# Patient Record
Sex: Female | Born: 1970 | Race: White | Hispanic: No | Marital: Single | Smoking: Current every day smoker
Health system: Southern US, Community
[De-identification: ages and names within clinical notes are randomized; demographics above are authoritative.]

## PROBLEM LIST (undated history)

## (undated) ENCOUNTER — Emergency Department (HOSPITAL_COMMUNITY): Discharge status: Against Medical Advice | Payer: PRIVATE HEALTH INSURANCE

## (undated) ENCOUNTER — Emergency Department (HOSPITAL_COMMUNITY): Payer: Self-pay | Source: Home / Self Care

## (undated) ENCOUNTER — Emergency Department (HOSPITAL_COMMUNITY): Disposition: A | Discharge status: Home / Self Care | Payer: Self-pay

## (undated) DIAGNOSIS — N200 Calculus of kidney: Secondary | ICD-10-CM

## (undated) DIAGNOSIS — F419 Anxiety disorder, unspecified: Secondary | ICD-10-CM

## (undated) DIAGNOSIS — F32A Depression, unspecified: Secondary | ICD-10-CM

## (undated) DIAGNOSIS — F1910 Other psychoactive substance abuse, uncomplicated: Secondary | ICD-10-CM

## (undated) DIAGNOSIS — F329 Major depressive disorder, single episode, unspecified: Secondary | ICD-10-CM

## (undated) DIAGNOSIS — M419 Scoliosis, unspecified: Secondary | ICD-10-CM

## (undated) DIAGNOSIS — C569 Malignant neoplasm of unspecified ovary: Secondary | ICD-10-CM

## (undated) DIAGNOSIS — F319 Bipolar disorder, unspecified: Secondary | ICD-10-CM

## (undated) DIAGNOSIS — M1990 Unspecified osteoarthritis, unspecified site: Secondary | ICD-10-CM

## (undated) DIAGNOSIS — M797 Fibromyalgia: Secondary | ICD-10-CM

## (undated) DIAGNOSIS — C801 Malignant (primary) neoplasm, unspecified: Secondary | ICD-10-CM

## (undated) HISTORY — DX: Other psychoactive substance abuse, uncomplicated: F19.10

## (undated) HISTORY — PX: ABDOMINAL HYSTERECTOMY: SHX81

## (undated) HISTORY — PX: TUBAL LIGATION: SHX77

---

## 1997-05-21 ENCOUNTER — Inpatient Hospital Stay (HOSPITAL_COMMUNITY): Admission: AD | Admit: 1997-05-21 | Discharge: 1997-05-21 | Payer: Self-pay | Admitting: *Deleted

## 1997-09-04 ENCOUNTER — Inpatient Hospital Stay (HOSPITAL_COMMUNITY): Admission: AD | Admit: 1997-09-04 | Discharge: 1997-09-04 | Payer: Self-pay | Admitting: *Deleted

## 1997-10-19 ENCOUNTER — Inpatient Hospital Stay (HOSPITAL_COMMUNITY): Admission: AD | Admit: 1997-10-19 | Discharge: 1997-10-23 | Payer: Self-pay | Admitting: Obstetrics & Gynecology

## 1997-11-16 ENCOUNTER — Encounter: Admission: RE | Admit: 1997-11-16 | Discharge: 1998-02-14 | Payer: Self-pay | Admitting: Obstetrics

## 1997-12-01 ENCOUNTER — Inpatient Hospital Stay (HOSPITAL_COMMUNITY): Admission: AD | Admit: 1997-12-01 | Discharge: 1997-12-01 | Payer: Self-pay | Admitting: *Deleted

## 1997-12-15 ENCOUNTER — Inpatient Hospital Stay (HOSPITAL_COMMUNITY): Admission: AD | Admit: 1997-12-15 | Discharge: 1997-12-15 | Payer: Self-pay | Admitting: *Deleted

## 1997-12-15 ENCOUNTER — Inpatient Hospital Stay (HOSPITAL_COMMUNITY): Admission: AD | Admit: 1997-12-15 | Discharge: 1997-12-15 | Payer: Self-pay | Admitting: Obstetrics

## 1997-12-21 ENCOUNTER — Inpatient Hospital Stay (HOSPITAL_COMMUNITY): Admission: AD | Admit: 1997-12-21 | Discharge: 1997-12-21 | Payer: Self-pay | Admitting: Obstetrics

## 1997-12-27 ENCOUNTER — Inpatient Hospital Stay (HOSPITAL_COMMUNITY): Admission: AD | Admit: 1997-12-27 | Discharge: 1997-12-29 | Payer: Self-pay | Admitting: *Deleted

## 1999-08-25 ENCOUNTER — Inpatient Hospital Stay (HOSPITAL_COMMUNITY): Admission: AD | Admit: 1999-08-25 | Discharge: 1999-08-25 | Payer: Self-pay | Admitting: *Deleted

## 2002-08-06 ENCOUNTER — Emergency Department (HOSPITAL_COMMUNITY): Admission: EM | Admit: 2002-08-06 | Discharge: 2002-08-06 | Payer: Self-pay | Admitting: Emergency Medicine

## 2005-07-11 ENCOUNTER — Emergency Department (HOSPITAL_COMMUNITY): Admission: EM | Admit: 2005-07-11 | Discharge: 2005-07-11 | Payer: Self-pay | Admitting: Emergency Medicine

## 2005-08-27 ENCOUNTER — Encounter: Admission: RE | Admit: 2005-08-27 | Discharge: 2005-08-27 | Payer: Self-pay | Admitting: Orthopedic Surgery

## 2005-11-28 ENCOUNTER — Emergency Department (HOSPITAL_COMMUNITY): Admission: EM | Admit: 2005-11-28 | Discharge: 2005-11-28 | Payer: Self-pay | Admitting: Emergency Medicine

## 2006-02-09 ENCOUNTER — Ambulatory Visit: Payer: Self-pay | Admitting: Pain Medicine

## 2006-02-18 ENCOUNTER — Ambulatory Visit: Payer: Self-pay | Admitting: Pain Medicine

## 2006-03-11 ENCOUNTER — Ambulatory Visit: Payer: Self-pay | Admitting: Pain Medicine

## 2006-03-24 ENCOUNTER — Ambulatory Visit: Payer: Self-pay | Admitting: Pain Medicine

## 2006-05-20 ENCOUNTER — Inpatient Hospital Stay (HOSPITAL_COMMUNITY): Admission: AD | Admit: 2006-05-20 | Discharge: 2006-05-21 | Payer: Self-pay | Admitting: Psychiatry

## 2006-05-20 ENCOUNTER — Ambulatory Visit: Payer: Self-pay | Admitting: Psychiatry

## 2006-10-25 ENCOUNTER — Emergency Department (HOSPITAL_COMMUNITY): Admission: EM | Admit: 2006-10-25 | Discharge: 2006-10-25 | Payer: Self-pay | Admitting: Emergency Medicine

## 2006-10-27 ENCOUNTER — Emergency Department (HOSPITAL_COMMUNITY): Admission: EM | Admit: 2006-10-27 | Discharge: 2006-10-27 | Payer: Self-pay | Admitting: Emergency Medicine

## 2006-10-29 ENCOUNTER — Emergency Department (HOSPITAL_COMMUNITY): Admission: EM | Admit: 2006-10-29 | Discharge: 2006-10-29 | Payer: Self-pay | Admitting: Emergency Medicine

## 2006-11-15 ENCOUNTER — Emergency Department (HOSPITAL_COMMUNITY): Admission: EM | Admit: 2006-11-15 | Discharge: 2006-11-15 | Payer: Self-pay | Admitting: Family Medicine

## 2007-01-03 ENCOUNTER — Emergency Department (HOSPITAL_COMMUNITY): Admission: EM | Admit: 2007-01-03 | Discharge: 2007-01-03 | Payer: Self-pay | Admitting: Family Medicine

## 2007-02-19 ENCOUNTER — Emergency Department (HOSPITAL_COMMUNITY): Admission: EM | Admit: 2007-02-19 | Discharge: 2007-02-19 | Payer: Self-pay | Admitting: Emergency Medicine

## 2007-03-12 ENCOUNTER — Encounter: Admission: RE | Admit: 2007-03-12 | Discharge: 2007-03-12 | Payer: Self-pay | Admitting: Sports Medicine

## 2007-05-06 ENCOUNTER — Emergency Department (HOSPITAL_COMMUNITY): Admission: EM | Admit: 2007-05-06 | Discharge: 2007-05-06 | Payer: Self-pay | Admitting: Emergency Medicine

## 2007-06-13 ENCOUNTER — Emergency Department (HOSPITAL_COMMUNITY): Admission: EM | Admit: 2007-06-13 | Discharge: 2007-06-13 | Payer: Self-pay | Admitting: Family Medicine

## 2007-09-29 ENCOUNTER — Emergency Department (HOSPITAL_COMMUNITY): Admission: EM | Admit: 2007-09-29 | Discharge: 2007-09-29 | Payer: Self-pay | Admitting: Emergency Medicine

## 2007-10-04 ENCOUNTER — Emergency Department (HOSPITAL_COMMUNITY): Admission: EM | Admit: 2007-10-04 | Discharge: 2007-10-04 | Payer: Self-pay | Admitting: Emergency Medicine

## 2007-10-11 ENCOUNTER — Emergency Department (HOSPITAL_COMMUNITY): Admission: EM | Admit: 2007-10-11 | Discharge: 2007-10-11 | Payer: Self-pay | Admitting: Emergency Medicine

## 2007-10-17 ENCOUNTER — Emergency Department (HOSPITAL_COMMUNITY): Admission: EM | Admit: 2007-10-17 | Discharge: 2007-10-17 | Payer: Self-pay | Admitting: Emergency Medicine

## 2007-11-10 ENCOUNTER — Emergency Department (HOSPITAL_COMMUNITY): Admission: EM | Admit: 2007-11-10 | Discharge: 2007-11-10 | Payer: Self-pay | Admitting: Emergency Medicine

## 2007-11-16 ENCOUNTER — Other Ambulatory Visit: Payer: Self-pay

## 2007-11-17 ENCOUNTER — Inpatient Hospital Stay (HOSPITAL_COMMUNITY): Admission: EM | Admit: 2007-11-17 | Discharge: 2007-11-18 | Payer: Self-pay | Admitting: Psychiatry

## 2007-11-17 ENCOUNTER — Ambulatory Visit: Payer: Self-pay | Admitting: Psychiatry

## 2007-12-03 ENCOUNTER — Emergency Department (HOSPITAL_COMMUNITY): Admission: EM | Admit: 2007-12-03 | Discharge: 2007-12-03 | Payer: Self-pay | Admitting: Family Medicine

## 2007-12-15 ENCOUNTER — Emergency Department (HOSPITAL_COMMUNITY): Admission: EM | Admit: 2007-12-15 | Discharge: 2007-12-15 | Payer: Self-pay | Admitting: Family Medicine

## 2007-12-16 ENCOUNTER — Emergency Department (HOSPITAL_COMMUNITY): Admission: EM | Admit: 2007-12-16 | Discharge: 2007-12-17 | Payer: Self-pay | Admitting: Internal Medicine

## 2007-12-18 ENCOUNTER — Emergency Department (HOSPITAL_COMMUNITY): Admission: EM | Admit: 2007-12-18 | Discharge: 2007-12-18 | Payer: Self-pay | Admitting: Emergency Medicine

## 2007-12-23 ENCOUNTER — Emergency Department (HOSPITAL_COMMUNITY): Admission: EM | Admit: 2007-12-23 | Discharge: 2007-12-23 | Payer: Self-pay | Admitting: Emergency Medicine

## 2008-01-18 ENCOUNTER — Emergency Department (HOSPITAL_COMMUNITY): Admission: EM | Admit: 2008-01-18 | Discharge: 2008-01-18 | Payer: Self-pay | Admitting: Emergency Medicine

## 2008-01-23 ENCOUNTER — Ambulatory Visit: Payer: Self-pay | Admitting: Cardiovascular Disease

## 2008-01-23 ENCOUNTER — Inpatient Hospital Stay (HOSPITAL_COMMUNITY): Admission: EM | Admit: 2008-01-23 | Discharge: 2008-01-27 | Payer: Self-pay | Admitting: Emergency Medicine

## 2008-01-24 ENCOUNTER — Encounter (INDEPENDENT_AMBULATORY_CARE_PROVIDER_SITE_OTHER): Payer: Self-pay | Admitting: Internal Medicine

## 2008-03-27 ENCOUNTER — Emergency Department (HOSPITAL_COMMUNITY): Admission: EM | Admit: 2008-03-27 | Discharge: 2008-03-27 | Payer: Self-pay | Admitting: Emergency Medicine

## 2008-04-24 ENCOUNTER — Emergency Department (HOSPITAL_COMMUNITY): Admission: EM | Admit: 2008-04-24 | Discharge: 2008-04-24 | Payer: Self-pay | Admitting: Emergency Medicine

## 2008-04-30 ENCOUNTER — Emergency Department (HOSPITAL_COMMUNITY): Admission: EM | Admit: 2008-04-30 | Discharge: 2008-04-30 | Payer: Self-pay | Admitting: Emergency Medicine

## 2008-05-12 ENCOUNTER — Emergency Department (HOSPITAL_COMMUNITY): Admission: EM | Admit: 2008-05-12 | Discharge: 2008-05-12 | Payer: Self-pay | Admitting: Emergency Medicine

## 2008-05-23 ENCOUNTER — Ambulatory Visit: Payer: Self-pay | Admitting: Surgery

## 2008-05-23 ENCOUNTER — Encounter (INDEPENDENT_AMBULATORY_CARE_PROVIDER_SITE_OTHER): Payer: Self-pay | Admitting: Emergency Medicine

## 2008-05-23 ENCOUNTER — Emergency Department (HOSPITAL_COMMUNITY): Admission: EM | Admit: 2008-05-23 | Discharge: 2008-05-24 | Payer: Self-pay | Admitting: Emergency Medicine

## 2008-06-05 ENCOUNTER — Other Ambulatory Visit: Payer: Self-pay | Admitting: Emergency Medicine

## 2008-06-05 ENCOUNTER — Ambulatory Visit: Payer: Self-pay | Admitting: Psychiatry

## 2008-06-06 ENCOUNTER — Inpatient Hospital Stay (HOSPITAL_COMMUNITY): Admission: RE | Admit: 2008-06-06 | Discharge: 2008-06-14 | Payer: Self-pay | Admitting: Psychiatry

## 2008-07-31 ENCOUNTER — Ambulatory Visit: Payer: Self-pay | Admitting: Cardiology

## 2008-07-31 ENCOUNTER — Inpatient Hospital Stay (HOSPITAL_COMMUNITY): Admission: EM | Admit: 2008-07-31 | Discharge: 2008-08-06 | Payer: Self-pay | Admitting: Emergency Medicine

## 2008-08-04 ENCOUNTER — Ambulatory Visit: Payer: Self-pay | Admitting: *Deleted

## 2008-08-06 ENCOUNTER — Inpatient Hospital Stay (HOSPITAL_COMMUNITY): Admission: RE | Admit: 2008-08-06 | Discharge: 2008-08-08 | Payer: Self-pay | Admitting: *Deleted

## 2008-08-14 ENCOUNTER — Emergency Department: Payer: Self-pay | Admitting: Internal Medicine

## 2008-11-04 ENCOUNTER — Emergency Department (HOSPITAL_COMMUNITY): Admission: EM | Admit: 2008-11-04 | Discharge: 2008-11-04 | Payer: Self-pay | Admitting: Emergency Medicine

## 2008-12-20 ENCOUNTER — Emergency Department (HOSPITAL_COMMUNITY): Admission: EM | Admit: 2008-12-20 | Discharge: 2008-12-20 | Payer: Self-pay | Admitting: Family Medicine

## 2008-12-27 ENCOUNTER — Emergency Department (HOSPITAL_COMMUNITY): Admission: EM | Admit: 2008-12-27 | Discharge: 2008-12-27 | Payer: Self-pay | Admitting: Emergency Medicine

## 2009-01-07 ENCOUNTER — Emergency Department (HOSPITAL_COMMUNITY): Admission: EM | Admit: 2009-01-07 | Discharge: 2009-01-07 | Payer: Self-pay | Admitting: Emergency Medicine

## 2009-03-06 ENCOUNTER — Emergency Department (HOSPITAL_COMMUNITY): Admission: EM | Admit: 2009-03-06 | Discharge: 2009-03-06 | Payer: Self-pay | Admitting: Emergency Medicine

## 2009-11-09 ENCOUNTER — Emergency Department (HOSPITAL_COMMUNITY): Admission: EM | Admit: 2009-11-09 | Discharge: 2009-11-09 | Payer: Self-pay | Admitting: Emergency Medicine

## 2009-11-10 ENCOUNTER — Emergency Department (HOSPITAL_COMMUNITY): Admission: EM | Admit: 2009-11-10 | Discharge: 2009-11-10 | Payer: Self-pay | Admitting: Emergency Medicine

## 2009-11-11 ENCOUNTER — Emergency Department (HOSPITAL_COMMUNITY): Admission: EM | Admit: 2009-11-11 | Discharge: 2009-11-12 | Payer: Self-pay | Admitting: Emergency Medicine

## 2009-11-11 ENCOUNTER — Emergency Department (HOSPITAL_COMMUNITY): Admission: EM | Admit: 2009-11-11 | Discharge: 2009-11-11 | Payer: Self-pay | Admitting: Emergency Medicine

## 2010-01-27 ENCOUNTER — Emergency Department (HOSPITAL_COMMUNITY): Admission: EM | Admit: 2010-01-27 | Discharge: 2010-01-27 | Payer: Self-pay | Admitting: Emergency Medicine

## 2010-03-31 ENCOUNTER — Encounter: Payer: Self-pay | Admitting: Orthopedic Surgery

## 2010-03-31 ENCOUNTER — Encounter: Payer: Self-pay | Admitting: Sports Medicine

## 2010-05-21 LAB — COMPREHENSIVE METABOLIC PANEL
AST: 25 U/L (ref 0–37)
BUN: 10 mg/dL (ref 6–23)
CO2: 28 mEq/L (ref 19–32)
Chloride: 107 mEq/L (ref 96–112)
Creatinine, Ser: 0.74 mg/dL (ref 0.4–1.2)
GFR calc non Af Amer: >60 mL/min (ref >60)
Total Bilirubin: 0.3 mg/dL (ref 0.3–1.2)

## 2010-05-21 LAB — DIFFERENTIAL
Basophils Absolute: 0 10*3/uL (ref 0.0–0.1)
Basophils Relative: 0 % (ref 0–1)
Eosinophils Absolute: 0.1 10*3/uL (ref 0.0–0.7)
Monocytes Absolute: 0.4 10*3/uL (ref 0.1–1.0)
Neutrophils Relative %: 64 % (ref 43–77)

## 2010-05-21 LAB — URINALYSIS, ROUTINE W REFLEX MICROSCOPIC
Ketones, ur: NEGATIVE mg/dL
Protein, ur: 30 mg/dL — AB
Urobilinogen, UA: 0.2 mg/dL (ref 0.0–1.0)

## 2010-05-21 LAB — CBC
HCT: 41.3 % (ref 36.0–46.0)
Hemoglobin: 14.6 g/dL (ref 12.0–15.0)
MCH: 32.9 pg (ref 26.0–34.0)
MCV: 93 fL (ref 78.0–100.0)
RBC: 4.44 MIL/uL (ref 3.87–5.11)

## 2010-05-21 LAB — URINE CULTURE
Colony Count: >=100000
Culture  Setup Time: 201111202357

## 2010-05-21 LAB — URINE MICROSCOPIC-ADD ON

## 2010-05-21 LAB — POCT PREGNANCY, URINE: Preg Test, Ur: NEGATIVE

## 2010-05-23 LAB — URINALYSIS, ROUTINE W REFLEX MICROSCOPIC
Bilirubin Urine: NEGATIVE
Glucose, UA: NEGATIVE mg/dL
Glucose, UA: NEGATIVE mg/dL
Ketones, ur: NEGATIVE mg/dL
Nitrite: NEGATIVE
Nitrite: POSITIVE — AB
Protein, ur: 30 mg/dL — AB
Protein, ur: NEGATIVE mg/dL
Protein, ur: NEGATIVE mg/dL
Specific Gravity, Urine: 1.012 (ref 1.005–1.030)
Specific Gravity, Urine: 1.018 (ref 1.005–1.030)
Urobilinogen, UA: 0.2 mg/dL (ref 0.0–1.0)
Urobilinogen, UA: 0.2 mg/dL (ref 0.0–1.0)
Urobilinogen, UA: 0.2 mg/dL (ref 0.0–1.0)
pH: 5 (ref 5.0–8.0)
pH: 6 (ref 5.0–8.0)

## 2010-05-23 LAB — CBC
HCT: 37.5 % (ref 36.0–46.0)
HCT: 38.6 % (ref 36.0–46.0)
Hemoglobin: 12.8 g/dL (ref 12.0–15.0)
Hemoglobin: 13.8 g/dL (ref 12.0–15.0)
MCH: 31.5 pg (ref 26.0–34.0)
MCH: 32.6 pg (ref 26.0–34.0)
MCH: 32.7 pg (ref 26.0–34.0)
MCHC: 33.2 g/dL (ref 30.0–36.0)
MCHC: 34.7 g/dL (ref 30.0–36.0)
MCV: 93.7 fL (ref 78.0–100.0)
MCV: 95.1 fL (ref 78.0–100.0)
Platelets: 278 10*3/uL (ref 150–400)
Platelets: 285 10*3/uL (ref 150–400)
RBC: 4.21 MIL/uL (ref 3.87–5.11)
RDW: 11.9 % (ref 11.5–15.5)
RDW: 11.9 % (ref 11.5–15.5)
WBC: 10.7 10*3/uL — ABNORMAL HIGH (ref 4.0–10.5)

## 2010-05-23 LAB — POCT I-STAT, CHEM 8
Chloride: 106 mEq/L (ref 96–112)
Creatinine, Ser: 1.1 mg/dL (ref 0.4–1.2)
HCT: 45 % (ref 36.0–46.0)
Hemoglobin: 15.3 g/dL — ABNORMAL HIGH (ref 12.0–15.0)
Potassium: 3.4 mEq/L — ABNORMAL LOW (ref 3.5–5.1)
Sodium: 142 mEq/L (ref 135–145)

## 2010-05-23 LAB — DIFFERENTIAL
Basophils Absolute: 0.1 10*3/uL (ref 0.0–0.1)
Basophils Relative: 1 % (ref 0–1)
Eosinophils Absolute: 0.1 10*3/uL (ref 0.0–0.7)
Eosinophils Absolute: 0.2 10*3/uL (ref 0.0–0.7)
Lymphocytes Relative: 20 % (ref 12–46)
Lymphs Abs: 2.3 10*3/uL (ref 0.7–4.0)
Lymphs Abs: 3.5 10*3/uL (ref 0.7–4.0)
Monocytes Relative: 6 % (ref 3–12)
Monocytes Relative: 8 % (ref 3–12)
Neutro Abs: 3.7 10*3/uL (ref 1.7–7.7)
Neutro Abs: 7.4 10*3/uL (ref 1.7–7.7)
Neutro Abs: 8.1 10*3/uL — ABNORMAL HIGH (ref 1.7–7.7)
Neutrophils Relative %: 46 % (ref 43–77)
Neutrophils Relative %: 69 % (ref 43–77)
Neutrophils Relative %: 73 % (ref 43–77)

## 2010-05-23 LAB — URINE MICROSCOPIC-ADD ON

## 2010-05-23 LAB — POCT CARDIAC MARKERS
CKMB, poc: <1 ng/mL — ABNORMAL LOW (ref 1.0–8.0)
Myoglobin, poc: 58.9 ng/mL (ref 12–200)

## 2010-05-23 LAB — COMPREHENSIVE METABOLIC PANEL
Alkaline Phosphatase: 48 U/L (ref 39–117)
BUN: 13 mg/dL (ref 6–23)
BUN: 5 mg/dL — ABNORMAL LOW (ref 6–23)
CO2: 23 mEq/L (ref 19–32)
Calcium: 8.5 mg/dL (ref 8.4–10.5)
Calcium: 8.7 mg/dL (ref 8.4–10.5)
Creatinine, Ser: 0.83 mg/dL (ref 0.4–1.2)
Creatinine, Ser: 0.84 mg/dL (ref 0.4–1.2)
GFR calc non Af Amer: >60 mL/min (ref >60)
Glucose, Bld: 105 mg/dL — ABNORMAL HIGH (ref 70–99)
Glucose, Bld: 81 mg/dL (ref 70–99)
Potassium: 3.6 mEq/L (ref 3.5–5.1)
Total Protein: 6.4 g/dL (ref 6.0–8.3)
Total Protein: 7.3 g/dL (ref 6.0–8.3)

## 2010-05-23 LAB — RAPID URINE DRUG SCREEN, HOSP PERFORMED
Amphetamines: NOT DETECTED
Benzodiazepines: POSITIVE — AB
Cocaine: POSITIVE — AB
Opiates: POSITIVE — AB
Tetrahydrocannabinol: NOT DETECTED

## 2010-05-23 LAB — URINE CULTURE: Culture: NO GROWTH

## 2010-05-23 LAB — TRICYCLICS SCREEN, URINE: TCA Scrn: NOT DETECTED

## 2010-05-23 LAB — PREGNANCY, URINE: Preg Test, Ur: NEGATIVE

## 2010-06-10 LAB — URINALYSIS, ROUTINE W REFLEX MICROSCOPIC
Nitrite: NEGATIVE
Specific Gravity, Urine: 1.025 (ref 1.005–1.030)
Urobilinogen, UA: 0.2 mg/dL (ref 0.0–1.0)

## 2010-06-10 LAB — DIFFERENTIAL
Basophils Absolute: 0 10*3/uL (ref 0.0–0.1)
Lymphocytes Relative: 17 % (ref 12–46)
Neutro Abs: 3.8 10*3/uL (ref 1.7–7.7)
Neutrophils Relative %: 70 % (ref 43–77)

## 2010-06-10 LAB — COMPREHENSIVE METABOLIC PANEL
BUN: 12 mg/dL (ref 6–23)
CO2: 23 mEq/L (ref 19–32)
Chloride: 105 mEq/L (ref 96–112)
Creatinine, Ser: 0.92 mg/dL (ref 0.4–1.2)
GFR calc non Af Amer: >60 mL/min (ref >60)
Total Bilirubin: 0.3 mg/dL (ref 0.3–1.2)

## 2010-06-10 LAB — URINE MICROSCOPIC-ADD ON

## 2010-06-10 LAB — CBC
HCT: 42.7 % (ref 36.0–46.0)
MCV: 92.6 fL (ref 78.0–100.0)
RBC: 4.61 MIL/uL (ref 3.87–5.11)
WBC: 5.4 10*3/uL (ref 4.0–10.5)

## 2010-06-10 LAB — LIPASE, BLOOD: Lipase: 28 U/L (ref 11–59)

## 2010-06-13 LAB — URINE MICROSCOPIC-ADD ON

## 2010-06-13 LAB — URINALYSIS, ROUTINE W REFLEX MICROSCOPIC
Glucose, UA: NEGATIVE mg/dL
Ketones, ur: 15 mg/dL — AB
Leukocytes, UA: NEGATIVE
pH: 6 (ref 5.0–8.0)

## 2010-06-13 LAB — COMPREHENSIVE METABOLIC PANEL
ALT: 18 U/L (ref 0–35)
AST: 26 U/L (ref 0–37)
Albumin: 4.6 g/dL (ref 3.5–5.2)
Alkaline Phosphatase: 77 U/L (ref 39–117)
GFR calc Af Amer: >60 mL/min (ref >60)
Potassium: 3.9 mEq/L (ref 3.5–5.1)
Sodium: 137 mEq/L (ref 135–145)
Total Protein: 8.7 g/dL — ABNORMAL HIGH (ref 6.0–8.3)

## 2010-06-13 LAB — DIFFERENTIAL
Basophils Relative: 1 % (ref 0–1)
Eosinophils Absolute: 0 10*3/uL (ref 0.0–0.7)
Monocytes Absolute: 0.3 10*3/uL (ref 0.1–1.0)
Neutro Abs: 6.5 10*3/uL (ref 1.7–7.7)

## 2010-06-13 LAB — CBC
Hemoglobin: 16.4 g/dL — ABNORMAL HIGH (ref 12.0–15.0)
Platelets: 262 10*3/uL (ref 150–400)
RDW: 12.4 % (ref 11.5–15.5)

## 2010-06-15 LAB — URINALYSIS, ROUTINE W REFLEX MICROSCOPIC
Bilirubin Urine: NEGATIVE
Glucose, UA: NEGATIVE mg/dL
Ketones, ur: NEGATIVE mg/dL
Specific Gravity, Urine: 1.022 (ref 1.005–1.030)
pH: 8 (ref 5.0–8.0)

## 2010-06-15 LAB — POCT PREGNANCY, URINE: Preg Test, Ur: NEGATIVE

## 2010-06-18 LAB — COMPREHENSIVE METABOLIC PANEL
ALT: 13 U/L (ref 0–35)
AST: 18 U/L (ref 0–37)
Albumin: 4.3 g/dL (ref 3.5–5.2)
Calcium: 9.4 mg/dL (ref 8.4–10.5)
GFR calc Af Amer: >60 mL/min (ref >60)
Sodium: 139 mEq/L (ref 135–145)
Total Protein: 7.1 g/dL (ref 6.0–8.3)

## 2010-06-18 LAB — CBC
HCT: 36.8 % (ref 36.0–46.0)
MCHC: 34.2 g/dL (ref 30.0–36.0)
MCHC: 35.8 g/dL (ref 30.0–36.0)
MCV: 93.8 fL (ref 78.0–100.0)
MCV: 94.5 fL (ref 78.0–100.0)
Platelets: 187 10*3/uL (ref 150–400)
Platelets: 277 10*3/uL (ref 150–400)
RBC: 3.62 MIL/uL — ABNORMAL LOW (ref 3.87–5.11)
RBC: 3.9 MIL/uL (ref 3.87–5.11)
RBC: 4.31 MIL/uL (ref 3.87–5.11)
RDW: 12.4 % (ref 11.5–15.5)
WBC: 7.5 10*3/uL (ref 4.0–10.5)
WBC: 8.8 10*3/uL (ref 4.0–10.5)

## 2010-06-18 LAB — CARDIAC PANEL(CRET KIN+CKTOT+MB+TROPI)
CK, MB: 0.4 ng/mL (ref 0.3–4.0)
CK, MB: 5.2 ng/mL — ABNORMAL HIGH (ref 0.3–4.0)
Relative Index: 0.6 (ref 0.0–2.5)
Relative Index: 0.8 (ref 0.0–2.5)
Relative Index: INVALID (ref 0.0–2.5)
Relative Index: INVALID (ref 0.0–2.5)
Total CK: 889 U/L — ABNORMAL HIGH (ref 7–177)
Total CK: 989 U/L — ABNORMAL HIGH (ref 7–177)
Troponin I: 0.01 ng/mL (ref 0.00–0.06)
Troponin I: <0.01 ng/mL (ref 0.00–0.06)
Troponin I: <0.01 ng/mL (ref 0.00–0.06)
Troponin I: <0.01 ng/mL (ref 0.00–0.06)
Troponin I: <0.01 ng/mL (ref 0.00–0.06)

## 2010-06-18 LAB — DIFFERENTIAL
Eosinophils Absolute: 0 10*3/uL (ref 0.0–0.7)
Eosinophils Relative: 0 % (ref 0–5)
Lymphocytes Relative: 27 % (ref 12–46)
Lymphs Abs: 2.5 10*3/uL (ref 0.7–4.0)
Monocytes Relative: 3 % (ref 3–12)

## 2010-06-18 LAB — BASIC METABOLIC PANEL
BUN: 11 mg/dL (ref 6–23)
BUN: 9 mg/dL (ref 6–23)
CO2: 28 mEq/L (ref 19–32)
Calcium: 8.5 mg/dL (ref 8.4–10.5)
Calcium: 8.8 mg/dL (ref 8.4–10.5)
Creatinine, Ser: 0.45 mg/dL (ref 0.4–1.2)
Creatinine, Ser: 0.79 mg/dL (ref 0.4–1.2)
GFR calc Af Amer: >60 mL/min (ref >60)
GFR calc Af Amer: >60 mL/min (ref >60)
GFR calc non Af Amer: >60 mL/min (ref >60)

## 2010-06-18 LAB — URINALYSIS, ROUTINE W REFLEX MICROSCOPIC
Bilirubin Urine: NEGATIVE
Ketones, ur: NEGATIVE mg/dL
Nitrite: NEGATIVE
pH: 7 (ref 5.0–8.0)

## 2010-06-18 LAB — LIPID PANEL
HDL: 40 mg/dL (ref >39)
Triglycerides: 57 mg/dL (ref <150)
VLDL: 11 mg/dL (ref 0–40)

## 2010-06-18 LAB — ACETAMINOPHEN LEVEL: Acetaminophen (Tylenol), Serum: <10 ug/mL — ABNORMAL LOW (ref 10–30)

## 2010-06-18 LAB — POCT CARDIAC MARKERS: Troponin i, poc: <0.05 ng/mL (ref 0.00–0.09)

## 2010-06-18 LAB — RAPID URINE DRUG SCREEN, HOSP PERFORMED
Amphetamines: NOT DETECTED
Cocaine: POSITIVE — AB
Tetrahydrocannabinol: POSITIVE — AB

## 2010-06-18 LAB — TSH: TSH: 1.627 u[IU]/mL (ref 0.350–4.500)

## 2010-06-19 LAB — ELECTROLYTE PANEL
CO2: 32 mEq/L (ref 19–32)
Potassium: 4.1 mEq/L (ref 3.5–5.1)
Sodium: 138 mEq/L (ref 135–145)

## 2010-06-20 LAB — D-DIMER, QUANTITATIVE: D-Dimer, Quant: <0.22 ug/mL-FEU (ref 0.00–0.48)

## 2010-06-20 LAB — COMPREHENSIVE METABOLIC PANEL
ALT: 11 U/L (ref 0–35)
AST: 15 U/L (ref 0–37)
Albumin: 4.1 g/dL (ref 3.5–5.2)
CO2: 30 mEq/L (ref 19–32)
Calcium: 9.1 mg/dL (ref 8.4–10.5)
Chloride: 107 mEq/L (ref 96–112)
GFR calc Af Amer: >60 mL/min (ref >60)
GFR calc non Af Amer: >60 mL/min (ref >60)
Sodium: 144 mEq/L (ref 135–145)
Total Bilirubin: 0.5 mg/dL (ref 0.3–1.2)

## 2010-06-20 LAB — DIFFERENTIAL
Basophils Absolute: 0 10*3/uL (ref 0.0–0.1)
Eosinophils Absolute: 0.1 10*3/uL (ref 0.0–0.7)
Eosinophils Absolute: 0.1 10*3/uL (ref 0.0–0.7)
Eosinophils Relative: 1 % (ref 0–5)
Eosinophils Relative: 1 % (ref 0–5)
Lymphocytes Relative: 44 % (ref 12–46)
Lymphs Abs: 2.7 10*3/uL (ref 0.7–4.0)
Monocytes Absolute: 0.6 10*3/uL (ref 0.1–1.0)
Neutrophils Relative %: 49 % (ref 43–77)

## 2010-06-20 LAB — TYPE AND SCREEN
ABO/RH(D): O POS
Antibody Screen: NEGATIVE

## 2010-06-20 LAB — CBC
HCT: 37.9 % (ref 36.0–46.0)
Platelets: 200 10*3/uL (ref 150–400)
Platelets: 278 10*3/uL (ref 150–400)
RBC: 4.17 MIL/uL (ref 3.87–5.11)
RDW: 12.2 % (ref 11.5–15.5)
WBC: 6.2 10*3/uL (ref 4.0–10.5)
WBC: 10.3 10*3/uL (ref 4.0–10.5)

## 2010-06-20 LAB — POCT I-STAT, CHEM 8
BUN: 18 mg/dL (ref 6–23)
Calcium, Ion: 1.2 mmol/L (ref 1.12–1.32)
Chloride: 107 mEq/L (ref 96–112)
HCT: 40 % (ref 36.0–46.0)
Potassium: 3.3 mEq/L — ABNORMAL LOW (ref 3.5–5.1)
Sodium: 141 mEq/L (ref 135–145)

## 2010-06-20 LAB — RAPID URINE DRUG SCREEN, HOSP PERFORMED: Tetrahydrocannabinol: POSITIVE — AB

## 2010-06-20 LAB — SALICYLATE LEVEL: Salicylate Lvl: <4 mg/dL (ref 2.8–20.0)

## 2010-06-20 LAB — GLUCOSE, CAPILLARY

## 2010-06-20 LAB — PROTIME-INR
INR: 1.1 (ref 0.00–1.49)
Prothrombin Time: 14.6 seconds (ref 11.6–15.2)

## 2010-06-20 LAB — ACETAMINOPHEN LEVEL: Acetaminophen (Tylenol), Serum: <10 ug/mL — ABNORMAL LOW (ref 10–30)

## 2010-06-24 LAB — CBC
HCT: 43.8 % (ref 36.0–46.0)
Hemoglobin: 14.7 g/dL (ref 12.0–15.0)
MCHC: 33.6 g/dL (ref 30.0–36.0)
MCV: 93.4 fL (ref 78.0–100.0)
Platelets: 249 10*3/uL (ref 150–400)
RBC: 4.69 MIL/uL (ref 3.87–5.11)
RDW: 12.9 % (ref 11.5–15.5)
WBC: 7.3 10*3/uL (ref 4.0–10.5)

## 2010-06-24 LAB — URINALYSIS, ROUTINE W REFLEX MICROSCOPIC
Bilirubin Urine: NEGATIVE
Nitrite: NEGATIVE
Specific Gravity, Urine: 1.014 (ref 1.005–1.030)
Urobilinogen, UA: 0.2 mg/dL (ref 0.0–1.0)

## 2010-06-24 LAB — DIFFERENTIAL
Basophils Relative: 0 % (ref 0–1)
Eosinophils Absolute: 0 10*3/uL (ref 0.0–0.7)
Neutrophils Relative %: 62 % (ref 43–77)

## 2010-06-24 LAB — POCT I-STAT, CHEM 8
Glucose, Bld: 107 mg/dL — ABNORMAL HIGH (ref 70–99)
HCT: 45 % (ref 36.0–46.0)
Hemoglobin: 15.3 g/dL — ABNORMAL HIGH (ref 12.0–15.0)
Potassium: 3.1 mEq/L — ABNORMAL LOW (ref 3.5–5.1)
Sodium: 141 mEq/L (ref 135–145)

## 2010-06-24 LAB — URINE MICROSCOPIC-ADD ON

## 2010-06-24 LAB — POCT PREGNANCY, URINE: Preg Test, Ur: NEGATIVE

## 2010-06-25 LAB — POCT I-STAT, CHEM 8
BUN: 10 mg/dL (ref 6–23)
Calcium, Ion: 1.15 mmol/L (ref 1.12–1.32)
Chloride: 103 mEq/L (ref 96–112)
Potassium: 3.9 mEq/L (ref 3.5–5.1)
Sodium: 142 mEq/L (ref 135–145)

## 2010-06-25 LAB — POCT PREGNANCY, URINE: Preg Test, Ur: NEGATIVE

## 2010-06-25 LAB — URINALYSIS, ROUTINE W REFLEX MICROSCOPIC
Glucose, UA: NEGATIVE mg/dL
Hgb urine dipstick: NEGATIVE
Protein, ur: NEGATIVE mg/dL
pH: 5.5 (ref 5.0–8.0)

## 2010-07-23 NOTE — H&P (Signed)
NAMEMarland Kitchen  Courtney [name], Courtney [name] NO.:  [id number]   MEDICAL RECORD NO.:  [id number]          PATIENT TYPE:  IPS   LOCATION:  0506                          FACILITY:  [facility]   PHYSICIAN:  Geoffery Lyons, M.D.      DATE OF BIRTH:  [birth year]   DATE OF ADMISSION:  11/17/2007  DATE OF DISCHARGE:                       PSYCHIATRIC ADMISSION ASSESSMENT   PATIENT IDENTIFICATION:  This is a 40 year old female voluntarily  admitted on 11/17/2007.   HISTORY OF PRESENT ILLNESS:  The patient presents with a history of  opiate addiction, had been using Percocet, oxycodone, Vicodin, taking up  to 30 pills a day, has been using for 4 years, also recently has been  snorting heroin in addition to taking the pain pills.  She states that  initially they were prescribed, but her pain medication would be gone in  approximately a week after a 30-day prescription.  She was trying to use  heroin to come off of her pain pills, but was unsuccessful.  Her longest  history of sobriety has been 6 weeks.  Feeling depressed, wanting to get  help.  Denies any suicidal thoughts.   PAST PSYCHIATRIC HISTORY:  First admission to [facility].  No prior treatment.  No current outpatient mental health treatment.   SOCIAL HISTORY:  She is a married female, lives in [place].  She has  four children ages 62, 44, 71, 73.  The patient lives with her two  younger children.   FAMILY HISTORY:  None.   ALCOHOL AND DRUG HABITS:  Denies any alcohol use.  Also abusing  cannabis.  Denies any IV drug use.   PRIMARY CARE Mandee Pluta:  None.   MEDICAL PROBLEMS:  Chronic pain   MEDICATIONS:  1. Klonopin 1 mg t.i.d.  2. Effexor XR 75 mg.  3. Ambien CR for sleep.  4. Vicodin.   DRUG ALLERGIES:  PENICILLIN LISTED AS AN ANAPHYLACTIC REACTION.   PHYSICAL EXAMINATION:  GENERAL:  This is a young female who was fully  assessed at [facility] emergency department.  She did receive Zofran IV  fluids and Ativan 2  mg,.  VITAL SIGNS:  Temperature 99.2, 60 heart rate, 40 respirations, blood  pressure is 106/72, 5 feet 2 inches tall 121 pounds.   LABORATORY DATA:  Shows alcohol level less than 5.  CBC within normal  limits.  Urine drug screen positive for opiates, positive for THC.  Pregnancy test is negative.  Urinalysis is negative.   MENTAL STATUS EXAM:  She is fully alert, cooperative, fair eye contact,  casually dressed.  Speech is clear, normal pace and tone.  Thought  processes are coherent and goal directed.  Her affect is appropriate to  mood, having some withdrawal symptoms.  Cognitive function intact.  Memory is good.  Judgment insight is fair.   DIAGNOSES:  AXIS I:  Opiate dependence.  Cannabis abuse.  Depressive  disorder NOS.  AXIS II:  Deferred.  AXIS III:  Chronic pain.  AXIS IV:  Medical problems, possible psychosocial problems.  AXIS V:  45.   PLANS:  Contract for safety.  Put  patient on the clonidine protocol.  We  will continue with patient's Effexor.  Will have trazodone for sleep.  The patient this time is interested in not going to a long-term program.  Needs to get back home to be with her children.  We will continue to  assess the patient's rehab.  Consider family session.  The patient will  be in the red group.  Her tentative length of stay at this time is 3-5  days.      Landry Corporal, N.P.      Geoffery Lyons, M.D.  Electronically Signed    JO/MEDQ  D:  11/17/2007  T:  11/17/2007  Job:  [id number]
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
## 2010-07-23 NOTE — Consult Note (Signed)
NAME:  Courtney [name], Courtney [name] NO.:  [id number]   MEDICAL RECORD NO.:  [id number]          PATIENT TYPE:  INP   LOCATION:                               FACILITY:  [facility]   PHYSICIAN:  Rollene Rotunda, MD, FACCDATE OF BIRTH:  1970-03-22   DATE OF CONSULTATION:  08/05/2008  DATE OF DISCHARGE:  08/06/2008                                 CONSULTATION   REQUESTING PHYSICIAN:  Dr. Derenda Mis   REASON FOR CONSULTATION:  Evaluate patient with chest discomfort.   HISTORY OF PRESENT ILLNESS:  The patient is 40 years old.  She has no  history of cardiac disease.  She does have a history of chest  discomfort.  She had a stress perfusion study in November 2009.  At that  time, she had no evidence of ischemia with an EF of 57%.  She also had  an echo that demonstrated no wall motion abnormalities, no valvular  abnormalities and normal left ventricular function.  She did have a  probable remnant of ductus arteriosus which was slightly calcified.  This prompted a chest CT.  There was no evidence of dissection during  that hospitalization or pulmonary embolism.   She now presents with suicidal ideation and depression.  She has also  been complaining of chest discomfort.  On admission, she was noted to be  positive for cocaine and marijuana.  She is to be transferred to the  [facility].  However, she has been complaining of chest  discomfort.  She said this really started the day before admission.  She  says it has been going on more intensely today.  It has been all day  long.  It has been constant.  It peaks at an 8/10.  It is sharp and  radiates from under her left breast to the middle of her chest.  It does  go down to her shoulder on the left side into her elbow.  It is similar  to the pain she had back in November 2009 though more intense.  It is  not associated with diaphoresis or vomiting, though she has been  somewhat nauseated.  She has been able to eat and has  a [employer] back  on her table.  She has had no acute shortness of breath, no PND or  orthopnea.  She has had no tachy arrhythmias on monitor.  EKG  demonstrates no acute ST-T wave changes.  She does report the pain does  change slightly with movement or with deep breathing.  Of note, cardiac  enzymes have been negative x5.   PAST MEDICAL HISTORY:  Borderline hypertension.  She has no history of  hyperlipidemia or diabetes.  She has a history of substance abuse,  depression and anxiety.   PAST SURGICAL HISTORY:  Hysterectomy.   ALLERGIES:  PENICILLIN.   MEDICATIONS ON ADMISSION:  1. Adderall 30 mg daily.  2. Alprazolam 0.5 mg t.i.d.  3. Lexapro.  4. Depakote ER 100 mg daily.  5. Trazodone 150 mg at bedtime.  6. Seroquel 100 mg t.i.d.  7. Multivitamin.  8. Percocet.  SOCIAL HISTORY:  The patient is married.  She says she smokes 1/2 pack  of cigarettes a day and has done so since age 78.  She has 4 children.   FAMILY HISTORY:  Contributory for her father having bypass at age 69.   REVIEW OF SYSTEMS:  Positive for low back pain.  Otherwise, as stated in  HPI negative for all other systems.   PHYSICAL EXAMINATION:  GENERAL:  The patient is in no distress.  VITAL SIGNS:  Blood pressure 126/65, heart rate 90s and regular, 97%  saturation on room air, afebrile, respiratory rate 18.  HEENT:  Eyes are  unremarkable.  Pupils equal, round and reactive to light.  Fundi not  visualized.  Oral mucosa unremarkable.  NECK:  No jugular distention at  45 degrees, carotid upstroke brisk and symmetrical.  No bruits, no  thyromegaly.  LYMPHATICS:  No cervical, axillary, inguinal adenopathy.  LUNGS:  Clear to auscultation bilaterally.  BACK:  No costovertebral angle tenderness.  CHEST:  Mild tenderness to palpation at the sternum.  HEART:  PMI not displaced or sustained, S1 and S2 within normal limits,  no S3, no S4, no clicks, no rubs, no murmurs.  ABDOMEN:  Flat, positive  bowel sounds  normal in frequency and pitch, no bruits, no rebound, no  guarding, no midline pulsatile mass, no hepatomegaly, no splenomegaly.  SKIN:  No rashes, no nodules.  EXTREMITIES:  There are 2+ pulses throughout, no edema, no cyanosis, no  clubbing.  NEURO:  Oriented to person, place, time.  Cranial nerves II-XII grossly  intact, motor grossly intact.   EKG:  Sinus rhythm, rate 92, axis within normal limits, intervals within  normal limits, poor anterior R-wave progression, no acute ST-T wave  changes.   LABORATORIES:  Sodium 138, potassium 4.1, BUN 9, creatinine 0.45, WBC  8.8, hemoglobin 11.3, platelets 187, TSH 1.627.  As stated, cardiac  markers negative x5.   ASSESSMENT/PLAN:  1. The patient's chest discomfort is very unlikely to be cardiac.  She      has tobacco and family history as risk factors.  However, she has a      normal physical exam without any evidence of vascular disease.  The      pain is reproducible to some extent with palpation.  It is the same      pain that she described in November 2009, and, at that time, she      had a negative stress perfusion study.  Her EKG demonstrates no      acute abnormalities.  Given all of this, the pretest probability of      obstructive coronary disease as the etiology of her pain is      extremely low.  Further noninvasive or invasive cardiac testing is      not indicated.  This pain may be musculoskeletal or      gastrointestinal.  Pain management and further evaluation are per      her primary team.  2. Tobacco.  We did discuss the need to stop smoking.  3. Dyslipidemia.  She does have an LDL of 133 and HDL of 40.  However,      she has a [employer] bag sitting in the hospital room.  Certainly,      the first necessary intervention would be dietary changes.      Rollene Rotunda, MD, [facility]  Electronically Signed     JH/MEDQ  D:  08/05/2008  T:  08/05/2008  Job:  [id number] 
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
## 2010-07-23 NOTE — H&P (Signed)
NAME:  Courtney [name], Courtney [name]                ACCOUNT NO.:  [id number]   MEDICAL RECORD NO.:  [id number]          PATIENT TYPE:  INP   LOCATION:  1845                         FACILITY:  [facility]   PHYSICIAN:  Michelene Gardener, MD    DATE OF BIRTH:  1970-12-27   DATE OF ADMISSION:  01/23/2008  DATE OF DISCHARGE:                              HISTORY & PHYSICAL   PRIMARY PHYSICIAN:  Lonia Blood, M.D.   CHIEF COMPLAINT:  Increasing chest pain since Friday.   HISTORY OF PRESENT ILLNESS:  This is a 40 year old Caucasian female with  past medical history of anxiety and tobacco abuse, presents with the  above-mentioned complaint.  Condition started on Friday.  At that time  the patient was feeling chest pain mainly to the left side of her chest.  Pain was described as soreness at that time with some radiation to her  shoulder.  She came to the ER on that day and she was evaluated and she  was sent home with diagnosis of musculoskeletal pain.  She continued to  have this pain on and off through the weekend and she came to the  hospital today because her pain is worsening.  Currently pain was  described as left-sided, felt pressure like, moving to her left shoulder  and to her back.  Associated with shortness of breath and nausea.  She  vomited one to two times.  She was also having tingling in her left arm.   PAST MEDICAL HISTORY:  Significant for:  1. Anxiety.  2. Depression.   PAST SURGICAL HISTORY:  Denied.   CURRENT MEDICATIONS:  1. Clonidine 0.5 mg 3 times daily.  2. The patient was on Effexor and that was stopped because she cannot      tolerate it.   ALLERGIES:  No known drug allergies.   SOCIAL HISTORY:  She smokes half pack per day and she has been smoking  for around 20 years.  She denies alcohol drinking.  She denies  recreational drugs.   FAMILY HISTORY:  Her father had history of coronary artery disease when  he developed a massive heart attack at age 14.  Grandmother had history  of coronary artery disease with CABG.  Grandfather has history of  coronary artery disease.  Both sides have history of diabetes.  Both  sides have history of depression.   REVIEW OF SYSTEMS:  CONSTITUTIONAL:  Positive for fatigability and  sweating, loss of energy.  EYES:  No blurred vision.  ENT: No tinnitus.  RESPIRATORY: No cough, no wheezes.  CARDIOVASCULAR: Positive for chest  pain and mild shortness of breath.  GI: Positive for nausea and  vomiting.  There is no abdominal pain.  GU: No dysuria, hematuria,  polyuria, no nocturia.  HEMATOLOGY:  No bruises, no bleeding.  ID:  No  rash, no lesions.  NEURO:  No numbness or tingling.  Rest of systems  reviewed and they were negative.   PHYSICAL EXAMINATION:  Vital Signs:  Temperature 98.1, blood pressure is  135/82, pulse is 82, respiratory rate is 16.  GENERAL APPEARANCE:  This is  a young Caucasian female not in acute  distress.  HEENT: Her conjunctivae are pink.  Pupils are equal and reactive to  light and accommodation.  There is no ptosis.  Hearing is intact.  There  is no ear discharge or infection.  There is no nose infection or  bleeding.  Oral mucosa is  dry.  No pharyngeal erythema.  NECK:  Is supple.  No JVD, no carotid bruit, no thyroid enlargement or  thyroid tenderness.  CARDIOVASCULAR EXAMINATION:  S1, S2 regular.  There are no murmurs, no  gallops and no thrills.  RESPIRATORY EXAMINATION:  The patient is breathing between 16-18.  There  are no rales, no rhonchi, no wheezes.  The patient has positive  tenderness on palpation in the left side of her chest.  ABDOMEN:  Soft, not distended.  No tenderness, no hepatosplenomegaly.  Bowel sounds are present.  LOWER EXTREMITIES:  No edema, no rash and no varicose veins.  SKIN:  No rash and erythema.  NEURO:  Cranial nerves are intact from II-XII.  There are no motor or  sensory deficits.   LABORATORY RESULTS:  Urinalysis is negative for UTI.  Urine toxicology  is positive  for benzodiazepines, opiates, amphetamines, barbiturates and  marijuana.  Pregnancy test is negative.  CK-MB is less than one,  troponin less than 0.05.  Hemoglobin 16.0, hematocrit 47.0, sodium 142,  potassium 3.6, chloride 100, bicarb 28, glucose 132, BUN 10, creatinine  1.0   IMPRESSION:  1. Chest pain.  2. Ongoing tobacco abuse.  3. Anxiety.  4. History of heart murmur.  5. Multi-drug abuse.   PLAN:  1. Chest pain.  This patient has been complaining of chest pain since      Friday that has been atypical.  The patient's risk factors include      ongoing tobacco abuse in addition to strong family history with her      father had massive heart attack at age of 71.  On examination she      had tenderness around her chest pain which would make it most      likely musculoskeletal.  Because of her significant family history,      I will admit her to the hospital.  We will get three sets of      troponins and cardiac enzymes.  We will monitor her on telemetry.      We will repeat her EKG tomorrow.  Depending on her progression we      will consider Cardiology evaluation for stratification of her risk      factors.  I will start her on aspirin, Lovenox and a small dose of      beta blocker.  2. Ongoing tobacco abuse.  The patient was advised about the risks.  3. Anxiety.  I will continue her clonidine as ordered by her primary      doctor.  4. Multi-Drug use: The pt was adviced about the risks.   Assessment time is 1 hour.      Michelene Gardener, MD  Electronically Signed     NAE/MEDQ  D:  01/23/2008  T:  01/23/2008  Job:  [id number]   cc:   Lonia Blood, M.D.
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
## 2010-07-23 NOTE — Discharge Summary (Signed)
NAME:  Courtney [name], Courtney [name] NO.:  [id number]   MEDICAL RECORD NO.:  [id number]          PATIENT TYPE:  INP   LOCATION:  1509                         FACILITY:  [facility]   PHYSICIAN:  Eduard Clos, MDDATE OF BIRTH:  1971/02/03   DATE OF ADMISSION:  07/31/2008  DATE OF DISCHARGE:  08/02/2008                               DISCHARGE SUMMARY   COURSE IN HOSPITAL:  This 40 year old female with known history of mood  disorder and psychosis was admitted after the patient had initially  taken cocaine overdose to kill herself with suicidal ideation.  The  patient was admitted to a medical floor with telemetry monitoring as the  patient has complained of chest pain.  Serial cardiac enzymes and EKGs  are within acceptable limits at this time.  The patient is chest pain-  free.  Psychiatric consult was obtained as per Dr. Jeanie Sewer,  psychiatrist.  The patient is to be transferred once medically stable.  The patient at this time is medically stable to transfer to Behavioral  health once a bed is available.  The patient is agreeable to the plan to  transfer to [facility].   PROCEDURES DONE DURING THIS STAY:  None.   PERTINENT LABS:  Troponins were negative.  EKG was within acceptable  limits.   FINAL DIAGNOSES:  1. 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  Acute coronary syndrome      ruled out.  2. 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   MEDICATION AT TRANSFER:  1. Xanax 0.5 mg p.o. t.i.d.  2. Depakote 5 mg p.o. daily.  3. Multivitamin 1 tablet p.o. daily.  4. Nicotine patch 21 mg per day patch to be tapered over 6 weeks.  5. Seroquel 100 mg t.i.d.  6. Trazodone 150 mg p.o. at bedtime.   The patient is to be transferred to [facility] for further  management of her psychiatric issues and medication to be addressed at  that time.      Eduard Clos, MD  Electronically Signed     ANK/MEDQ  D:  08/02/2008  T:  08/02/2008  Job:   [id number]

## 2010-07-23 NOTE — Discharge Summary (Signed)
NAME:  Courtney, [name] NO.:  [id number]   MEDICAL RECORD NO.:  [id number]          PATIENT TYPE:  INP   LOCATION:  1445                         FACILITY:  [facility]   PHYSICIAN:  Eduard Clos, MDDATE OF BIRTH:  07-16-1970   DATE OF ADMISSION:  07/31/2008  DATE OF DISCHARGE:                               DISCHARGE SUMMARY   Please refer to the discharge summary dictated by Dr. Midge Minium  on Aug 02, 2008, for the course in hospital and the procedures done per  the discharge summary.  As per the discharge summary the patient was  waiting for a room in [facility].  She started developing chest  pain again.  Cardiac enzymes were repeated at this time and it showed  normal troponins although the CPKs were 989, 1195 and 889 which showed a  declining trend with IV hydration which was started.  EKG showed normal  sinus rhythm, nonspecific ST wave changes.  Cardiology was consulted for  the patient.  No further intervention required.  At this time as the  patient's symptoms have resolved the patient will be transferred to  [facility] once bed available.  transferred once medically  stable.  The patient at this time is medically stable to transfer to  Behavioral health once bed available.   PROCEDURES DONE:  Since last discharge summary, none.   PERTINENT LABS:  The patient had elevated CPK of 989 which increased up  to 1195 and showed a declining trend at 889 with hydration.  Troponins  were all negative.   FINAL DIAGNOSES:  1. Atypical chest pain.  2. 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   MEDICATION AT TRANSFER:  1. Xanax 0.5 mg p.o. t.i.d.  2. Depakote 500 mg p.o. daily.  3. Multivitamin 1 tablet p.o. daily.  4. Nicotine patch 21 mg per day patch to be tapered.  5. Seroquel 100 mg p.o. t.i.d.  6. Trazodone 150 mg p.o. at bedtime.   The patient is to be transferred to [facility] for further  management of her  psychiatric issues and medication to be addressed at  that time.  The patient is to be on regular diet with activity as  tolerated.  The patient placed on suicide precautions.      Eduard Clos, MD  Electronically Signed     ANK/MEDQ  D:  08/06/2008  T:  08/06/2008  Job:  [id number]
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
## 2010-07-23 NOTE — Discharge Summary (Signed)
Courtney [name], Courtney [name]                ACCOUNT NO.:  [id number]   MEDICAL RECORD NO.:  [id number]          PATIENT TYPE:  INP   LOCATION:  3733                         FACILITY:  [facility]   PHYSICIAN:  Monte Fantasia, MD  DATE OF BIRTH:  [birth year]   DATE OF ADMISSION:  01/23/2008  DATE OF DISCHARGE:  01/27/2008                               DISCHARGE SUMMARY   PRIMARY CARE PHYSICIAN:  Lonia Blood, M.D.   DISCHARGE DIAGNOSES:  1. Atypical chest pain.  2. Polysubstance abuse.  3. Ongoing tobacco abuse.  4. Anxiety.  5. Strong family history of coronary artery disease.   DISCHARGE MEDICATIONS:  Albuterol 2 puffs inhalation q.6 p.r.n.,  breathlessness; aspirin 81 mg p.o. daily; Klonopin 0.5 mg p.o. t.i.d.;  Flexeril 10 mg p.o. t.i.d.; Protonix 40 mg p.o. q.12.   ALLERGIES:  No known drug allergies.   PAST MEDICAL HISTORY:  Anxiety and depression.   COURSE DURING THE HOSPITAL STAY:  A 40 year old Caucasian lady patient  who was admitted with complaints of left-sided chest pain described as  with radiation to the shoulder.  The patient was initially evaluated and  sent home with diagnosis of musculoskeletal pain, but she had continued  to have the pain on and off through the weekend and was admitted with  the chest pain.  The chest pain was pressure-like and moving to her left  shoulder and was initially admitted to rule out unstable angina.  The  patient had 3 sets of cardiac enzymes done during the hospital stay,  which was negative, then she had no telemetry events.  The patient was  evaluated by cardiologist with [facility] for which initially a 2-  D echo was done.  The echo overall showed left ventricular systolic  function normal and with EF of 60%; however, there was a density seen in  the aortic arch at the level of the left subclavian, of unknown  significance.  As per the Cardiology, recommended CT angio of the chest  and also a stress test and the patient had CT  angiogram, which was done  on January 26, 2008, showed no evidence of any aortic dissection or  acute process in the abdomen.  Mucosal enhancement of the gallbladder,  left hepatic flexure, colonic soft tissues fullness, likely due to  undistention and prominent stools.  The CT angio of the chest, no  evidence of any aortic branch vessel infection seen, minimal  calcification in the AP window likely to be a ductus remnant.  The  patient's stress Myoview, which was done showed no evidence of any  ischemia/infarction in any vascular territory and EF was 57% with a  normal wall motion.  The patient, as per Cardiology, had no sign  suggestive of any cardiac pathology or myocardial infarction or aortic  pathology and was cleared to be discharged.  The patient at present is  stable and can be discharged home.   PROCEDURES DONE DURING THE STAY IN THE HOSPITAL:  1. Myoview stress test done on January 26, 2008.  Impression:  No evidence of ischemia/infarction in any vascular  territory, gated  ejection fraction of 57%, wall motion was normal.  1. CT angio done on January 26, 2008.  Impression:  No evidence of aortic branch vessel dissection or acute  process, minimal calcification in the AP window likely to be related to  be ductus remnant.  1. CT scan of the abdomen, no evidence of aortic dissection or acute      process in the abdomen.  Mucosal enhancement in the gallbladder,      apparent hepatic flexure, colonic soft tissue fullness, likely to      be undistention and prominent stool.  2. Chest x-ray done on admission, on January 23, 2008.  No acute      cardiopulmonary abnormalities seen.   DISPOSITION:  The patient is planned for discharge today.  The patient  can be discharged on the medications above.  The patient needs to follow  up with her primary care physician next week.      Monte Fantasia, MD  Electronically Signed     MP/MEDQ  D:  01/27/2008  T:  01/28/2008  Job:   [id number]   cc:   Lonia Blood, M.D.
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
## 2010-07-26 NOTE — Discharge Summary (Signed)
NAME:  Courtney [name], Courtney [name] NO.:  [id number]   MEDICAL RECORD NO.:  [id number]          PATIENT TYPE:  IPS   LOCATION:  0506                          FACILITY:  [facility]   PHYSICIAN:  Geoffery Lyons, M.D.      DATE OF BIRTH:  [birth year]   DATE OF ADMISSION:  11/17/2007  DATE OF DISCHARGE:  11/18/2007                               DISCHARGE SUMMARY   CHIEF COMPLAINT AND PRESENT ILLNESS:  This was the first admission to  [facility] for this 40 year old female voluntarily  admitted.  History of opiate addiction.  Has been using Percocet,  oxycodone and Vicodin, taking up to 30 pills a day.  Has been using for  4 years.  Also has been snorting heroin in addition to taking the pain  pills.  Initially they were prescribed but then her pain medication  would be gone in a week after a 30-day prescription.  She was trying to  use heroin to come off her pain pills but was unsuccessful.  Longest  sobriety 6 weeks, feeling depressed, wanting to get help.   PAST PSYCHIATRIC HISTORY:  First time at Behavior Health.  No prior  treatment:   ALCOHOL AND DRUG HISTORY:  As already stated, persistent use of opiates.  Also using marijuana.   MEDICAL HISTORY:  Chronic pain.   MEDICATIONS:  1. Klonopin 1 mg 3 times a day.  2. Effexor XR 75 mg per day.  3. Ambien 12.5 CR for sleep.  4. Vicodin as already stated, abusing it.   Physical exam failed to show any acute findings.   LABORATORY WORKUP:  CBC within normal limits.  UDS positive for opiates  and marijuana.   MENTAL STATUS EXAM:  A fully alert, cooperative female for fair eye  contact.  Casually dressed.  Speech is clear, normal rate, tempo and  production.  Thought processes are logical, coherent and relevant.  No  active suicidal or homicidal ideas.  No delusions.  No hallucinations.  Wants to be detoxed from opiates so that she can go on with her life.  Cognition well-preserved.   ADMISSION DIAGNOSIS:  AXIS I:   Opiate dependence.  Marijuana abuse.  Depressive disorder, not otherwise specified.  AXIS II:  No diagnosis.  AXIS III:  Chronic pain.  AXIS IV: Moderate.  AXIS V:  On admission 35-40, highest GAF in the last year 60.   COURSE IN THE HOSPITAL:  She was admitted.  She was detoxed with  clonidine.  She was given some trazodone for sleep, started on Neurontin  and placed back on Effexor.  She endorsed she was tired of living like  that.  She has 2 kids.  She was motivated for treatment.  She has  diskogenic disk disease and scoliosis.  She was actively withdrawing  upon initial assessment.  Detox went uneventfully.  By September 10 she  endorsed she was feeling better.  She wanted to be discharged.  She  actually had a court date the following day, wanting to be discharged as  she had to be ready to be in  court.  As she was not exhibiting any  active withdrawal symptoms and the medication was controlling them, we  went ahead and discharged to outpatient follow-up.   DISCHARGE DIAGNOSES:  AXIS I:  Opiate dependence.  Status post opiate  withdrawal.  Depressive disorder. not otherwise specified.  Anxiety  disorder, not otherwise specified.  AXIS II:  No diagnosis.  AXIS III:  Chronic pain.  AXIS IV: Moderate.  AXIS V:  Upon discharge 50-55.   Discharged on:  1. Neurontin 100 mg 2 four times a day.  2. Effexor XR 75 mg per day.  3. Trazodone 100 at bedtime.   FOLLOWUP:  [facility] in [place].      Geoffery Lyons, M.D.  Electronically Signed     IL/MEDQ  D:  11/24/2007  T:  11/26/2007  Job:  [id number]
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
## 2010-07-26 NOTE — Discharge Summary (Signed)
NAME:  Courtney [name], Courtney [name] NO.:  [id number]   MEDICAL RECORD NO.:  [id number]          PATIENT TYPE:  IPS   LOCATION:  0304                          FACILITY:  [facility]   PHYSICIAN:  Jasmine Pang, M.D. DATE OF BIRTH:  Jul 23, 1970   DATE OF ADMISSION:  08/06/2008  DATE OF DISCHARGE:  08/08/2008                               DISCHARGE SUMMARY   IDENTIFICATION:  This is a 40 year old separated white female from  [place], who was admitted on a voluntary basis on Aug 04, 2008.   HISTORY OF PRESENT ILLNESS:  The patient was suicide has suicidal  ideation and polysubstance abuse.  She was admitted to the medical floor  or shortness of breath, chest pain, and suicidal ideation.  She reports  severe mood swings and depression.  She states she has been using  cocaine excessively to try to kill herself.  She reports poor sleep,  racing thoughts, pressured speech, and angry or euphoric mood.  She  stated she can hear her grandfather, who has deceased talking to her she  has stressor revolves around her 10-year marriage breaking up.  She  states she was here in March and was clean for 2-1/2 months after words.  Prior to admission she was using cocaine and marijuana.  She wants long-  term treatment.  For further admission information, see psychiatric  admission assessment. An initial diagnosis of major depressive disorder,  recurrent, severe was given to her as well as polysubstance abuse  diagnosis under Axis I.  Axis II was none.  Axis III, recent atypical  chest pain workup that was negative.   PHYSICAL FINDINGS:  There were no acute physical or medical problems  noted.   ADMISSION LABORATORIES:  BMET was within normal limits.  Hemoglobin was  11.3, hematocrit was 33.9.  TSH was normal at 1.627.   HOSPITAL COURSE:  Upon admission, the patient was restarted on her home  medications of Seroquel 100 mg p.o. t.i.d., nicotine patch 21 mg p.o.  daily as well as Depakote 500 mg  p.o. daily.  She was also started on  Haldol 5 mg p.o. q.6 hours, p.r.n.  She was started on lithium carbonate  300 mg p.o. b.i.d. and Remeron 15 mg p.o. q.h.s.  She was started on  Librium 25 mg p.o. q.6 hours, p.r.n. symptoms of withdrawal or anxiety.  In individual sessions, the patient was cooperative, but anxious.  She  was casually dressed with fair eye contact.  Motor behavior was within  normal limits.  Speech was normal rate and flow.  She was alert and  oriented x4.  Mood was depressed and anxious.  Affect consistent with  mood.  She denied suicidal ideation at this point.  She denied homicidal  ideation.  There was no evidence of thought disorder or psychosis.  The  patient was discussing her anxiety about her 10-year marriage ending.  She talked about hearing her grandfather who has deceased talking to  her.  She was not hallucinating now.  She was no longer suicidal.  On  August 08, 2008, the patient stated she  was feeling better.  She was still  somewhat anxious, but less depressed.  She wanted to go home.  She was  unhappy that she was not getting her pain medications and  benzodiazepines here.  Her affect was wide range.  There was no suicidal  or homicidal ideation.  No thoughts of self-injurious behavior.  No  auditory or visual hallucinations.  No paranoia or delusions.  Thoughts  were logical and goal-directed.  Thought content, no predominant theme.  Cognitive was grossly intact.  Insight fair.  Judgment fair.  Impulse  control fair, it was felt the patient was safe for discharge today.   DISCHARGE DIAGNOSES:  Axis I:  Mood disorder, not otherwise specified,  polysubstance abuse.  Axis II:  Features of borderline personality disorder.  Axis III:  Recent atypical chest pain, resolved.  Axis IV:  Severe (problems with primary support group, problems related  to social environment, problems due to chronic substance use, burden of  psychiatric illness).  Axis V:  Global  assessment of functioning was 50 upon discharge.  GAF  was 35 upon admission.  GAF highest past year was 60.   DISCHARGE PLANS:  There was no specific activity level or dietary  restrictions.   POSTHOSPITAL CARE PLANS:  The patient will go to the [facility] for  CD IOP.  On August 09, 2008 at 8:30 a.m.   DISCHARGE MEDICATIONS:  Xanax and Percocet were discontinued, she should  not restart these.  Adderall was discontinued too, and she should not  restart this.  She was discharged on:  1. Estrace 0.5 mg daily.  2. Trazodone 150 mg at bedtime.  3. Remeron 15 mg at bedtime.  4. Flexeril 10 mg 3 times daily as needed for muscle spasms.  5. Lithium 300 mg twice a day.  6. Lyrica 50 mg twice daily and at bedtime.  7. Seroquel 100 mg up to 3 times a day as needed for agitation.      Jasmine Pang, M.D.  Electronically Signed     BHS/MEDQ  D:  08/10/2008  T:  08/11/2008  Job:  [id number]

## 2010-11-29 LAB — URINALYSIS, ROUTINE W REFLEX MICROSCOPIC
Bilirubin Urine: NEGATIVE
Hgb urine dipstick: NEGATIVE
Nitrite: NEGATIVE
Protein, ur: NEGATIVE
Specific Gravity, Urine: 1.036 — ABNORMAL HIGH
Urobilinogen, UA: 0.2

## 2010-11-29 LAB — DIFFERENTIAL
Basophils Absolute: 0
Basophils Relative: 0
Eosinophils Relative: 0
Lymphocytes Relative: 22
Neutro Abs: 4.1

## 2010-11-29 LAB — COMPREHENSIVE METABOLIC PANEL
Alkaline Phosphatase: 45
BUN: 14
CO2: 25
Chloride: 104
Creatinine, Ser: 0.78
GFR calc non Af Amer: >60
Potassium: 3.5
Total Bilirubin: 0.5

## 2010-11-29 LAB — CBC
HCT: 39.7
Hemoglobin: 13.8
MCV: 91.4
RBC: 4.34
WBC: 6.3

## 2010-11-29 LAB — LIPASE, BLOOD: Lipase: 25

## 2010-12-06 LAB — POCT I-STAT, CHEM 8
Hemoglobin: 15.6 — ABNORMAL HIGH
Sodium: 142
TCO2: 27

## 2010-12-06 LAB — DIFFERENTIAL
Basophils Absolute: 0
Basophils Relative: 1
Eosinophils Absolute: 0
Monocytes Relative: 6
Neutro Abs: 3.5
Neutrophils Relative %: 60

## 2010-12-06 LAB — CBC
MCHC: 34.2
MCV: 91.1
Platelets: 331
RBC: 4.71

## 2010-12-06 LAB — D-DIMER, QUANTITATIVE: D-Dimer, Quant: 0.25

## 2010-12-10 LAB — CK TOTAL AND CKMB (NOT AT ARMC)
CK, MB: 0.6
CK, MB: 0.8
Relative Index: INVALID
Relative Index: INVALID
Relative Index: INVALID
Total CK: 52
Total CK: 57
Total CK: 61

## 2010-12-10 LAB — URINALYSIS, ROUTINE W REFLEX MICROSCOPIC
Bilirubin Urine: NEGATIVE
Glucose, UA: NEGATIVE
Glucose, UA: NEGATIVE
Hgb urine dipstick: NEGATIVE
Hgb urine dipstick: NEGATIVE
Ketones, ur: NEGATIVE
Protein, ur: NEGATIVE
Protein, ur: NEGATIVE
Specific Gravity, Urine: 1.014
pH: 7

## 2010-12-10 LAB — POCT I-STAT, CHEM 8
BUN: 10
BUN: 11
Calcium, Ion: 1.19
Chloride: 100
Chloride: 104
Creatinine, Ser: 0.7
Creatinine, Ser: 1
Glucose, Bld: 104 — ABNORMAL HIGH
Potassium: 3.6
Potassium: 3.8
Sodium: 142

## 2010-12-10 LAB — BASIC METABOLIC PANEL
BUN: 3 — ABNORMAL LOW
BUN: 4 — ABNORMAL LOW
CO2: 28
Calcium: 8.5
Calcium: 8.8
Chloride: 105
Creatinine, Ser: 0.7
Creatinine, Ser: 0.64
GFR calc Af Amer: >60
GFR calc Af Amer: >60
GFR calc non Af Amer: >60
Glucose, Bld: 83
Potassium: 3.8
Sodium: 138

## 2010-12-10 LAB — RAPID URINE DRUG SCREEN, HOSP PERFORMED
Barbiturates: POSITIVE — AB
Benzodiazepines: POSITIVE — AB
Cocaine: NOT DETECTED

## 2010-12-10 LAB — POCT CARDIAC MARKERS
CKMB, poc: <1 — ABNORMAL LOW
Myoglobin, poc: 25
Troponin i, poc: <0.05

## 2010-12-10 LAB — DIFFERENTIAL
Basophils Relative: 0
Lymphs Abs: 1.6
Monocytes Relative: 7
Neutro Abs: 6.7
Neutrophils Relative %: 75

## 2010-12-10 LAB — CBC
MCHC: 34.2
MCHC: 33.1
MCHC: 34.2
MCV: 94.2
MCV: 95.3
Platelets: 213
Platelets: 214
Platelets: 330
RBC: 3.73 — ABNORMAL LOW
RBC: 4.7
RDW: 12.9
WBC: 8.9
WBC: 7.5

## 2010-12-10 LAB — LIPID PANEL
HDL: 35 — ABNORMAL LOW
Total CHOL/HDL Ratio: 5
VLDL: 10

## 2010-12-10 LAB — PREGNANCY, URINE: Preg Test, Ur: NEGATIVE

## 2010-12-10 LAB — POCT PREGNANCY, URINE: Preg Test, Ur: NEGATIVE

## 2010-12-10 LAB — ETHANOL: Alcohol, Ethyl (B): <5

## 2010-12-10 LAB — TYPE AND SCREEN
ABO/RH(D): O POS
Antibody Screen: NEGATIVE

## 2010-12-11 LAB — RAPID URINE DRUG SCREEN, HOSP PERFORMED
Amphetamines: NOT DETECTED
Amphetamines: NOT DETECTED
Barbiturates: NOT DETECTED
Barbiturates: POSITIVE — AB
Benzodiazepines: NOT DETECTED
Benzodiazepines: NOT DETECTED
Cocaine: NOT DETECTED
Cocaine: NOT DETECTED
Opiates: POSITIVE — AB
Opiates: POSITIVE — AB
Tetrahydrocannabinol: NOT DETECTED
Tetrahydrocannabinol: POSITIVE — AB

## 2010-12-11 LAB — BASIC METABOLIC PANEL
CO2: 26
Calcium: 9.8
Creatinine, Ser: 0.68
GFR calc Af Amer: >60
GFR calc non Af Amer: >60

## 2010-12-11 LAB — DIFFERENTIAL
Basophils Absolute: 0
Basophils Absolute: 0
Basophils Relative: 0
Eosinophils Absolute: 0
Eosinophils Absolute: 0
Eosinophils Relative: 0
Eosinophils Relative: 0
Lymphocytes Relative: 21
Lymphocytes Relative: 29
Lymphs Abs: 1.7
Monocytes Absolute: 0.3
Monocytes Absolute: 0.3
Monocytes Relative: 4
Neutro Abs: 6.2
Neutrophils Relative %: 76

## 2010-12-11 LAB — URINALYSIS, ROUTINE W REFLEX MICROSCOPIC
Bilirubin Urine: NEGATIVE
Glucose, UA: NEGATIVE
Glucose, UA: NEGATIVE
Hgb urine dipstick: NEGATIVE
Ketones, ur: NEGATIVE
Nitrite: NEGATIVE
Protein, ur: NEGATIVE
Specific Gravity, Urine: 1.008
Specific Gravity, Urine: 1.022
Urobilinogen, UA: 0.2
pH: 8
pH: 8

## 2010-12-11 LAB — POCT I-STAT, CHEM 8
BUN: 6
Calcium, Ion: 1.14
Chloride: 105
Creatinine, Ser: 0.6
Glucose, Bld: 103 — ABNORMAL HIGH
HCT: 40
Hemoglobin: 13.6
Potassium: 3.6
Sodium: 142
TCO2: 28

## 2010-12-11 LAB — CBC
HCT: 39.5
Hemoglobin: 13.7
MCHC: 34.1
MCHC: 34.6
MCV: 91.3
Platelets: 253
RBC: 4.33
RBC: 4.51
RDW: 12.3
WBC: 6.9
WBC: 8.2

## 2010-12-11 LAB — PREGNANCY, URINE: Preg Test, Ur: NEGATIVE

## 2010-12-11 LAB — URINE MICROSCOPIC-ADD ON

## 2010-12-11 LAB — POCT PREGNANCY, URINE: Preg Test, Ur: NEGATIVE

## 2010-12-11 LAB — ETHANOL
Alcohol, Ethyl (B): <5
Alcohol, Ethyl (B): <5

## 2011-03-10 ENCOUNTER — Emergency Department (HOSPITAL_COMMUNITY)
Admission: EM | Admit: 2011-03-10 | Discharge: 2011-03-11 | Disposition: A | Discharge status: Home / Self Care | Payer: Self-pay | Attending: Emergency Medicine | Admitting: Emergency Medicine

## 2011-03-10 ENCOUNTER — Emergency Department (HOSPITAL_COMMUNITY): Payer: Self-pay

## 2011-03-10 DIAGNOSIS — M546 Pain in thoracic spine: Secondary | ICD-10-CM | POA: Insufficient documentation

## 2011-03-10 DIAGNOSIS — R112 Nausea with vomiting, unspecified: Secondary | ICD-10-CM | POA: Insufficient documentation

## 2011-03-10 DIAGNOSIS — R109 Unspecified abdominal pain: Secondary | ICD-10-CM | POA: Insufficient documentation

## 2011-03-10 HISTORY — DX: Unspecified osteoarthritis, unspecified site: M19.90

## 2011-03-10 HISTORY — DX: Scoliosis, unspecified: M41.9

## 2011-03-10 LAB — URINALYSIS, ROUTINE W REFLEX MICROSCOPIC
Glucose, UA: NEGATIVE mg/dL
Leukocytes, UA: NEGATIVE
Protein, ur: NEGATIVE mg/dL
Urobilinogen, UA: 0.2 mg/dL (ref 0.0–1.0)

## 2011-03-10 LAB — POCT I-STAT, CHEM 8
Calcium, Ion: 1.06 mmol/L — ABNORMAL LOW (ref 1.12–1.32)
Chloride: 113 mEq/L — ABNORMAL HIGH (ref 96–112)
HCT: 43 % (ref 36.0–46.0)
TCO2: 21 mmol/L (ref 0–100)

## 2011-03-10 LAB — PREGNANCY, URINE: Preg Test, Ur: NEGATIVE

## 2011-03-10 LAB — URINE MICROSCOPIC-ADD ON

## 2011-03-10 MED ORDER — OXYCODONE-ACETAMINOPHEN 5-325 MG PO TABS
1.0000 | ORAL_TABLET | Freq: Once | ORAL | Status: AC
  Administered 2011-03-11: 1 via ORAL
  Filled 2011-03-10: qty 1

## 2011-03-10 MED ORDER — ONDANSETRON HCL 4 MG/2ML IJ SOLN: 4.0000 mg | Freq: Once | INTRAMUSCULAR | Status: DC

## 2011-03-10 MED ORDER — HYDROMORPHONE HCL PF 1 MG/ML IJ SOLN
1.0000 mg | Freq: Once | INTRAMUSCULAR | Status: AC
  Administered 2011-03-10: 1 mg via INTRAVENOUS
  Filled 2011-03-10: qty 1

## 2011-03-10 MED ORDER — IBUPROFEN 800 MG PO TABS: 800.0000 mg | ORAL_TABLET | Freq: Three times a day (TID) | ORAL | Status: DC

## 2011-03-10 MED ORDER — METOCLOPRAMIDE HCL 5 MG/ML IJ SOLN
10.0000 mg | Freq: Once | INTRAMUSCULAR | Status: AC
  Administered 2011-03-10: 10 mg via INTRAVENOUS
  Filled 2011-03-10: qty 2

## 2011-03-10 MED ORDER — HYDROCODONE-ACETAMINOPHEN 5-325 MG PO TABS: 1.0000 | ORAL_TABLET | ORAL | Status: DC | PRN

## 2011-03-10 MED ORDER — PROMETHAZINE HCL 25 MG PO TABS
25.0000 mg | ORAL_TABLET | ORAL | Status: AC
  Administered 2011-03-11: 25 mg via ORAL
  Filled 2011-03-10: qty 1

## 2011-03-10 NOTE — ED Notes (Signed)
To CT

## 2011-03-10 NOTE — ED Provider Notes (Signed)
History     CSN: [id number]  Arrival date & time 03/10/11  1625   First MD Initiated Contact with Patient 03/10/11 1904      Chief Complaint  Patient presents with  . Back Pain  . Emesis    (Consider location/radiation/quality/duration/timing/severity/associated sxs/prior treatment) HPI History provided by pt.   Pt reports that she was admitted to [facility] last month for IV abx to treat abscess of right kidney.  This is the second kidney abscess she has had, the first one treated at [facility].  Never followed up with a doctor for financial reasons but symptoms improved.  Presents today w/ severe, non-traumatic R flank pain since yesterday.  Associated w/ foul-smelling urine and N/V.  Has not urinated since 7am today but denies abd pain.  No known fever.  No cough or SOB. Sx feel similar to those she experienced w/ abscess.  Denies trauma.  Per records faxed from [facility], pt was diagnosed w/ UTI in setting of R flank pain, reported fever and N/V.  CT abd/pelvis showed unremarkable kidneys.  Likely diagnosed w/ and treated for pyelonephritis; not abscess.   Per prior chart, pt has scoliosis at approx T12-L2 as well as degenerative changes.    Past Medical History  Diagnosis Date  . Scoliosis   . DJD (degenerative joint disease)     Past Surgical History  Procedure Date  . Abdominal hysterectomy     No family history on file.  History  Substance Use Topics  . Smoking status: Current Everyday Smoker -- 0.5 packs/day  . Smokeless tobacco: Not on file  . Alcohol Use: No    OB History    Grav Para Term Preterm Abortions TAB SAB Ect Mult Living                  Review of Systems  All other systems reviewed and are negative.    Allergies  Morphine and related; Penicillins; and Zofran  Home Medications   Current Outpatient Rx  Name Route Sig Dispense Refill  . ALPRAZOLAM 0.5 MG PO TABS Oral Take 0.5 mg by mouth 3 (three) times  daily as needed. For anxiety     . ESTROGENS CONJUGATED 0.45 MG PO TABS Oral Take 0.45 mg by mouth daily.        BP 116/83  Pulse 79  Temp(Src) 98.3 F (36.8 C) (Oral)  Resp 18  SpO2 98%  Physical Exam  Nursing note and vitals reviewed. Constitutional: She is oriented to person, place, and time. She appears well-developed and well-nourished. No distress.       Uncomfortable appearing, particularly w/ movement  HENT:  Head: Normocephalic and atraumatic.  Eyes:       Normal appearance  Neck: Normal range of motion.  Cardiovascular: Normal rate and regular rhythm.   Pulmonary/Chest: Effort normal and breath sounds normal.  Abdominal: Soft. Bowel sounds are normal. She exhibits no distension and no mass. There is no rebound and no guarding.        RLQ and R CVA tenderness  Musculoskeletal:       Mild tenderness approx T8-T12, both mid-line and paraspinals  Neurological: She is alert and oriented to person, place, and time.  Skin: Skin is warm and dry. No rash noted.  Psychiatric: She has a normal mood and affect. Her behavior is normal.    ED Course  Procedures (including critical care time)  Labs Reviewed  URINALYSIS, ROUTINE W REFLEX MICROSCOPIC -  Abnormal; Notable for the following:    Hgb urine dipstick MODERATE (*)    All other components within normal limits  POCT I-STAT, CHEM 8 - Abnormal; Notable for the following:    Chloride 113 (*)    BUN 24 (*)    Calcium, Ion 1.06 (*)    All other components within normal limits  URINE MICROSCOPIC-ADD ON - Abnormal; Notable for the following:    Squamous Epithelial / LPF FEW (*)    All other components within normal limits  PREGNANCY, URINE  I-STAT, CHEM 8   Ct Abdomen Pelvis Wo Contrast  03/10/2011  *RADIOLOGY REPORT*  Clinical Data: Pain.  Difficulty urinating.  CT ABDOMEN AND PELVIS WITHOUT CONTRAST  Technique:  Multidetector CT imaging of the abdomen and pelvis was performed following the standard protocol without  intravenous contrast.  Comparison: None.  Findings: The liver, spleen, pancreas, adrenal glands, and kidneys are normal.  No ureteral or renal calculi.  No hydronephrosis. Terminal ileum and appendix are normal.  Uterus and ovaries appear to have been removed.  No diverticular disease.  No free air or free fluid.  No significant osseous abnormality.  IMPRESSION: Benign-appearing abdomen and pelvis.  Original Report Authenticated By: Gwynn Burly, M.D.     1. Flank pain       MDM  Pt presents w/ non-traumatic right flank pain w/ associated nausea and foul-smelling urine.  No pulmonary sx.  Diagnosed w/ and treated for pyelo at [facility] last month and this pain feels similar.  Also has h/o kidney stones.  Afebrile, uncomfortable appearing, mild thoracic spine and both R CVA and RLQ ttp on exam.  U/A shows hbg but no infection.  CT abd/pelvis ordered to r/o nephrolithiasis and kidneys appear normal.  Pt most likely has a muscle strain and now admits to recently heavy lifting.  Pain could also be a radiculopathy (h/o thoracic scoliosis and DJD based on MRI 2007) or recently passed stone.  Pt d/c'd home w/ ibuprofen and vicodin.  Rest and ice recommended.  Return precautions discussed.        Courtney [name], [place] 03/10/11 2350

## 2011-03-10 NOTE — ED Notes (Signed)
Pt reporting right sided back pain since yesterday. Pt reports nausea, vomited since last night. Denying any CP, SOB. Reporting urine has "ammonia" odor. Reports hospitalized 2 mo ago for abscess to right kidney. Pain 10/10. Pt currently in fetal position. No obvious deformity or injury noted. Pt denies any hematuria, denying any frequency or dysuria.

## 2011-03-10 NOTE — ED Notes (Signed)
Records arrived.

## 2011-03-10 NOTE — ED Notes (Signed)
EDPA at [facility], pain decreased, denies nausea, looks comfortable.

## 2011-03-10 NOTE — ED Notes (Signed)
Nausea improved, pain 6/10, pain med given, pending CT. Alert, NAD calm interactive, no changes. EDPA in to see pt.
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
## 2011-03-10 NOTE — ED Notes (Signed)
Faxed release of Records to [facility]  System per PA.

## 2011-03-10 NOTE — ED Notes (Signed)
Followed up with Babette Relic, RN supervisor @ [facility] regarding records to be faxed. In progress.

## 2011-03-10 NOTE — ED Notes (Signed)
Provided patient with food and beverage

## 2011-03-11 ENCOUNTER — Emergency Department (HOSPITAL_COMMUNITY)
Admission: EM | Admit: 2011-03-11 | Discharge: 2011-03-11 | Disposition: A | Discharge status: Home / Self Care | Payer: Self-pay | Attending: Emergency Medicine | Admitting: Emergency Medicine

## 2011-03-11 ENCOUNTER — Encounter (HOSPITAL_COMMUNITY): Payer: Self-pay | Admitting: *Deleted

## 2011-03-11 DIAGNOSIS — M549 Dorsalgia, unspecified: Secondary | ICD-10-CM

## 2011-03-11 DIAGNOSIS — M545 Low back pain, unspecified: Secondary | ICD-10-CM | POA: Insufficient documentation

## 2011-03-11 DIAGNOSIS — M4120 Other idiopathic scoliosis, site unspecified: Secondary | ICD-10-CM | POA: Insufficient documentation

## 2011-03-11 DIAGNOSIS — F411 Generalized anxiety disorder: Secondary | ICD-10-CM | POA: Insufficient documentation

## 2011-03-11 DIAGNOSIS — R109 Unspecified abdominal pain: Secondary | ICD-10-CM | POA: Insufficient documentation

## 2011-03-11 DIAGNOSIS — Z79899 Other long term (current) drug therapy: Secondary | ICD-10-CM | POA: Insufficient documentation

## 2011-03-11 DIAGNOSIS — F17200 Nicotine dependence, unspecified, uncomplicated: Secondary | ICD-10-CM | POA: Insufficient documentation

## 2011-03-11 DIAGNOSIS — R51 Headache: Secondary | ICD-10-CM | POA: Insufficient documentation

## 2011-03-11 DIAGNOSIS — M1990 Unspecified osteoarthritis, unspecified site: Secondary | ICD-10-CM | POA: Insufficient documentation

## 2011-03-11 MED ORDER — HYDROMORPHONE HCL PF 1 MG/ML IJ SOLN
2.0000 mg | Freq: Once | INTRAMUSCULAR | Status: AC
  Administered 2011-03-11: 2 mg via INTRAMUSCULAR
  Filled 2011-03-11: qty 2

## 2011-03-11 MED ORDER — ONDANSETRON 4 MG PO TBDP
8.0000 mg | ORAL_TABLET | Freq: Once | ORAL | Status: DC
  Filled 2011-03-11 (×2): qty 1

## 2011-03-11 NOTE — ED Provider Notes (Signed)
Medical screening examination/treatment/procedure(s) were performed by non-physician practitioner and as supervising physician I was immediately available for consultation/collaboration.  Doug Sou, MD 03/11/11 7829

## 2011-03-11 NOTE — ED Notes (Signed)
Pt seen here last nite for right flank pain and told to come back if increased or nausea not relieved.  Pt has history of kidney stones

## 2011-03-11 NOTE — ED Provider Notes (Signed)
History     CSN: [id number]  Arrival date & time 03/11/11  1191   First MD Initiated Contact with Patient 03/11/11 1021      Chief Complaint  Patient presents with  . Flank Pain    right    (Consider location/radiation/quality/duration/timing/severity/associated sxs/prior treatment) Patient is a 41 y.o. female presenting with flank pain. The history is provided by the patient.  Flank Pain This is a recurrent problem. The current episode started more than 2 days ago. The problem occurs constantly. The problem has been gradually worsening. Associated symptoms include headaches. Pertinent negatives include no chest pain and no shortness of breath. The symptoms are aggravated by bending and twisting. The symptoms are relieved by nothing. Treatments tried: Treated with hydrocodone yesterday, states no relief.   she denies chronic back pain, but relates degenerative disease of the spine.  Past Medical History  Diagnosis Date  . Scoliosis   . DJD (degenerative joint disease)     Past Surgical History  Procedure Date  . Abdominal hysterectomy     No family history on file.  History  Substance Use Topics  . Smoking status: Current Everyday Smoker -- 0.5 packs/day  . Smokeless tobacco: Not on file  . Alcohol Use: No    OB History    Grav Para Term Preterm Abortions TAB SAB Ect Mult Living                  Review of Systems  Respiratory: Negative for shortness of breath.   Cardiovascular: Negative for chest pain.  Genitourinary: Positive for flank pain.  Neurological: Positive for headaches.  All other systems reviewed and are negative.    Allergies  Morphine and related; Penicillins; and Zofran  Home Medications   Current Outpatient Rx  Name Route Sig Dispense Refill  . ALPRAZOLAM 0.5 MG PO TABS Oral Take 0.5 mg by mouth 3 (three) times daily as needed. For anxiety     . ESTROGENS CONJUGATED 0.45 MG PO TABS Oral Take 0.45 mg by mouth daily.      Marland Kitchen  HYDROCODONE-ACETAMINOPHEN 5-325 MG PO TABS Oral Take 1 tablet by mouth every 4 (four) hours as needed. For pain.       BP 96/57  Pulse 64  Temp(Src) 98.6 F (37 C) (Oral)  Resp 16  SpO2 100%  Physical Exam  Nursing note and vitals reviewed. Constitutional: She is oriented to person, place, and time. She appears well-developed and well-nourished.  HENT:  Head: Normocephalic and atraumatic.  Eyes: Conjunctivae and EOM are normal. Pupils are equal, round, and reactive to light.  Neck: Normal range of motion and phonation normal. Neck supple.  Cardiovascular: Normal rate, regular rhythm and intact distal pulses.   Pulmonary/Chest: Effort normal and breath sounds normal. She exhibits no tenderness.  Abdominal: Soft. She exhibits no distension. There is no tenderness. There is no guarding.  Musculoskeletal: Normal range of motion.       Moderate right paravertebral lumbar tenderness, pain, exacerbated by movement at this site.  Neurological: She is alert and oriented to person, place, and time. She has normal strength. No cranial nerve deficit. She exhibits normal muscle tone. Coordination normal.  Skin: Skin is warm and dry.  Psychiatric: Her behavior is normal. Judgment and thought content normal.       She is anxious    ED Course  Procedures (including critical care time)  Review of records from yesterday and previously in Epic- CT was negative yesterday, and no urinary  tract problem was uncovered. She's had multiple visits for painful conditions to our EDs. . She's previously been admitted for suicidal ideation. I reviewed the CT scan from yesterday with bone window filter and do not see any gross lumbar spine abnormalities.   Review of data from the [place] substance abuse databank; she has received narcotics from 6 different providers in the last 6 months with wide ranging geographic locations.   Emergency department treatment: She is given a single IM dose of Dilaudid.  She was informed that additional medicines could not be given. She understands the reasoning.      Labs Reviewed - No data to display Ct Abdomen Pelvis Wo Contrast  03/10/2011  *RADIOLOGY REPORT*  Clinical Data: Pain.  Difficulty urinating.  CT ABDOMEN AND PELVIS WITHOUT CONTRAST  Technique:  Multidetector CT imaging of the abdomen and pelvis was performed following the standard protocol without intravenous contrast.  Comparison: None.  Findings: The liver, spleen, pancreas, adrenal glands, and kidneys are normal.  No ureteral or renal calculi.  No hydronephrosis. Terminal ileum and appendix are normal.  Uterus and ovaries appear to have been removed.  No diverticular disease.  No free air or free fluid.  No significant osseous abnormality.  IMPRESSION: Benign-appearing abdomen and pelvis.  Original Report Authenticated By: Gwynn Burly, M.D.     1. Back pain       MDM  Nonspecific back pain with overlay of narcotic seeking drug behavior.         Flint Melter, MD 03/11/11 1131

## 2011-03-11 NOTE — Discharge Instructions (Signed)
Use the resource guide to find a Dr. to see for ongoing care of your discomfort.  RESOURCE GUIDE  Dental Problems  Patients with Medicaid: [facility] [phone] [address]                                           [phone] [address] Phone:  [phone]                                                  Phone:  [phone]  If unable to pay or uninsured, contact:  [facility] or [facility]. to become qualified for the adult dental clinic.  Chronic Pain Problems Contact [facility]  [phone] Patients need to be referred by their primary care doctor.  Insufficient Money for Medicine Contact United Way:  call "211" or [facility] [phone].  No Primary Care Doctor Call [facility]  [phone] Other agencies that provide inexpensive medical care    [facility]  [phone]    [facility] Internal Medicine  [phone]    [facility]  [phone]    [facility]  [phone]    [facility]  [phone]    [facility] Child Clinic  [phone]  Psychological Services [facility] Behavioral Health  [phone] [facility] Services  [phone] [facility] Mental Health   [phone] (emergency services [phone])  Substance Abuse Resources [facility] Drug Services  [phone] [facility] [phone] [facility] [phone] Floydene Flock [phone] Residential & Outpatient Substance Abuse Program  365-018-3562  Abuse/Neglect [facility] Child Abuse Hotline 813-214-7733 [facility] Child Abuse Hotline 931-163-4873 (After Hours)  Emergency Shelter [facility] 581-042-6284  Maternity Homes [facility] at [facility] [place] [facility] [place] [phone] [facility] [phone]  MRSA Hotline #:   437-802-7725    [facility] Resources  [facility]     United Way                          [facility] Dept. [address] [place]                       [address]      [address]  [place]                                                Cristobal Goldmann Phone:  [phone]                                   Phone:  [phone]  Phone:  [phone]  [facility] Phone:  [phone]  [facility] Child Abuse Hotline 346-694-9602 419-697-9173 (After Hours) Back Exercises Back exercises help treat and prevent back injuries. The goal of back exercises is to increase the strength of your abdominal and back muscles and the flexibility of your back. These exercises should be started when you no longer have back pain. Back exercises include:  Pelvic Tilt. Lie on your back with your knees bent. Tilt your pelvis until the lower part of your back is against the floor. Hold this position 5 to 10 sec and repeat 5 to 10 times.   Knee to Chest. Pull first 1 knee up against your chest and hold for 20 to 30 seconds, repeat this with the other knee, and then both knees. This may be done with the other leg straight or bent, whichever feels better.   Sit-Ups or Curl-Ups. Bend your knees 90 degrees. Start with tilting your pelvis, and do a partial, slow sit-up, lifting your trunk only 30 to 45 degrees off the floor. Take at least 2 to 3 seconds for each sit-up. Do not do sit-ups with your knees out straight. If partial sit-ups are difficult, simply do the above but with only tightening your abdominal muscles and holding it as directed.   Hip-Lift. Lie on your back with your knees flexed 90 degrees. Push down with your feet and shoulders as you raise your hips a couple inches off the floor; hold for 10 seconds, repeat 5 to 10 times.   Back arches. Lie on your stomach, propping yourself up on bent elbows. Slowly press on your hands, causing an arch in your low back. Repeat 3 to 5 times. Any initial stiffness and discomfort  should lessen with repetition over time.   Shoulder-Lifts. Lie face down with arms beside your body. Keep hips and torso pressed to floor as you slowly lift your head and shoulders off the floor.  Do not overdo your exercises, especially in the beginning. Exercises may cause you some mild back discomfort which lasts for a few minutes; however, if the pain is more severe, or lasts for more than 15 minutes, do not continue exercises until you see your caregiver. Improvement with exercise therapy for back problems is slow.  See your caregivers for assistance with developing a proper back exercise program. Document Released: 04/03/2004 Document Revised: 10/23/2010 Document Reviewed: 02/24/2005 [facility] Patient Information 2012 [place], [place].

## 2011-03-11 NOTE — ED Notes (Signed)
Unable to find pt in waiting room. 

## 2011-05-05 ENCOUNTER — Encounter (HOSPITAL_COMMUNITY): Payer: Self-pay

## 2011-05-05 ENCOUNTER — Emergency Department (HOSPITAL_COMMUNITY)
Admission: EM | Admit: 2011-05-05 | Discharge: 2011-05-05 | Disposition: A | Discharge status: Home / Self Care | Payer: Self-pay | Attending: Emergency Medicine | Admitting: Emergency Medicine

## 2011-05-05 DIAGNOSIS — R109 Unspecified abdominal pain: Secondary | ICD-10-CM | POA: Insufficient documentation

## 2011-05-05 DIAGNOSIS — R112 Nausea with vomiting, unspecified: Secondary | ICD-10-CM | POA: Insufficient documentation

## 2011-05-05 DIAGNOSIS — G894 Chronic pain syndrome: Secondary | ICD-10-CM | POA: Insufficient documentation

## 2011-05-05 DIAGNOSIS — F17200 Nicotine dependence, unspecified, uncomplicated: Secondary | ICD-10-CM | POA: Insufficient documentation

## 2011-05-05 DIAGNOSIS — Z87442 Personal history of urinary calculi: Secondary | ICD-10-CM | POA: Insufficient documentation

## 2011-05-05 DIAGNOSIS — M1990 Unspecified osteoarthritis, unspecified site: Secondary | ICD-10-CM | POA: Insufficient documentation

## 2011-05-05 HISTORY — DX: Calculus of kidney: N20.0

## 2011-05-05 MED ORDER — KETOROLAC TROMETHAMINE 30 MG/ML IJ SOLN
30.0000 mg | Freq: Once | INTRAMUSCULAR | Status: AC
  Administered 2011-05-05: 30 mg via INTRAVENOUS
  Filled 2011-05-05: qty 1

## 2011-05-05 NOTE — ED Notes (Signed)
Pt c/o rt flank pain radiating down to groin area with n/v, states hasn't urinated in past 24hrs, hx of abscess behind rt kidney

## 2011-05-05 NOTE — ED Provider Notes (Signed)
History     CSN: [id number]  Arrival date & time 05/05/11  0900   First MD Initiated Contact with Patient 05/05/11 [phone]      Chief Complaint  Patient presents with  . Flank Pain    (Consider location/radiation/quality/duration/timing/severity/associated sxs/prior treatment) HPI  Patient is a 41 year old woman with history of several ED visits for flank pain presents with constant right flank pain 10/10 in severity which radiates along inguinal fold. She also reports nausea and vomiting with 4-5 episodes of vomiting since midnight. She also reports she last urinated yesterday morning. She reports feeling like she has retained urine. She denies any dysuria or abnormal appearing urine. She had a normal bowel movement yesterday morning denies any diarrhea, constipation, or blood in stools. On chart review she has had multiple admissions for opiate use and detoxification. She reports that she last used vicodin for back pain 3 weeks ago. She reports no use of opiates since that time.   Past Medical History  Diagnosis Date  . Scoliosis   . DJD (degenerative joint disease)   . Kidney stones     Past Surgical History  Procedure Date  . Abdominal hysterectomy     No family history on file.  History  Substance Use Topics  . Smoking status: Current Everyday Smoker -- 0.5 packs/day  . Smokeless tobacco: Not on file  . Alcohol Use: No    OB History    Grav Para Term Preterm Abortions TAB SAB Ect Mult Living                 Review of Systems  Allergies  Morphine and related; Penicillins; and Zofran  Home Medications   Current Outpatient Rx  Name Route Sig Dispense Refill  . ESTROGENS CONJUGATED 0.45 MG PO TABS Oral Take 0.45 mg by mouth daily.        BP 109/95  Pulse 84  Temp(Src) 98 F (36.7 C) (Oral)  Resp 22  SpO2 100%  Physical Exam General: resting in bed on left side, in no acute distress, she begins to writhe when she sees I have entered room HEENT: PERRL,  EOMI, no scleral icterus Cardiac: RRR, no rubs, murmurs or gallops Pulm: clear to auscultation bilaterally, moving normal volumes of air Abd: soft, tender to palpation in RUQ, no guarding, nondistended, BS present Back: tender to palpation of right flank, no point spinal or paraspinal tenderness Ext: warm and well perfused, no pedal edema Neuro: alert and oriented X3, cranial nerves II-XII grossly intact   ED Course  Procedures (including critical care time)  Labs Reviewed - No data to display No results found.   No diagnosis found.    MDM  Advised patient that we are not concerned for kidney stone or infection of her kidneys given her recent imaging tests and prior work up. Gave toradol shot and discharged to home without opiates. Discussed that withdrawal from opiates can cause pain and nausea and vomiting and she agreed with this. She was agreeable to being discharged home at that time and said she had to pick up her son from school in 45 minutes. Advised her to use naprosyn or motrin for pain.        Margorie John, MD 05/05/11 1005  Margorie John, MD 05/05/11 [phone]

## 2011-05-05 NOTE — Discharge Instructions (Signed)
When your medicaid application come back please establish care with a primary doctor. This will be the best way for you to get care without having to come to the emergency room. This is a list of local resources including primary care clinics.  RESOURCE GUIDE  Dental Problems  Patients with Medicaid: [facility] [id number]                                           [phone] [address] Phone:  [phone]                                                  Phone:  [phone]  If unable to pay or uninsured, contact:  [facility] or [facility]. to become qualified for the adult dental clinic.  Chronic Pain Problems Contact [facility]  [phone] Patients need to be referred by their primary care doctor.  Insufficient Money for Medicine Contact United Way:  call "211" or [facility] [phone].  No Primary Care Doctor Call [facility]  [phone] Other agencies that provide inexpensive medical care    [facility]  [phone]    [facility] Internal Medicine  [phone]    [facility]  [phone]    [facility]  [phone]    [facility]  [phone]    [facility] Child Clinic  [phone]  Psychological Services [facility] Behavioral Health  [phone] [facility] Services  [phone] [facility] Mental Health   [phone] (emergency services 2292794007)  Substance Abuse Resources Alcohol and Drug Services  [phone] [facility] [phone] [facility] [place] [phone] Floydene Flock [phone] Residential & Outpatient Substance Abuse Program  3361277552  Abuse/Neglect [facility] Child Abuse Hotline 9728173620 [facility] Child Abuse Hotline 314-034-5338 (After Hours)  Emergency Shelter [facility] [phone]  Maternity Homes [facility] [place] [facility] [place] [phone] [facility] [phone]  MRSA Hotline #:   (915)284-3868    [facility] Resources  [facility] [place]     United Way                          [facility] Dept. [address] [place]                       [address]      [address]  [place]                                                Cristobal Goldmann Phone:  [phone]  Phone:  [phone]                 Phone:  [phone]  [place] [facility] Phone:  [phone]  [place] Child Abuse Hotline (336) 342-1394 ([phone]) [phone] (After Hours)   

## 2011-05-06 NOTE — ED Provider Notes (Signed)
Medical screening examination/treatment/procedure(s) were conducted as a shared visit with non-physician practitioner(s) and myself.  I personally evaluated the patient during the encounter Pt with hx chr flank pain, also ?hx opiate abuse. Mult cts neg for ureteral stone. abd soft nt. Spine nt.   Suzi Roots, MD 05/06/11 1426

## 2011-05-19 ENCOUNTER — Emergency Department (HOSPITAL_COMMUNITY)
Admission: EM | Admit: 2011-05-19 | Discharge: 2011-05-19 | Disposition: A | Discharge status: Home / Self Care | Payer: Self-pay | Attending: Emergency Medicine | Admitting: Emergency Medicine

## 2011-05-19 ENCOUNTER — Encounter (HOSPITAL_COMMUNITY): Payer: Self-pay

## 2011-05-19 DIAGNOSIS — H5711 Ocular pain, right eye: Secondary | ICD-10-CM

## 2011-05-19 DIAGNOSIS — H5710 Ocular pain, unspecified eye: Secondary | ICD-10-CM | POA: Insufficient documentation

## 2011-05-19 MED ORDER — POLYMYXIN B-TRIMETHOPRIM 10000-0.1 UNIT/ML-% OP SOLN: 1.0000 [drp] | OPHTHALMIC | Status: AC

## 2011-05-19 MED ORDER — FLUORESCEIN SODIUM 1 MG OP STRP
ORAL_STRIP | OPHTHALMIC | Status: AC
  Filled 2011-05-19: qty 1

## 2011-05-19 MED ORDER — ETODOLAC 500 MG PO TABS: 500.0000 mg | ORAL_TABLET | Freq: Two times a day (BID) | ORAL | Status: DC

## 2011-05-19 MED ORDER — TETRACAINE HCL 0.5 % OP SOLN
OPHTHALMIC | Status: AC
  Filled 2011-05-19: qty 2

## 2011-05-19 NOTE — ED Provider Notes (Signed)
History     CSN: [id number]  Arrival date & time 05/19/11  1300   First MD Initiated Contact with Patient 05/19/11 1524      Chief Complaint  Patient presents with  . Eye Problem    HPI The patient presents to the emergency room complaining of right eye pain. She states the symptoms started this weekend. She denies any foreign body or injuries. She noticed 2 small clear blisters develop on the right lateral aspect of the sclera of her right eye. these are causing eructation and pain. She denies any trouble with her vision. She denies any discharge. 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 Patient denies fevers, chills or any other complaints. Past Medical History  Diagnosis Date  . Scoliosis   . DJD (degenerative joint disease)   . Kidney stones     Past Surgical History  Procedure Date  . Abdominal hysterectomy     No family history on file.  History  Substance Use Topics  . Smoking status: Current Everyday Smoker -- 0.5 packs/day  . Smokeless tobacco: Not on file  . Alcohol Use: No    OB History    Grav Para Term Preterm Abortions TAB SAB Ect Mult Living                  Review of Systems  All other systems reviewed and are negative.    Allergies  Morphine and related; Penicillins; and Zofran  Home Medications   Current Outpatient Rx  Name Route Sig Dispense Refill  . ESTROGENS CONJUGATED 0.45 MG PO TABS Oral Take 0.45 mg by mouth daily.        BP 96/67  Pulse 62  Temp(Src) 97.3 F (36.3 C) (Oral)  Resp 20  Ht 5\' 4"  (1.626 m)  Wt 160 lb (72.576 kg)  BMI 27.46 kg/m2  SpO2 100%  Physical Exam  Nursing note and vitals reviewed. Constitutional: She appears well-developed and well-nourished. No distress.  HENT:  Head: Normocephalic and atraumatic.  Right Ear: External ear normal.  Left Ear: External ear normal.  Eyes: EOM are normal. Pupils are equal, round, and reactive to light.  Right eye exhibits no chemosis, no discharge, no exudate and no hordeolum. No foreign body present in the right eye. Left eye exhibits no chemosis, no discharge, no exudate and no hordeolum. No foreign body present in the left eye. Right conjunctiva is not injected. Right conjunctiva has no hemorrhage. Left conjunctiva is not injected. Left conjunctiva has no hemorrhage. No scleral icterus. Right eye exhibits normal extraocular motion and no nystagmus.  Slit lamp exam:      The right eye shows no corneal flare, no corneal ulcer, no foreign body and no fluorescein uptake.       The left eye shows no corneal flare, no corneal ulcer and no foreign body.    Neck: Neck supple. No tracheal deviation present.  Cardiovascular: Normal rate.   Pulmonary/Chest: Effort normal. No stridor. No respiratory distress.  Musculoskeletal: She exhibits no edema.  Neurological: She is alert. Cranial nerve deficit: no gross deficits.  Skin: Skin is warm and dry. No rash noted.  Psychiatric: She has a normal mood and affect.    ED Course  Procedures (including critical care time)  Labs Reviewed - No data to display No results found.       MDM  Exam does not suggest a herpes keratitis. I am not sure  if this is a reaction to some debris. Foreign body at this time. At this time there does not appear to be evidence of an acute emergency medical condition. She does not appear to have any loss of vision. Patient will be referred to ophthalmology for evaluation.       Celene Kras, MD 05/19/11 [phone]

## 2011-05-19 NOTE — Discharge Instructions (Signed)
Pain of Unknown Etiology (Pain Without a Known Cause) You have come to your caregiver because of pain. Pain can occur in any part of the body. Often there is not a definite cause. If your laboratory (blood or urine) work was normal and x-rays or other studies were normal, your caregiver may treat you without knowing the cause of the pain. An example of this is the headache. Most headaches are diagnosed by taking a history. This means your caregiver asks you questions about your headaches. Your caregiver determines a treatment based on your answers. Usually testing done for headaches is normal. Often testing is not done unless there is no response to medications. Regardless of where your pain is located today, you can be given medications to make you comfortable. If no physical cause of pain can be found, most cases of pain will gradually leave as suddenly as they came.  If you have a painful condition and no reason can be found for the pain, It is importantthat you follow up with your caregiver. If the pain becomes worse or does not go away, it may be necessary to repeat tests and look further for a possible cause.  Only take over-the-counter or prescription medicines for pain, discomfort, or fever as directed by your caregiver.   For the protection of your privacy, test results can not be given over the phone. Make sure you receive the results of your test. Ask as to how these results are to be obtained if you have not been informed. It is your responsibility to obtain your test results.   You may continue all activities unless the activities cause more pain. When the pain lessens, it is important to gradually resume normal activities. Resume activities by beginning slowly and gradually increasing the intensity and duration of the activities or exercise. During periods of severe pain, bed-rest may be helpful. Lay or sit in any position that is comfortable.   Ice used for acute (sudden) conditions may be  effective. Use a large plastic bag filled with ice and wrapped in a towel. This may provide pain relief.   See your caregiver for continued problems. They can help or refer you for exercises or physical therapy if necessary.  If you were given medications for your condition, do not drive, operate machinery or power tools, or sign legal documents for 24 hours. Do not drink alcohol, take sleeping pills, or take other medications that may interfere with treatment. See your caregiver immediately if you have pain that is becoming worse and not relieved by medications. Document Released: 11/19/2000 Document Revised: 02/13/2011 Document Reviewed: 02/24/2005 [employer] Patient Information 2012 [employer]. 

## 2011-05-19 NOTE — ED Notes (Signed)
Pt.noticed an area on her rt. Eye in the scelera area. Small tiny blister,

## 2011-05-19 NOTE — ED Notes (Signed)
Pt was received to [facility] with c/o rt eye pain onset Friday. Pt claimed that she has a blister in the rt eye and is having a lot of pain from it with blurry vision. Pt is NAD and waiting for ERMD exam

## 2011-07-12 ENCOUNTER — Emergency Department: Payer: Self-pay | Admitting: *Deleted

## 2011-07-12 LAB — COMPREHENSIVE METABOLIC PANEL
Albumin: 4.5 g/dL (ref 3.4–5.0)
Alkaline Phosphatase: 62 U/L (ref 50–136)
Anion Gap: 9 (ref 7–16)
Bilirubin,Total: 0.3 mg/dL (ref 0.2–1.0)
Calcium, Total: 9.3 mg/dL (ref 8.5–10.1)
Chloride: 108 mmol/L — ABNORMAL HIGH (ref 98–107)
Co2: 26 mmol/L (ref 21–32)
Creatinine: 0.83 mg/dL (ref 0.60–1.30)
EGFR (African American): >60
Glucose: 95 mg/dL (ref 65–99)
Osmolality: 286 (ref 275–301)
SGOT(AST): 25 U/L (ref 15–37)
Total Protein: 8.2 g/dL (ref 6.4–8.2)

## 2011-07-12 LAB — CBC
HCT: 45 % (ref 35.0–47.0)
MCH: 30.9 pg (ref 26.0–34.0)
MCHC: 33.9 g/dL (ref 32.0–36.0)
RBC: 4.92 10*6/uL (ref 3.80–5.20)

## 2011-07-12 LAB — CK TOTAL AND CKMB (NOT AT ARMC): CK, Total: 101 U/L (ref 21–215)

## 2011-07-12 LAB — TROPONIN I: Troponin-I: <0.02 ng/mL

## 2011-07-13 LAB — LIPASE, BLOOD: Lipase: 110 U/L (ref 73–393)

## 2011-07-13 LAB — URINALYSIS, COMPLETE
Glucose,UR: NEGATIVE mg/dL (ref 0–75)
Ketone: NEGATIVE
Nitrite: POSITIVE
Protein: NEGATIVE

## 2011-07-15 ENCOUNTER — Emergency Department (HOSPITAL_COMMUNITY): Payer: Self-pay

## 2011-07-15 ENCOUNTER — Encounter (HOSPITAL_COMMUNITY): Payer: Self-pay | Admitting: *Deleted

## 2011-07-15 ENCOUNTER — Emergency Department (HOSPITAL_COMMUNITY)
Admission: EM | Admit: 2011-07-15 | Discharge: 2011-07-15 | Disposition: A | Discharge status: Home / Self Care | Payer: Self-pay | Attending: Emergency Medicine | Admitting: Emergency Medicine

## 2011-07-15 DIAGNOSIS — S0033XA Contusion of nose, initial encounter: Secondary | ICD-10-CM

## 2011-07-15 DIAGNOSIS — Y9210 Unspecified residential institution as the place of occurrence of the external cause: Secondary | ICD-10-CM | POA: Insufficient documentation

## 2011-07-15 DIAGNOSIS — R42 Dizziness and giddiness: Secondary | ICD-10-CM | POA: Insufficient documentation

## 2011-07-15 DIAGNOSIS — M25569 Pain in unspecified knee: Secondary | ICD-10-CM | POA: Insufficient documentation

## 2011-07-15 DIAGNOSIS — R51 Headache: Secondary | ICD-10-CM | POA: Insufficient documentation

## 2011-07-15 DIAGNOSIS — M545 Low back pain, unspecified: Secondary | ICD-10-CM | POA: Insufficient documentation

## 2011-07-15 DIAGNOSIS — W19XXXA Unspecified fall, initial encounter: Secondary | ICD-10-CM

## 2011-07-15 DIAGNOSIS — S0093XA Contusion of unspecified part of head, initial encounter: Secondary | ICD-10-CM

## 2011-07-15 DIAGNOSIS — R112 Nausea with vomiting, unspecified: Secondary | ICD-10-CM | POA: Insufficient documentation

## 2011-07-15 DIAGNOSIS — S0993XA Unspecified injury of face, initial encounter: Secondary | ICD-10-CM | POA: Insufficient documentation

## 2011-07-15 DIAGNOSIS — S8000XA Contusion of unspecified knee, initial encounter: Secondary | ICD-10-CM | POA: Insufficient documentation

## 2011-07-15 DIAGNOSIS — W11XXXA Fall on and from ladder, initial encounter: Secondary | ICD-10-CM | POA: Insufficient documentation

## 2011-07-15 DIAGNOSIS — S0003XA Contusion of scalp, initial encounter: Secondary | ICD-10-CM | POA: Insufficient documentation

## 2011-07-15 DIAGNOSIS — S20229A Contusion of unspecified back wall of thorax, initial encounter: Secondary | ICD-10-CM | POA: Insufficient documentation

## 2011-07-15 DIAGNOSIS — S8002XA Contusion of left knee, initial encounter: Secondary | ICD-10-CM

## 2011-07-15 LAB — DIFFERENTIAL
Basophils Relative: 0 % (ref 0–1)
Eosinophils Absolute: 0.2 10*3/uL (ref 0.0–0.7)
Monocytes Absolute: 0.8 10*3/uL (ref 0.1–1.0)
Monocytes Relative: 6 % (ref 3–12)
Neutro Abs: 10.3 10*3/uL — ABNORMAL HIGH (ref 1.7–7.7)

## 2011-07-15 LAB — CBC
HCT: 40.9 % (ref 36.0–46.0)
Hemoglobin: 13.6 g/dL (ref 12.0–15.0)
MCH: 30.1 pg (ref 26.0–34.0)
MCHC: 33.3 g/dL (ref 30.0–36.0)
MCV: 90.5 fL (ref 78.0–100.0)

## 2011-07-15 LAB — URINE CULTURE

## 2011-07-15 LAB — POCT I-STAT, CHEM 8
Calcium, Ion: 1.24 mmol/L (ref 1.12–1.32)
Chloride: 107 mEq/L (ref 96–112)
Glucose, Bld: 114 mg/dL — ABNORMAL HIGH (ref 70–99)
HCT: 41 % (ref 36.0–46.0)

## 2011-07-15 MED ORDER — DIPHENHYDRAMINE HCL 50 MG/ML IJ SOLN
25.0000 mg | Freq: Once | INTRAMUSCULAR | Status: AC
  Administered 2011-07-15: 25 mg via INTRAVENOUS
  Filled 2011-07-15: qty 1

## 2011-07-15 MED ORDER — OXYCODONE-ACETAMINOPHEN 5-325 MG PO TABS: 1.0000 | ORAL_TABLET | ORAL | Status: AC | PRN

## 2011-07-15 MED ORDER — METOCLOPRAMIDE HCL 5 MG/ML IJ SOLN
10.0000 mg | Freq: Once | INTRAMUSCULAR | Status: AC
  Administered 2011-07-15: 10 mg via INTRAVENOUS
  Filled 2011-07-15: qty 2

## 2011-07-15 MED ORDER — HYDROMORPHONE HCL PF 1 MG/ML IJ SOLN
1.0000 mg | Freq: Once | INTRAMUSCULAR | Status: AC
  Administered 2011-07-15: 1 mg via INTRAVENOUS
  Filled 2011-07-15: qty 1

## 2011-07-15 MED ORDER — SODIUM CHLORIDE 0.9 % IV SOLN
Freq: Once | INTRAVENOUS | Status: AC
  Administered 2011-07-15: 18:00:00 via INTRAVENOUS

## 2011-07-15 MED ORDER — IOHEXOL 300 MG/ML  SOLN
100.0000 mL | Freq: Once | INTRAMUSCULAR | Status: AC | PRN
  Administered 2011-07-15: 100 mL via INTRAVENOUS

## 2011-07-15 NOTE — ED Provider Notes (Signed)
History  This chart was scribed for Dione Booze, MD by Bennett Scrape. This patient was seen in room STRE2/STRE2 and the patient's care was started at 5:03PM.  CSN: [id number]  Arrival date & time 07/15/11  1436   First MD Initiated Contact with Patient 07/15/11 1703      Chief Complaint  Patient presents with  . Dizziness  . Abdominal Pain  . Headache  . Fall    The history is provided by the patient. No language interpreter was used.    Courtney [name] is a 41 y.o. 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 Pt states that she felt dizzy with associated have nausea and emesis for approximately 30 minutes prior to the fall. Even though she felt dizzy, she states that she started to climb up a ladder when she fell down five steps. She states that she hit her nose and head on the way down landing on her lower back. She c/o lumbar back pain, nose pain and HA. She rates her pain a 12 out of 10 currently. She rerports 2 seconds of LOC. She reports that she has continued to have dizziness, nausea and emesis after the fall. She reports x7 episodes of non-bloody emesis since fall. She denies diarrhea, weakness or fever as associated symptoms. She also has a h/o kidney stones and scoliosis.  No PCP.   Past Medical History  Diagnosis Date  . Scoliosis   . DJD (degenerative joint disease)   . Kidney stones     Past Surgical History  Procedure Date  . Abdominal hysterectomy     No family history on file.  History  Substance Use Topics  . Smoking status: Current Everyday Smoker -- 0.5 packs/day  . Smokeless tobacco: Not on file  . Alcohol Use: No     Review of Systems  Constitutional: Negative for fever and chills.  Respiratory: Negative for cough and shortness of breath.   Gastrointestinal: Positive for nausea, vomiting and abdominal pain. Negative for diarrhea.    Musculoskeletal: Positive for back pain.  Neurological: Positive for dizziness and headaches. Negative for weakness.    Allergies  Morphine and related; Penicillins; and Zofran  Home Medications   Current Outpatient Rx  Name Route Sig Dispense Refill  . ESTROGENS CONJUGATED 0.45 MG PO TABS Oral Take 0.45 mg by mouth daily.     . ETODOLAC 500 MG PO TABS Oral Take 500 mg by mouth 2 (two) times daily.      Triage Vitals: BP 116/67  Pulse 97  Temp(Src) 98.1 F (36.7 C) (Oral)  Resp 20  Ht 5\' 4"  (1.626 m)  Wt 160 lb (72.576 kg)  BMI 27.46 kg/m2  SpO2 94%  Physical Exam  Nursing note and vitals reviewed. Constitutional: She is oriented to person, place, and time. She appears well-developed and well-nourished. No distress.       Pt is moaning in pain  HENT:  Head: Normocephalic and atraumatic.       TMs are clear bilaterally, nose is tender at the bridge of the nose without swelling, no septal deviation, no bleeding  Eyes: EOM are normal.  Neck: Normal range of motion. No tracheal deviation present.       Mild tenderness along the neck  Cardiovascular: Normal rate.   Pulmonary/Chest: Effort normal. No respiratory distress.  Abdominal: Soft. There is tenderness.  Diffusely tender throughout the abdomen  Musculoskeletal: Normal range of motion. She exhibits tenderness.       Moderate tenderness throughout the lumbar spine, mild tenderness to the left knee, no swelling, no joint instability, ecchymosis present which appears to be about 44 days old  Neurological: She is alert and oriented to person, place, and time.  Skin: Skin is warm and dry.  Psychiatric: She has a normal mood and affect. Her behavior is normal.    ED Course  Procedures (including critical care time)  DIAGNOSTIC STUDIES: Oxygen Saturation is 94% on room air, adequate by my interpretation.    COORDINATION OF CARE: 5:08PM-Discussed x-rays, pain medications and antinausea with pt and pt  agreed. 7:41PM-Discussed negative radiology reports with pt and pt acknowledged reports. Will prescribe pain medication, provide work note for 2 days and provide a referral to an orthopedist for pt to follow up with. Results for orders placed during the hospital encounter of 07/15/11  CBC      Component Value Range   WBC 13.5 (*) 4.0 - 10.5 (K/uL)   RBC 4.52  3.87 - 5.11 (MIL/uL)   Hemoglobin 13.6  12.0 - 15.0 (g/dL)   HCT 16.1  09.6 - 04.5 (%)   MCV 90.5  78.0 - 100.0 (fL)   MCH 30.1  26.0 - 34.0 (pg)   MCHC 33.3  30.0 - 36.0 (g/dL)   RDW 40.9  81.1 - 91.4 (%)   Platelets 232  150 - 400 (K/uL)  DIFFERENTIAL      Component Value Range   Neutrophils Relative 76  43 - 77 (%)   Neutro Abs 10.3 (*) 1.7 - 7.7 (K/uL)   Lymphocytes Relative 16  12 - 46 (%)   Lymphs Abs 2.2  0.7 - 4.0 (K/uL)   Monocytes Relative 6  3 - 12 (%)   Monocytes Absolute 0.8  0.1 - 1.0 (K/uL)   Eosinophils Relative 1  0 - 5 (%)   Eosinophils Absolute 0.2  0.0 - 0.7 (K/uL)   Basophils Relative 0  0 - 1 (%)   Basophils Absolute 0.0  0.0 - 0.1 (K/uL)  POCT I-STAT, CHEM 8      Component Value Range   Sodium 143  135 - 145 (mEq/L)   Potassium 3.8  3.5 - 5.1 (mEq/L)   Chloride 107  96 - 112 (mEq/L)   BUN 15  6 - 23 (mg/dL)   Creatinine, Ser 7.82  0.50 - 1.10 (mg/dL)   Glucose, Bld 956 (*) 70 - 99 (mg/dL)   Calcium, Ion 2.13  0.86 - 1.32 (mmol/L)   TCO2 27  0 - 100 (mmol/L)   Hemoglobin 13.9  12.0 - 15.0 (g/dL)   HCT 57.8  46.9 - 62.9 (%)   Ct Head Wo Contrast  07/15/2011  *RADIOLOGY REPORT*  Clinical Data:  Fell, head pain, neck pain, facial injury.  Slight loss of consciousness.  Dizziness, with nausea and vomiting.  CT HEAD WITHOUT CONTRAST CT MAXILLOFACIAL WITHOUT CONTRAST CT CERVICAL SPINE WITHOUT CONTRAST  Technique:  Multidetector CT imaging of the head, cervical spine, and maxillofacial structures were performed using the standard protocol without intravenous contrast. Multiplanar CT image reconstructions of  the cervical spine and maxillofacial structures were also generated.  Comparison:   None  CT HEAD  Findings: There is no evidence for acute infarction, intracranial hemorrhage, mass lesion, hydrocephalus, or extra-axial fluid. There is no atrophy or white matter disease.  The calvarium is intact. No acute mastoid fluid.  IMPRESSION: Negative exam.  CT MAXILLOFACIAL  Findings:  There is no visible facial fracture.  There is no acute sinus fluid.  No radiopaque foreign body is seen.  Moderate nasal septal deviation right-to-left is chronic.  Chronic deformity left maxillary sinus from previous surgery.  IMPRESSION: Negative exam.  No acute facial fracture.  CT CERVICAL SPINE  Findings:   No visible cervical spine fracture or traumatic subluxation.  Slight reversal normal cervical lordotic curve could be positional or due to spasm.  No prevertebral soft tissue swelling or intraspinal hematoma.  Clear lung apices.  No neck masses.  Airway midline.  IMPRESSION: Slight reversal normal cervical lordotic curve could be positional or due to spasm.  There is no fracture or traumatic subluxation.  Original Report Authenticated By: Elsie Stain, M.D.   Ct Cervical Spine Wo Contrast  07/15/2011  *RADIOLOGY REPORT*  Clinical Data:  Larey Seat, head pain, neck pain, facial injury.  Slight loss of consciousness.  Dizziness, with nausea and vomiting.  CT HEAD WITHOUT CONTRAST CT MAXILLOFACIAL WITHOUT CONTRAST CT CERVICAL SPINE WITHOUT CONTRAST  Technique:  Multidetector CT imaging of the head, cervical spine, and maxillofacial structures were performed using the standard protocol without intravenous contrast. Multiplanar CT image reconstructions of the cervical spine and maxillofacial structures were also generated.  Comparison:   None  CT HEAD  Findings: There is no evidence for acute infarction, intracranial hemorrhage, mass lesion, hydrocephalus, or extra-axial fluid. There is no atrophy or white matter disease.  The calvarium is  intact. No acute mastoid fluid.  IMPRESSION: Negative exam.  CT MAXILLOFACIAL  Findings:  There is no visible facial fracture.  There is no acute sinus fluid.  No radiopaque foreign body is seen.  Moderate nasal septal deviation right-to-left is chronic.  Chronic deformity left maxillary sinus from previous surgery.  IMPRESSION: Negative exam.  No acute facial fracture.  CT CERVICAL SPINE  Findings:   No visible cervical spine fracture or traumatic subluxation.  Slight reversal normal cervical lordotic curve could be positional or due to spasm.  No prevertebral soft tissue swelling or intraspinal hematoma.  Clear lung apices.  No neck masses.  Airway midline.  IMPRESSION: Slight reversal normal cervical lordotic curve could be positional or due to spasm.  There is no fracture or traumatic subluxation.  Original Report Authenticated By: Elsie Stain, M.D.   Ct Abdomen Pelvis W Contrast  07/15/2011  *RADIOLOGY REPORT*  Clinical Data: Dizziness.  Abdominal pain.  Headache.  Fall.  CT ABDOMEN AND PELVIS WITH CONTRAST  Technique:  Multidetector CT imaging of the abdomen and pelvis was performed following the standard protocol during bolus administration of intravenous contrast.  Contrast: OMNIPAQUE IOHEXOL 300 MG/ML  SOLN  Comparison: 03/10/2011.  Findings: The lungs show dependent atelectasis.  Visualized portions of the heart are normal.  The liver appears within normal limits.  Gallbladder appears normal.  Spleen appears normal. Stomach and small bowel normal.  Adrenal glands normal.  Normal renal enhancement.  Normal delayed excretion of contrast from the kidneys.  Urinary bladder appears normal.  Hysterectomy.  No free fluid.  No lymphadenopathy.  Normal delayed excretion of contrast in the kidneys.  L1-L2 diminutive disc space, compatible with congenital segmentation anomaly.  Ankylosis of the left facet joints. Levoconvex curvature of the thoracolumbar spine.  No aggressive osseous lesions.  IMPRESSION:  No acute abnormality.  Original Report Authenticated By: Andreas Newport, M.D.   Dg Knee Complete 4 Views Left  07/15/2011  *RADIOLOGY REPORT*  Clinical  Data: Fall, pain  LEFT KNEE - COMPLETE 4+ VIEW  Comparison:  None.  Findings:  There is no evidence of fracture, dislocation, or joint effusion.  There is no evidence of arthropathy or other focal bone abnormality.  Soft tissues are unremarkable.  IMPRESSION: Negative.  Original Report Authenticated By: Elsie Stain, M.D.   Ct Maxillofacial Wo Cm  07/15/2011  *RADIOLOGY REPORT*  Clinical Data:  Larey Seat, head pain, neck pain, facial injury.  Slight loss of consciousness.  Dizziness, with nausea and vomiting.  CT HEAD WITHOUT CONTRAST CT MAXILLOFACIAL WITHOUT CONTRAST CT CERVICAL SPINE WITHOUT CONTRAST  Technique:  Multidetector CT imaging of the head, cervical spine, and maxillofacial structures were performed using the standard protocol without intravenous contrast. Multiplanar CT image reconstructions of the cervical spine and maxillofacial structures were also generated.  Comparison:   None  CT HEAD  Findings: There is no evidence for acute infarction, intracranial hemorrhage, mass lesion, hydrocephalus, or extra-axial fluid. There is no atrophy or white matter disease.  The calvarium is intact. No acute mastoid fluid.  IMPRESSION: Negative exam.  CT MAXILLOFACIAL  Findings:  There is no visible facial fracture.  There is no acute sinus fluid.  No radiopaque foreign body is seen.  Moderate nasal septal deviation right-to-left is chronic.  Chronic deformity left maxillary sinus from previous surgery.  IMPRESSION: Negative exam.  No acute facial fracture.  CT CERVICAL SPINE  Findings:   No visible cervical spine fracture or traumatic subluxation.  Slight reversal normal cervical lordotic curve could be positional or due to spasm.  No prevertebral soft tissue swelling or intraspinal hematoma.  Clear lung apices.  No neck masses.  Airway midline.  IMPRESSION: Slight  reversal normal cervical lordotic curve could be positional or due to spasm.  There is no fracture or traumatic subluxation.  Original Report Authenticated By: Elsie Stain, M.D.       1. Fall   2. Contusion, nose   3. Contusion of head   4. Contusion, back   5. Contusion of knee, left       MDM  Fall with no evidence of acute bony injury. Patient be sent for CT scans and plain x-rays.   I personally performed the services described in this documentation, which was scribed in my presence. The recorded information has been reviewed and considered.         Dione Booze, MD 07/20/11 [phone]

## 2011-07-15 NOTE — ED Notes (Signed)
Patient reports she was working here on the addition,  She was up on a ladder,  She had been dizzy for approx 30 min prior to falling.  She fell down 5 steps off ladder.  Patient states she continues to be dizzy,  She has headache and abd pain.  Patient reports she has had n/v before and after he fall

## 2011-07-15 NOTE — Discharge Instructions (Signed)
Followup at [facility] tomorrow. Call in the morning for an appointment time. Take Tylenol or ibuprofen for less severe pain. Take Percocet for more severe pain.  Contusion A contusion is a deep bruise. Contusions are the result of an injury that caused bleeding under the skin. The contusion may turn blue, purple, or yellow. Minor injuries will give you a painless contusion, but more severe contusions may stay painful and swollen for a few weeks.  CAUSES  A contusion is usually caused by a blow, trauma, or direct force to an area of the body. SYMPTOMS   Swelling and redness of the injured area.   Bruising of the injured area.   Tenderness and soreness of the injured area.   Pain.  DIAGNOSIS  The diagnosis can be made by taking a history and physical exam. An X-ray, CT scan, or MRI may be needed to determine if there were any associated injuries, such as fractures. TREATMENT  Specific treatment will depend on what area of the body was injured. In general, the best treatment for a contusion is resting, icing, elevating, and applying cold compresses to the injured area. Over-the-counter medicines may also be recommended for pain control. Ask your caregiver what the best treatment is for your contusion. HOME CARE INSTRUCTIONS   Put ice on the injured area.   Put ice in a plastic bag.   Place a towel between your skin and the bag.   Leave the ice on for 15 to 20 minutes, 3 to 4 times a day.   Only take over-the-counter or prescription medicines for pain, discomfort, or fever as directed by your caregiver. Your caregiver may recommend avoiding anti-inflammatory medicines (aspirin, ibuprofen, and naproxen) for 48 hours because these medicines may increase bruising.   Rest the injured area.   If possible, elevate the injured area to reduce swelling.  SEEK IMMEDIATE MEDICAL CARE IF:   You have increased bruising or swelling.   You have pain that is getting  worse.   Your swelling or pain is not relieved with medicines.  MAKE SURE YOU:   Understand these instructions.   Will watch your condition.   Will get help right away if you are not doing well or get worse.  Document Released: 12/04/2004 Document Revised: 02/13/2011 Document Reviewed: 12/30/2010 [facility] Patient Information 2012 [place], [place].  Acetaminophen; Oxycodone tablets What is this medicine? ACETAMINOPHEN; OXYCODONE (a set a MEE noe fen; ox i KOE done) is a pain reliever. It is used to treat mild to moderate pain. This medicine may be used for other purposes; ask your health care provider or pharmacist if you have questions. What should I tell my health care provider before I take this medicine? They need to know if you have any of these conditions: -brain tumor -Crohn's disease, inflammatory bowel disease, or ulcerative colitis -drink more than 3 alcohol containing drinks per day -drug abuse or addiction -head injury -heart or circulation problems -kidney disease or problems going to the bathroom -liver disease -lung disease, asthma, or breathing problems -an unusual or allergic reaction to acetaminophen, oxycodone, other opioid analgesics, other medicines, foods, dyes, or preservatives -pregnant or trying to get pregnant -breast-feeding How should I use this medicine? Take this medicine by mouth with a full glass of water. Follow the directions on the prescription label. Take your medicine at regular intervals. Do not take your medicine more often than directed. Talk to your pediatrician regarding the use of this medicine in children. Special  care may be needed. Patients over 40 years old may have a stronger reaction and need a smaller dose. Overdosage: If you think you have taken too much of this medicine contact a poison control center or emergency room at once. NOTE: This medicine is only for you. Do not share this medicine with others. What if I miss a dose? If  you miss a dose, take it as soon as you can. If it is almost time for your next dose, take only that dose. Do not take double or extra doses. What may interact with this medicine? -alcohol or medicines that contain alcohol -antihistamines -barbiturates like amobarbital, butalbital, butabarbital, methohexital, pentobarbital, phenobarbital, thiopental, and secobarbital -benztropine -drugs for bladder problems like solifenacin, trospium, oxybutynin, tolterodine, hyoscyamine, and methscopolamine -drugs for breathing problems like ipratropium and tiotropium -drugs for certain stomach or intestine problems like propantheline, homatropine methylbromide, glycopyrrolate, atropine, belladonna, and dicyclomine -general anesthetics like etomidate, ketamine, nitrous oxide, propofol, desflurane, enflurane, halothane, isoflurane, and sevoflurane -medicines for depression, anxiety, or psychotic disturbances -medicines for pain like codeine, morphine, pentazocine, buprenorphine, butorphanol, nalbuphine, tramadol, and propoxyphene -medicines for sleep -muscle relaxants -naltrexone -phenothiazines like perphenazine, thioridazine, chlorpromazine, mesoridazine, fluphenazine, prochlorperazine, promazine, and trifluoperazine -scopolamine -trihexyphenidyl This list may not describe all possible interactions. Give your health care provider a list of all the medicines, herbs, non-prescription drugs, or dietary supplements you use. Also tell them if you smoke, drink alcohol, or use illegal drugs. Some items may interact with your medicine. What should I watch for while using this medicine? Tell your doctor or health care professional if your pain does not go away, if it gets worse, or if you have new or a different type of pain. You may develop tolerance to the medicine. Tolerance means that you will need a higher dose of the medication for pain relief. Tolerance is normal and is expected if you take this medicine for a  long time. Do not suddenly stop taking your medicine because you may develop a severe reaction. Your body becomes used to the medicine. This does NOT mean you are addicted. Addiction is a behavior related to getting and using a drug for a nonmedical reason. If you have pain, you have a medical reason to take pain medicine. Your doctor will tell you how much medicine to take. If your doctor wants you to stop the medicine, the dose will be slowly lowered over time to avoid any side effects. You may get drowsy or dizzy. Do not drive, use machinery, or do anything that needs mental alertness until you know how this medicine affects you. Do not stand or sit up quickly, especially if you are an older patient. This reduces the risk of dizzy or fainting spells. Alcohol may interfere with the effect of this medicine. Avoid alcoholic drinks. The medicine will cause constipation. Try to have a bowel movement at least every 2 to 3 days. If you do not have a bowel movement for 3 days, call your doctor or health care professional. Do not take Tylenol (acetaminophen) or medicines that have acetaminophen with this medicine. Too much acetaminophen can be very dangerous. Many nonprescription medicines contain acetaminophen. Always read the labels carefully to avoid taking more acetaminophen. What side effects may I notice from receiving this medicine? Side effects that you should report to your doctor or health care professional as soon as possible: -allergic reactions like skin rash, itching or hives, swelling of the face, lips, or tongue -breathing difficulties, wheezing -confusion -light headedness or fainting  spells -severe stomach pain -yellowing of the skin or the whites of the eyes Side effects that usually do not require medical attention (report to your doctor or health care professional if they continue or are bothersome): -dizziness -drowsiness -nausea -vomiting This list may not describe all possible  side effects. Call your doctor for medical advice about side effects. You may report side effects to FDA at 1-800-FDA-1088. Where should I keep my medicine? Keep out of the reach of children. This medicine can be abused. Keep your medicine in a safe place to protect it from theft. Do not share this medicine with anyone. Selling or giving away this medicine is dangerous and against the law. Store at room temperature between 20 and 25 degrees C (68 and 77 degrees F). Keep container tightly closed. Protect from light. Flush any unused medicines down the toilet. Do not use the medicine after the expiration date. NOTE: This sheet is a summary. It may not cover all possible information. If you have questions about this medicine, talk to your doctor, pharmacist, or health care provider.  2012, [employer]. (01/24/2008 10:01:21 AM)

## 2011-08-02 ENCOUNTER — Emergency Department: Payer: Self-pay | Admitting: Emergency Medicine

## 2011-08-02 LAB — COMPREHENSIVE METABOLIC PANEL
Albumin: 3.6 g/dL (ref 3.4–5.0)
Anion Gap: 12 (ref 7–16)
BUN: 11 mg/dL (ref 7–18)
Calcium, Total: 8.6 mg/dL (ref 8.5–10.1)
Chloride: 105 mmol/L (ref 98–107)
Creatinine: 0.67 mg/dL (ref 0.60–1.30)
EGFR (Non-African Amer.): >60
Potassium: 3.5 mmol/L (ref 3.5–5.1)
SGOT(AST): 20 U/L (ref 15–37)

## 2011-08-02 LAB — CBC
HGB: 14.1 g/dL (ref 12.0–16.0)
MCH: 31.3 pg (ref 26.0–34.0)
MCHC: 33.6 g/dL (ref 32.0–36.0)
RDW: 13 % (ref 11.5–14.5)
WBC: 7.2 10*3/uL (ref 3.6–11.0)

## 2011-08-09 ENCOUNTER — Emergency Department (HOSPITAL_COMMUNITY)
Admission: EM | Admit: 2011-08-09 | Discharge: 2011-08-09 | Disposition: A | Discharge status: Home / Self Care | Payer: Self-pay | Attending: Emergency Medicine | Admitting: Emergency Medicine

## 2011-08-09 ENCOUNTER — Encounter (HOSPITAL_COMMUNITY): Payer: Self-pay | Admitting: *Deleted

## 2011-08-09 ENCOUNTER — Emergency Department (HOSPITAL_COMMUNITY): Payer: Self-pay

## 2011-08-09 DIAGNOSIS — T7491XA Unspecified adult maltreatment, confirmed, initial encounter: Secondary | ICD-10-CM | POA: Insufficient documentation

## 2011-08-09 DIAGNOSIS — M549 Dorsalgia, unspecified: Secondary | ICD-10-CM | POA: Insufficient documentation

## 2011-08-09 DIAGNOSIS — R109 Unspecified abdominal pain: Secondary | ICD-10-CM | POA: Insufficient documentation

## 2011-08-09 DIAGNOSIS — R51 Headache: Secondary | ICD-10-CM | POA: Insufficient documentation

## 2011-08-09 DIAGNOSIS — M542 Cervicalgia: Secondary | ICD-10-CM | POA: Insufficient documentation

## 2011-08-09 DIAGNOSIS — T7492XA Unspecified child maltreatment, confirmed, initial encounter: Secondary | ICD-10-CM | POA: Insufficient documentation

## 2011-08-09 DIAGNOSIS — IMO0002 Reserved for concepts with insufficient information to code with codable children: Secondary | ICD-10-CM | POA: Insufficient documentation

## 2011-08-09 LAB — URINALYSIS, ROUTINE W REFLEX MICROSCOPIC
Bilirubin Urine: NEGATIVE
Glucose, UA: NEGATIVE mg/dL
Ketones, ur: NEGATIVE mg/dL
Leukocytes, UA: NEGATIVE
Nitrite: NEGATIVE
Specific Gravity, Urine: 1.026 (ref 1.005–1.030)
pH: 6 (ref 5.0–8.0)

## 2011-08-09 LAB — BASIC METABOLIC PANEL
CO2: 27 mEq/L (ref 19–32)
Chloride: 104 mEq/L (ref 96–112)
GFR calc Af Amer: >90 mL/min (ref >90)
Potassium: 3.7 mEq/L (ref 3.5–5.1)
Sodium: 140 mEq/L (ref 135–145)

## 2011-08-09 LAB — DIFFERENTIAL
Basophils Absolute: 0 10*3/uL (ref 0.0–0.1)
Eosinophils Relative: 1 % (ref 0–5)
Lymphocytes Relative: 23 % (ref 12–46)
Lymphs Abs: 1.9 10*3/uL (ref 0.7–4.0)
Monocytes Absolute: 0.5 10*3/uL (ref 0.1–1.0)
Neutro Abs: 5.8 10*3/uL (ref 1.7–7.7)

## 2011-08-09 LAB — CBC
HCT: 43.4 % (ref 36.0–46.0)
MCV: 91.2 fL (ref 78.0–100.0)
RBC: 4.76 MIL/uL (ref 3.87–5.11)
WBC: 8.4 10*3/uL (ref 4.0–10.5)

## 2011-08-09 MED ORDER — HYDROMORPHONE HCL PF 2 MG/ML IJ SOLN
1.5000 mg | Freq: Once | INTRAMUSCULAR | Status: AC
  Administered 2011-08-09: 1.5 mg via INTRAVENOUS
  Filled 2011-08-09: qty 1

## 2011-08-09 MED ORDER — PROMETHAZINE HCL 25 MG/ML IJ SOLN
12.5000 mg | Freq: Once | INTRAMUSCULAR | Status: AC
  Administered 2011-08-09: 12.5 mg via INTRAVENOUS
  Filled 2011-08-09: qty 1

## 2011-08-09 MED ORDER — IOHEXOL 300 MG/ML  SOLN
100.0000 mL | Freq: Once | INTRAMUSCULAR | Status: AC | PRN
  Administered 2011-08-09: 100 mL via INTRAVENOUS

## 2011-08-09 MED ORDER — HYDROMORPHONE HCL PF 2 MG/ML IJ SOLN
2.0000 mg | Freq: Once | INTRAMUSCULAR | Status: AC
  Administered 2011-08-09: 2 mg via INTRAVENOUS
  Filled 2011-08-09: qty 1
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
## 2011-08-09 NOTE — ED Notes (Signed)
Patient states that she still can not use the restroom.

## 2011-08-10 ENCOUNTER — Emergency Department (HOSPITAL_COMMUNITY)
Admission: EM | Admit: 2011-08-10 | Discharge: 2011-08-10 | Disposition: A | Discharge status: Home / Self Care | Payer: Self-pay | Attending: Emergency Medicine | Admitting: Emergency Medicine

## 2011-08-10 ENCOUNTER — Encounter (HOSPITAL_COMMUNITY): Payer: Self-pay | Admitting: *Deleted

## 2011-08-10 DIAGNOSIS — K625 Hemorrhage of anus and rectum: Secondary | ICD-10-CM | POA: Insufficient documentation

## 2011-08-10 DIAGNOSIS — K921 Melena: Secondary | ICD-10-CM | POA: Insufficient documentation

## 2011-08-10 DIAGNOSIS — R112 Nausea with vomiting, unspecified: Secondary | ICD-10-CM | POA: Insufficient documentation

## 2011-08-10 DIAGNOSIS — M549 Dorsalgia, unspecified: Secondary | ICD-10-CM | POA: Insufficient documentation

## 2011-08-10 LAB — POCT I-STAT, CHEM 8
BUN: 11 mg/dL (ref 6–23)
Calcium, Ion: 1.19 mmol/L (ref 1.12–1.32)
Chloride: 107 meq/L (ref 96–112)
Creatinine, Ser: 0.7 mg/dL (ref 0.50–1.10)
Glucose, Bld: 83 mg/dL (ref 70–99)
HCT: 45 % (ref 36.0–46.0)
Hemoglobin: 15.3 g/dL — ABNORMAL HIGH (ref 12.0–15.0)
Potassium: 3.4 meq/L — ABNORMAL LOW (ref 3.5–5.1)
Sodium: 143 meq/L (ref 135–145)
TCO2: 26 mmol/L (ref 0–100)

## 2011-08-10 LAB — URINALYSIS, ROUTINE W REFLEX MICROSCOPIC
Bilirubin Urine: NEGATIVE
Glucose, UA: NEGATIVE mg/dL
Hgb urine dipstick: NEGATIVE
Ketones, ur: NEGATIVE mg/dL
Leukocytes, UA: NEGATIVE
Nitrite: NEGATIVE
Protein, ur: 30 mg/dL — AB
Specific Gravity, Urine: 1.021 (ref 1.005–1.030)
Urobilinogen, UA: 0.2 mg/dL (ref 0.0–1.0)
pH: 8.5 — ABNORMAL HIGH (ref 5.0–8.0)

## 2011-08-10 LAB — PREGNANCY, URINE: Preg Test, Ur: NEGATIVE

## 2011-08-10 LAB — URINE MICROSCOPIC-ADD ON

## 2011-08-10 MED ORDER — PROMETHAZINE HCL 25 MG/ML IJ SOLN
12.5000 mg | Freq: Once | INTRAMUSCULAR | Status: AC
  Administered 2011-08-10: 12.5 mg via INTRAVENOUS
  Filled 2011-08-10: qty 1

## 2011-08-10 MED ORDER — HYDROMORPHONE HCL PF 1 MG/ML IJ SOLN
1.0000 mg | Freq: Once | INTRAMUSCULAR | Status: AC
  Administered 2011-08-10: 1 mg via INTRAVENOUS
  Filled 2011-08-10: qty 1

## 2011-08-10 MED ORDER — DIAZEPAM 5 MG/ML IJ SOLN
5.0000 mg | Freq: Once | INTRAMUSCULAR | Status: AC
  Administered 2011-08-10: 5 mg via INTRAVENOUS
  Filled 2011-08-10: qty 2

## 2011-08-10 MED ORDER — IBUPROFEN 200 MG PO TABS
400.0000 mg | ORAL_TABLET | Freq: Once | ORAL | Status: AC
  Administered 2011-08-10: 400 mg via ORAL
  Filled 2011-08-10: qty 2

## 2011-08-10 MED ORDER — ONDANSETRON HCL 4 MG/2ML IJ SOLN: 4.0000 mg | Freq: Once | INTRAMUSCULAR | Status: DC

## 2011-08-10 MED ORDER — SODIUM CHLORIDE 0.9 % IV BOLUS (SEPSIS)
1000.0000 mL | Freq: Once | INTRAVENOUS | Status: AC
  Administered 2011-08-10: 1000 mL via INTRAVENOUS
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
## 2011-08-10 NOTE — Discharge Instructions (Signed)
Back Pain, Adult Low back pain is very common. About 1 in 5 people have back pain.The cause of low back pain is rarely dangerous. The pain often gets better over time.About half of people with a sudden onset of back pain feel better in just 2 weeks. About 8 in 10 people feel better by 6 weeks.  CAUSES Some common causes of back pain include:  Strain of the muscles or ligaments supporting the spine.   Wear and tear (degeneration) of the spinal discs.   Arthritis.   Direct injury to the back.  DIAGNOSIS Most of the time, the direct cause of low back pain is not known.However, back pain can be treated effectively even when the exact cause of the pain is unknown.Answering your caregiver's questions about your overall health and symptoms is one of the most accurate ways to make sure the cause of your pain is not dangerous. If your caregiver needs more information, he or she may order lab work or imaging tests (X-rays or MRIs).However, even if imaging tests show changes in your back, this usually does not require surgery. HOME CARE INSTRUCTIONS For many people, back pain returns.Since low back pain is rarely dangerous, it is often a condition that people can learn to manageon their own.   Remain active. It is stressful on the back to sit or stand in one place. Do not sit, drive, or stand in one place for more than 30 minutes at a time. Take short walks on level surfaces as soon as pain allows.Try to increase the length of time you walk each day.   Do not stay in bed.Resting more than 1 or 2 days can delay your recovery.   Do not avoid exercise or work.Your body is made to move.It is not dangerous to be active, even though your back may hurt.Your back will likely heal faster if you return to being active before your pain is gone.   Pay attention to your body when you bend and lift. Many people have less discomfortwhen lifting if they bend their knees, keep the load close to their  bodies,and avoid twisting. Often, the most comfortable positions are those that put less stress on your recovering back.   Find a comfortable position to sleep. Use a firm mattress and lie on your side with your knees slightly bent. If you lie on your back, put a pillow under your knees.   Only take over-the-counter or prescription medicines as directed by your caregiver. Over-the-counter medicines to reduce pain and inflammation are often the most helpful.Your caregiver may prescribe muscle relaxant drugs.These medicines help dull your pain so you can more quickly return to your normal activities and healthy exercise.   Put ice on the injured area.   Put ice in a plastic bag.   Place a towel between your skin and the bag.   Leave the ice on for 15 to 20 minutes, 3 to 4 times a day for the first 2 to 3 days. After that, ice and heat may be alternated to reduce pain and spasms.   Ask your caregiver about trying back exercises and gentle massage. This may be of some benefit.   Avoid feeling anxious or stressed.Stress increases muscle tension and can worsen back pain.It is important to recognize when you are anxious or stressed and learn ways to manage it.Exercise is a great option.  SEEK MEDICAL CARE IF:  You have pain that is not relieved with rest or medicine.   You have   pain that does not improve in 1 week.   You have new symptoms.   You are generally not feeling well.  SEEK IMMEDIATE MEDICAL CARE IF:   You have pain that radiates from your back into your legs.   You develop new bowel or bladder control problems.   You have unusual weakness or numbness in your arms or legs.   You develop nausea or vomiting.   You develop abdominal pain.   You feel faint.  Document Released: 02/24/2005 Document Revised: 02/13/2011 Document Reviewed: 07/15/2010 [facility] Patient Information 2012 [place], [place].Bloody Stools Bloody stools often mean that there is a problem in the  digestive tract. Your caregiver may use the term "melena" to describe black, tarry, and bad smelling stools or "hematochezia" to describe red or maroon-colored stools. Blood seen in the stool can be caused by bleeding anywhere along the intestinal tract.  A black stool usually means that blood is coming from the upper part of the gastrointestinal tract (esophagus, stomach, or small bowel). Passing maroon-colored stools or bright red blood usually means that blood is coming from lower down in the large bowel or the rectum. However, sometimes massive bleeding in the stomach or small intestine can cause bright red bloody stools.  Consuming black licorice, lead, iron pills, medicines containing bismuth subsalicylate, or blueberries can also cause black stools. Your caregiver can test black stools to see if blood is present. It is important that the cause of the bleeding be found. Treatment can then be started, and the problem can be corrected. Rectal bleeding may not be serious, but you should not assume everything is okay until you know the cause.It is very important to follow up with your caregiver or a specialist in gastrointestinal problems. CAUSES  Blood in the stools can come from various underlying causes.Often, the cause is not found during your first visit. Testing is often needed to discover the cause of bleeding in the gastrointestinal tract. Causes range from simple to serious or even life-threatening.Possible causes include:  Hemorrhoids.These are veins that are full of blood (engorged) in the rectum. They cause pain, inflammation, and may bleed.   Anal fissures.These are areas of painful tearing which may bleed. They are often caused by passing hard stool.   Diverticulosis.These are pouches that form on the colon over time, with age, and may bleed significantly.   Diverticulitis.This is inflammation in areas with diverticulosis. It can cause pain, fever, and bloody stools, although  bleeding is rare.   Proctitis and colitis. These are inflamed areas of the rectum or colon. They may cause pain, fever, and bloody stools.   Polyps and cancer. Colon cancer is a leading cause of preventable cancer death.It often starts out as precancerous polyps that can be removed during a colonoscopy, preventing progression into cancer. Sometimes, polyps and cancer may cause rectal bleeding.   Gastritis and ulcers.Bleeding from the upper gastrointestinal tract (near the stomach) may travel through the intestines and produce black, sometimes tarry, often bad smelling stools. In certain cases, if the bleeding is fast enough, the stools may not be black, but red and the condition may be life-threatening.  SYMPTOMS  You may have stools that are bright red and bloody, that are normal color with blood on them, or that are dark black and tarry. In some cases, you may only have blood in the toilet bowl. Any of these cases need medical care. You may also have:  Pain at the anus or anywhere in the rectum.  Lightheadedness or feeling faint.   Extreme weakness.   Nausea or vomiting.   Fever.  DIAGNOSIS Your caregiver may use the following methods to find the cause of your bleeding:  Taking a medical history. Age is important. Older people tend to develop polyps and cancer more often. If there is anal pain and a hard, large stool associated with bleeding, a tear of the anus may be the cause. If blood drips into the toilet after a bowel movement, bleeding hemorrhoids may be the problem. The color and frequency of the bleeding are additional considerations. In most cases, the medical history provides clues, but seldom the final answer.   A visual and finger (digital) exam. Your caregiver will inspect the anal area, looking for tears and hemorrhoids. A finger exam can provide information when there is tenderness or a growth inside. In men, the prostate is also examined.   Endoscopy. Several types of  small, long scopes (endoscopes) are used to view the colon.   In the office, your caregiver may use a rigid, or more commonly, a flexible viewing sigmoidoscope. This exam is called flexible sigmoidoscopy. It is performed in 5 to 10 minutes.   A more thorough exam is accomplished with a colonoscope. It allows your caregiver to view the entire 5 to 6 foot long colon. Medicine to help you relax (sedative) is usually given for this exam. Frequently, a bleeding lesion may be present beyond the reach of the sigmoidoscope. So, a colonoscopy may be the best exam to start with. Both exams are usually done on an outpatient basis. This means the patient does not stay overnight in the hospital or surgery center.   An upper endoscopy may be needed to examine your stomach. Sedation is used and a flexible endoscope is put in your mouth, down to your stomach.   A barium enema X-ray. This is an X-ray exam. It uses liquid barium inserted by enema into the rectum. This test alone may not identify an actual bleeding point. X-rays highlight abnormal shadows, such as those made by lumps (tumors), diverticuli, or colitis.  TREATMENT  Treatment depends on the cause of your bleeding.   For bleeding from the stomach or colon, the caregiver doing your endoscopy or colonoscopy may be able to stop the bleeding as part of the procedure.   Inflammation or infection of the colon can be treated with medicines.   Many rectal problems can be treated with creams, suppositories, or warm baths.   Surgery is sometimes needed.   Blood transfusions are sometimes needed if you have lost a lot of blood.   For any bleeding problem, let your caregiver know if you take aspirin or other blood thinners regularly.  HOME CARE INSTRUCTIONS   Take any medicines exactly as prescribed.   Keep your stools soft by eating a diet high in fiber. Prunes (1 to 3 a day) work well for many people.   Drink enough water and fluids to keep your urine  clear or pale yellow.   Take sitz baths if advised. A sitz bath is when you sit in a bathtub with warm water for 10 to 15 minutes to soak, soothe, and cleanse the rectal area.   If enemas or suppositories are advised, be sure you know how to use them. Tell your caregiver if you have problems with this.   Monitor your bowel movements to look for signs of improvement or worsening.  SEEK MEDICAL CARE IF:   You do not improve in  the time expected.   Your condition worsens after initial improvement.   You develop any new symptoms.  SEEK IMMEDIATE MEDICAL CARE IF:   You develop severe or prolonged rectal bleeding.   You vomit blood.   You feel weak or faint.   You have a fever.  MAKE SURE YOU:  Understand these instructions.   Will watch your condition.   Will get help right away if you are not doing well or get worse.  Document Released: 02/14/2002 Document Revised: 02/13/2011 Document Reviewed: 07/12/2010 [facility] Patient Information 2012 [place], [place].  RESOURCE GUIDE  Chronic Pain Problems: Contact [facility]  [phone] Patients need to be referred by their primary care doctor.  Insufficient Money for Medicine: Contact United Way:  call "211" or [facility] 986-401-1236.  No Primary Care Doctor: - Call [facility]  [phone] - can help you locate a primary care doctor that  accepts your insurance, provides certain services, etc. - Physician Referral Service- [phone]  Agencies that provide inexpensive medical care: - [facility]  [phone] - [facility]  [phone] - [facility]  [phone] - Women's Clinic  [phone] - [facility]  [phone] [facility]  [phone]  Medicaid-accepting [facility] Providers: - [facility]- [address]  [phone], Mon-Fri 9am-7pm, Sat 9am-1pm - [facility]- [address]  [place], [address] [place]  [phone] - [facility] [address] [place]  [phone] [facility]- [address]  [phone] - Renaye Rakers- [address] [place], [address], [phone]  Only accepts [place] patients after they have their name  applied to their card  Self Pay (no insurance) in [place]: - Sickle Cell Patients: Dr Willey Blade, [facility]  [address] [place], [phone] - [facility]- [address] [place]  [phone]       [facility] [place]- [address]       -     [facility]- see information above (Speak to [employer] if you do not have insurance)       -  [facility]- [address] [place], [phone]       -  [facility]- [address],  [phone]       -  [facility]- [address], [phone]       -  Dr Julio Sicks-  [address], [place], [phone]       -  [facility]- [address], [phone]       -  [facility]- [address] [phone], also [address] [phone]       -    [facility]- [address] [place], [phone], 1st & 3rd Saturday   every month, 10am-1pm  1) Find a Doctor and Pay Out of Pocket Although you won't have to find out who is covered by your insurance plan, it is a good idea to ask around and get recommendations. You will then need to call the office and see if the doctor you have chosen will accept you as a new patient and what types of options they offer for patients who are self-pay. Some doctors offer discounts or will set  up payment plans for their patients who do not have insurance, but you will need to ask so you aren't surprised when you get to your appointment.  2) Contact Your Local Health Department Not all health departments have doctors that can see patients for sick visits, but many do, so it is worth a call to see if  yours does. If you don't know where your local health department is, you can check in your phone book. The CDC also has a tool to help you locate your state's health department, and many state websites also have listings of all of their local health departments.  3) Find a Walk-in Clinic If your illness is not likely to be very severe or complicated, you may want to try a walk in clinic. These are popping up all over the country in pharmacies, drugstores, and shopping centers. They're usually staffed by nurse practitioners or physician assistants that have been trained to treat common illnesses and complaints. They're usually fairly quick and inexpensive. However, if you have serious medical issues or chronic medical problems, these are probably not your best option  STD Testing - [facility] Department of [facility] [place], STD Clinic, [address], [place], phone [phone] or [phone].  Monday - Friday, call for an appointment. [facility] Department of [employer], STD Clinic, [address], [place], phone [phone] or [phone].  Monday - Friday, call for an appointment.  Abuse/Neglect: [facility] Child Abuse Hotline (712)454-3728 [facility] Child Abuse Hotline 989 612 5260 (After Hours)  Emergency Shelter:  [facility] (717)696-0130  Maternity Homes: - [facility] 548-547-0166 - [facility] [phone]  MRSA Hotline #:   705-241-7011  [facility] Resources  [facility]  United Way [facility] Dept. [address]                 [address]         [address] [place] [address]  Blondell Reveal Phone:  [phone]                                  Phone:  [phone]                   Phone:  [phone]  [facility],  [phone] - [phone]       -     [facility] in [place], [address]                                  [phone], Insurance  [place] Child Abuse Hotline (313) 653-9395 or (432) 252-9997 (After Hours)   Behavioral Health Services  Substance Abuse Resources: - [facility] Drug [facility]  (279) 427-1966 - [facility] (859) [phone] - [facility] [place] [phone] Floydene Flock [phone] - Residential & Outpatient Substance Abuse Program  [phone]  Psychological Services: [facility]  ([phone] Services  [phone] - [facility], [phone] [place]. [address] [place], ACCESS LINE: 949-639-1126 or 347-086-6390, [employer]  Dental Assistance  If unable to pay or uninsured, contact:  [facility] or [facility]. to become qualified for the adult dental clinic.  Patients with Medicaid: [facility] [phone] [address], [phone] [address], [phone]  If unable to pay, or uninsured, contact [facility] [phone]) or [facility] Department [phone] in [place], [phone] in [facility]) to become qualified for the adult dental clinic  Other Low-Cost Community Dental Services: - [facility]- [address] [place], [place], [place], [address], [phone], [phone], 2nd and 4th Thursday of the month at 6:30am.  10 clients each day by appointment, can sometimes see walk-in patients if someone does not show for an appointment. [facility]- [address] Ether Griffins [place], [place], [address], [phone] - [facility]- [address], [place], [place], [address], [phone] - [facility]- [phone] [facility]- [phone] [facility] Department- [phone]
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
## 2011-08-14 ENCOUNTER — Emergency Department (HOSPITAL_COMMUNITY)
Admission: EM | Admit: 2011-08-14 | Discharge: 2011-08-14 | Disposition: A | Discharge status: Home / Self Care | Payer: Self-pay | Attending: Emergency Medicine | Admitting: Emergency Medicine

## 2011-08-14 ENCOUNTER — Encounter (HOSPITAL_COMMUNITY): Payer: Self-pay | Admitting: Family Medicine

## 2011-08-14 DIAGNOSIS — E860 Dehydration: Secondary | ICD-10-CM | POA: Insufficient documentation

## 2011-08-14 DIAGNOSIS — M1990 Unspecified osteoarthritis, unspecified site: Secondary | ICD-10-CM | POA: Insufficient documentation

## 2011-08-14 DIAGNOSIS — Z765 Malingerer [conscious simulation]: Secondary | ICD-10-CM | POA: Insufficient documentation

## 2011-08-14 DIAGNOSIS — F17200 Nicotine dependence, unspecified, uncomplicated: Secondary | ICD-10-CM | POA: Insufficient documentation

## 2011-08-14 DIAGNOSIS — R1110 Vomiting, unspecified: Secondary | ICD-10-CM | POA: Insufficient documentation

## 2011-08-14 DIAGNOSIS — M4120 Other idiopathic scoliosis, site unspecified: Secondary | ICD-10-CM | POA: Insufficient documentation

## 2011-08-14 DIAGNOSIS — R197 Diarrhea, unspecified: Secondary | ICD-10-CM | POA: Insufficient documentation

## 2011-08-14 DIAGNOSIS — F1910 Other psychoactive substance abuse, uncomplicated: Secondary | ICD-10-CM | POA: Insufficient documentation

## 2011-08-14 DIAGNOSIS — R109 Unspecified abdominal pain: Secondary | ICD-10-CM

## 2011-08-14 LAB — RAPID URINE DRUG SCREEN, HOSP PERFORMED
Barbiturates: POSITIVE — AB
Cocaine: NOT DETECTED

## 2011-08-14 LAB — URINALYSIS, ROUTINE W REFLEX MICROSCOPIC
Bilirubin Urine: NEGATIVE
Leukocytes, UA: NEGATIVE
Nitrite: NEGATIVE
Specific Gravity, Urine: 1.026 (ref 1.005–1.030)
Urobilinogen, UA: 0.2 mg/dL (ref 0.0–1.0)

## 2011-08-14 LAB — DIFFERENTIAL
Basophils Absolute: 0 10*3/uL (ref 0.0–0.1)
Basophils Relative: 0 % (ref 0–1)
Lymphocytes Relative: 32 % (ref 12–46)
Monocytes Absolute: 0.5 10*3/uL (ref 0.1–1.0)
Neutro Abs: 5.7 10*3/uL (ref 1.7–7.7)
Neutrophils Relative %: 62 % (ref 43–77)

## 2011-08-14 LAB — CBC
MCHC: 34.9 g/dL (ref 30.0–36.0)
Platelets: 228 10*3/uL (ref 150–400)
RDW: 12.7 % (ref 11.5–15.5)
WBC: 9.2 10*3/uL (ref 4.0–10.5)

## 2011-08-14 MED ORDER — SODIUM CHLORIDE 0.9 % IV BOLUS (SEPSIS)
2000.0000 mL | Freq: Once | INTRAVENOUS | Status: AC
  Administered 2011-08-14: 2000 mL via INTRAVENOUS

## 2011-08-14 MED ORDER — KETOROLAC TROMETHAMINE 30 MG/ML IJ SOLN
30.0000 mg | Freq: Once | INTRAMUSCULAR | Status: AC
  Administered 2011-08-14: 30 mg via INTRAVENOUS
  Filled 2011-08-14: qty 1

## 2011-08-14 MED ORDER — LORAZEPAM 2 MG/ML IJ SOLN
1.0000 mg | Freq: Once | INTRAMUSCULAR | Status: AC
  Administered 2011-08-14: 14:00:00 via INTRAVENOUS
  Filled 2011-08-14: qty 1

## 2011-08-14 MED ORDER — CYCLOBENZAPRINE HCL 10 MG PO TABS: 10.0000 mg | ORAL_TABLET | Freq: Three times a day (TID) | ORAL | Status: AC | PRN

## 2011-08-14 MED ORDER — DIPHENHYDRAMINE HCL 50 MG/ML IJ SOLN
25.0000 mg | Freq: Once | INTRAMUSCULAR | Status: AC
  Administered 2011-08-14: 14:00:00 via INTRAVENOUS
  Filled 2011-08-14: qty 1

## 2011-08-14 MED ORDER — PROMETHAZINE HCL 25 MG RE SUPP: 25.0000 mg | Freq: Four times a day (QID) | RECTAL | Status: DC | PRN

## 2011-08-14 MED ORDER — SODIUM CHLORIDE 0.9 % IV SOLN: INTRAVENOUS | Status: DC

## 2011-08-14 MED ORDER — METOCLOPRAMIDE HCL 5 MG/ML IJ SOLN
10.0000 mg | Freq: Once | INTRAMUSCULAR | Status: AC
  Administered 2011-08-14: 10 mg via INTRAVENOUS
  Filled 2011-08-14: qty 2

## 2011-08-14 NOTE — ED Notes (Signed)
Pt used call bell to alert nurse that she needed her IV taken out bc she had to leave. Instructed pt that as soon as her discharge papers were printed I be able to discharge her.

## 2011-08-14 NOTE — ED Notes (Signed)
Pt called nurse in to request more pain medication. Called Dr. Lynelle Doctor, EDP and made her aware of pt request. Dr. Lynelle Doctor stated that pt was going to be discharged and no more pain medication will be given. Pt notified of Dr. Delford Field order.

## 2011-08-14 NOTE — Discharge Instructions (Signed)
Drink plenty of fluids (clear liquids) the next 12-24 hours then start the BRAT diet. . Use the phenergan for nausea or vomiting. Take imodium OTC for diarrhea. Avoid mild products until the diarrhea is gone. Recheck if you get worse. Take the flexeril for your sore muscles.
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
## 2011-08-14 NOTE — ED Notes (Addendum)
Went in pt room and she was not there. IV cath and line lying on bed. Pt gown and armband on bedside table. Pt unable to get discharge instructions bc she wasn't in room.

## 2011-08-14 NOTE — ED Notes (Addendum)
Patient refuses blood draw, RN made aware.

## 2011-08-14 NOTE — ED Notes (Signed)
Pt. Refuses to let us scan her bladder until the nurse gives her something for pain. Nurse is notified.
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
## 2011-08-24 IMAGING — CR DG ABDOMEN ACUTE W/ 1V CHEST
5 series · 5 of 5 positions shown · non-contrast
Comparison: Chest and two views abdomen 01/07/2009 and CT abdomen
and pelvis 11/09/2009.

CLINICAL DATA: Nausea, vomiting and diarrhea.

ACUTE ABDOMEN SERIES (ABDOMEN 2 VIEW & CHEST 1 VIEW)

[w abdomen decub * (1 of 2)]
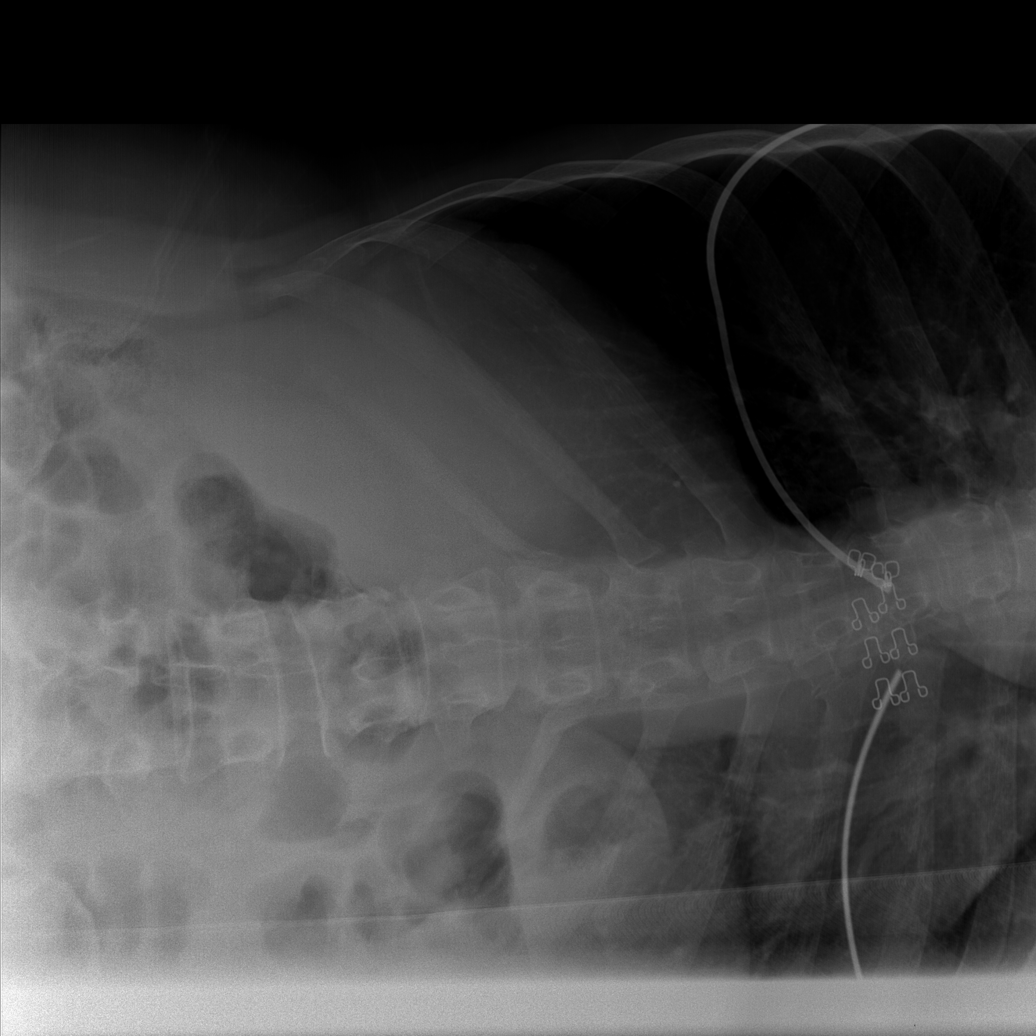

[w abdomen decub * (2 of 2)]
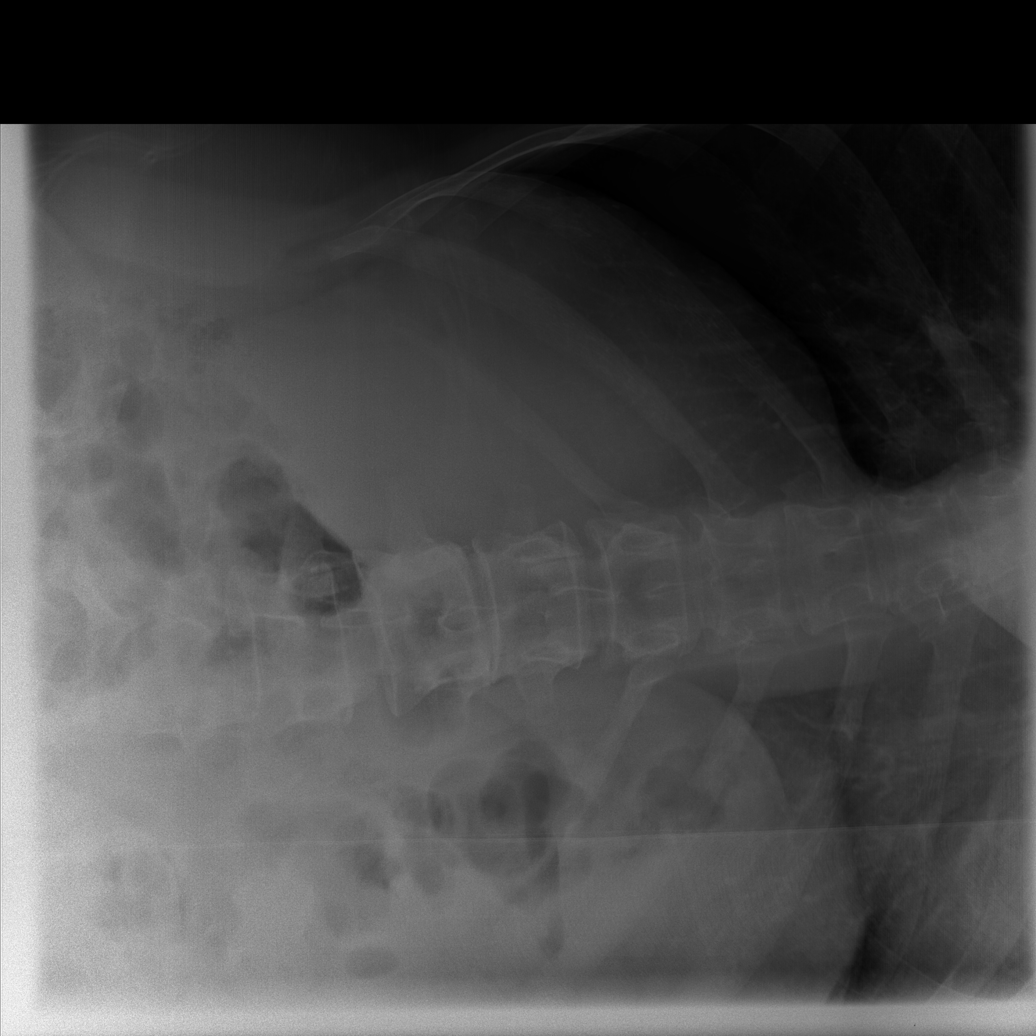

[view not recorded (1 of 3)]
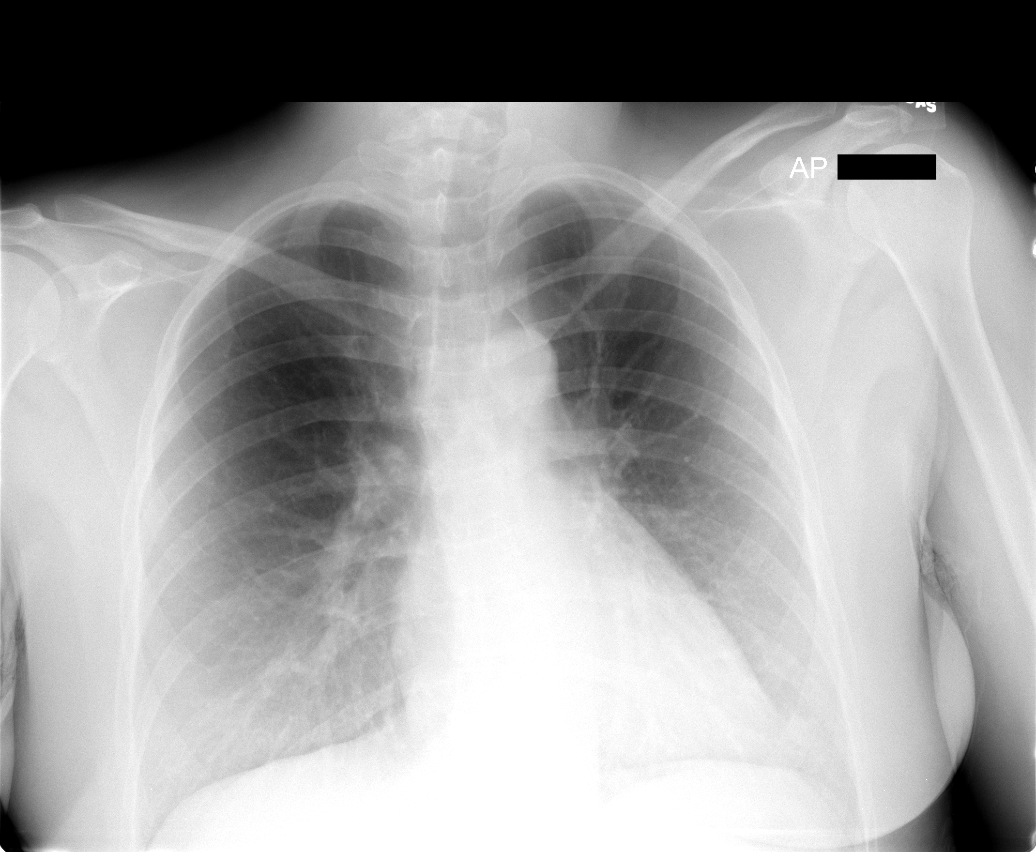

[view not recorded (2 of 3)]
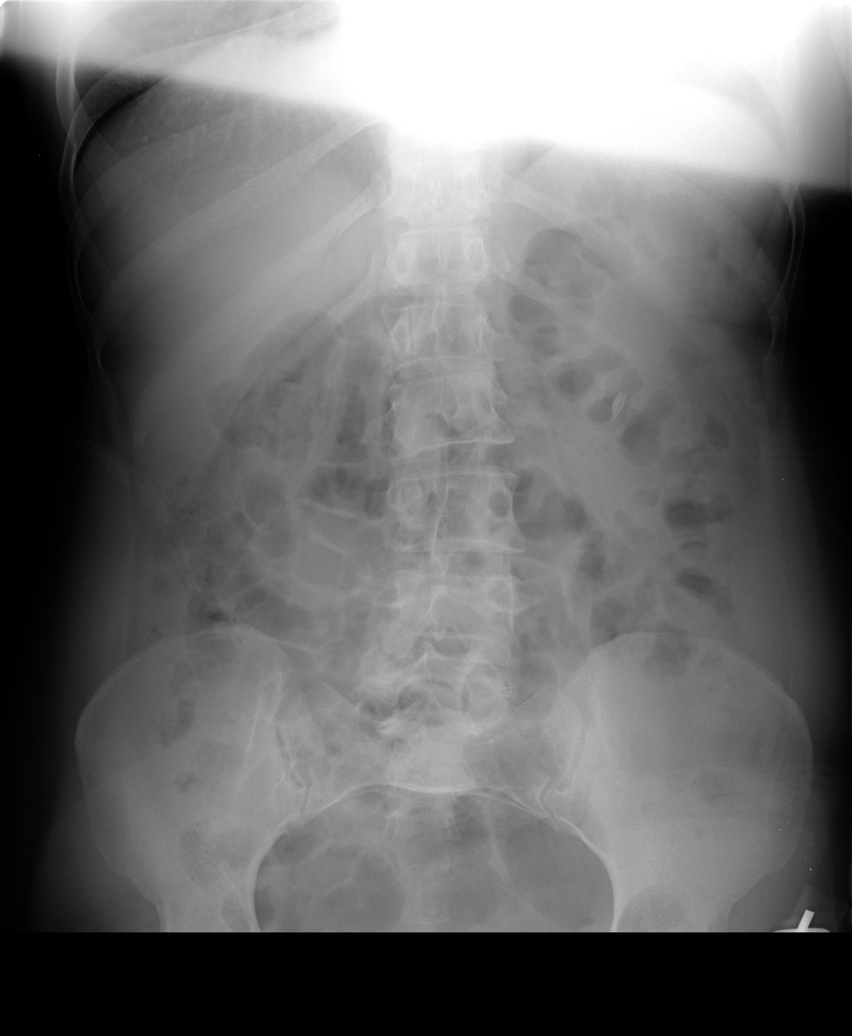

[view not recorded (3 of 3)]
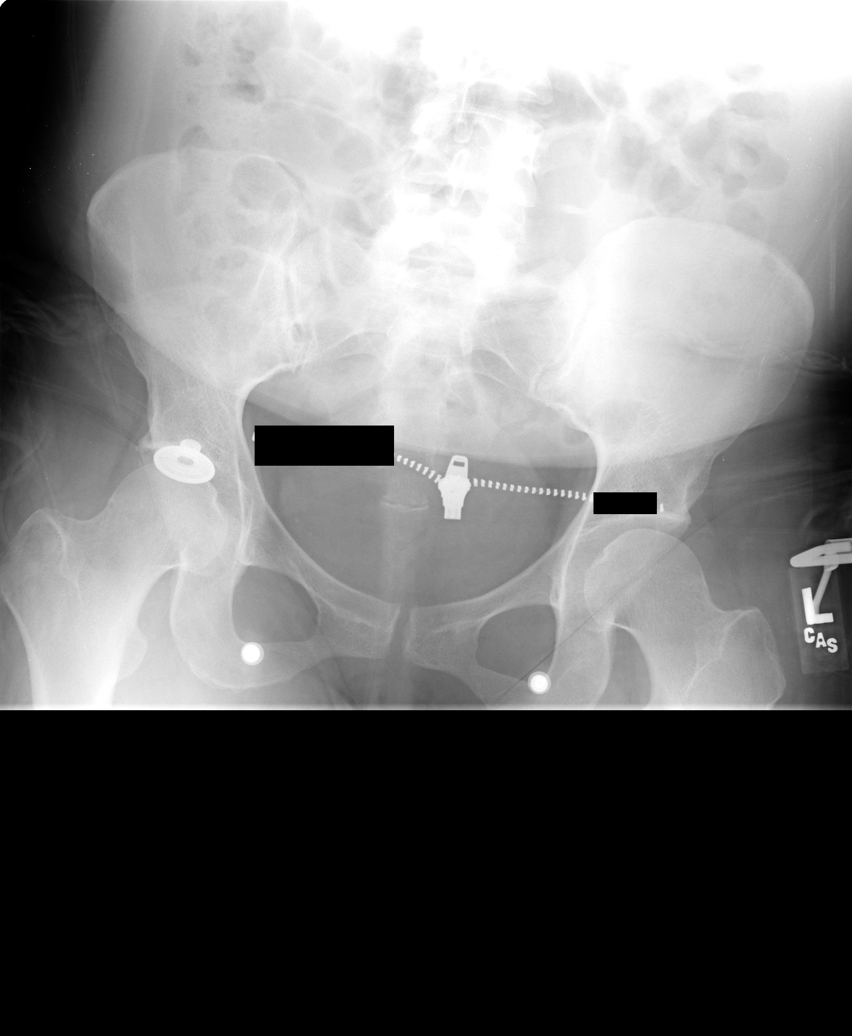

[5 of 5 positions shown; findings below may reference images not displayed]

FINDINGS: Single view of the chest demonstrates clear lungs and
normal heart size.  No pleural effusion.

Two views of the abdomen show a normal bowel gas pattern.  There is
no free intraperitoneal air.
IMPRESSION: Negative examination.

## 2011-08-25 ENCOUNTER — Emergency Department (HOSPITAL_COMMUNITY)
Admission: EM | Admit: 2011-08-25 | Discharge: 2011-08-25 | Disposition: A | Discharge status: Home / Self Care | Payer: Self-pay | Attending: Emergency Medicine | Admitting: Emergency Medicine

## 2011-08-25 ENCOUNTER — Encounter (HOSPITAL_COMMUNITY): Payer: Self-pay | Admitting: Emergency Medicine

## 2011-08-25 DIAGNOSIS — H5712 Ocular pain, left eye: Secondary | ICD-10-CM

## 2011-08-25 DIAGNOSIS — M1990 Unspecified osteoarthritis, unspecified site: Secondary | ICD-10-CM | POA: Insufficient documentation

## 2011-08-25 DIAGNOSIS — F17200 Nicotine dependence, unspecified, uncomplicated: Secondary | ICD-10-CM | POA: Insufficient documentation

## 2011-08-25 DIAGNOSIS — H109 Unspecified conjunctivitis: Secondary | ICD-10-CM | POA: Insufficient documentation

## 2011-08-25 DIAGNOSIS — Z859 Personal history of malignant neoplasm, unspecified: Secondary | ICD-10-CM | POA: Insufficient documentation

## 2011-08-25 DIAGNOSIS — H209 Unspecified iridocyclitis: Secondary | ICD-10-CM | POA: Insufficient documentation

## 2011-08-25 DIAGNOSIS — M4120 Other idiopathic scoliosis, site unspecified: Secondary | ICD-10-CM | POA: Insufficient documentation

## 2011-08-25 HISTORY — DX: Malignant (primary) neoplasm, unspecified: C80.1

## 2011-08-25 MED ORDER — TETRACAINE HCL 0.5 % OP SOLN
2.0000 [drp] | Freq: Once | OPHTHALMIC | Status: AC
  Administered 2011-08-25: 2 [drp] via OPHTHALMIC
  Filled 2011-08-25: qty 2

## 2011-08-25 MED ORDER — CYCLOPENTOLATE HCL 1 % OP SOLN
1.0000 [drp] | Freq: Once | OPHTHALMIC | Status: AC
  Administered 2011-08-25: 1 [drp] via OPHTHALMIC
  Filled 2011-08-25: qty 2

## 2011-08-25 MED ORDER — FLUORESCEIN SODIUM 1 MG OP STRP
1.0000 | ORAL_STRIP | Freq: Once | OPHTHALMIC | Status: AC
  Administered 2011-08-25: 1 via OPHTHALMIC

## 2011-08-25 MED ORDER — ERYTHROMYCIN 5 MG/GM OP OINT
TOPICAL_OINTMENT | Freq: Once | OPHTHALMIC | Status: AC
  Administered 2011-08-25: 05:00:00 via OPHTHALMIC
  Filled 2011-08-25: qty 3.5

## 2011-08-25 NOTE — ED Notes (Signed)
Erythromycin Opthalmic Ointment and Cyclopentolate Eyedrops sent home with pt per MD request.

## 2011-08-25 NOTE — ED Notes (Signed)
MD at bedside. 

## 2011-08-25 NOTE — ED Notes (Signed)
Woke up with minor left eye pain. Then around 3pm it worsened with light sensitivity and yellow drainage and HA. Pt has Hx of shingles in both eyes. Pt denies any injury or trauma. Pt denies fever, N/V.

## 2011-08-25 NOTE — Discharge Instructions (Signed)
Use erythromycin ointment 3-4 times a day in both eyes.  Take Ibuprofen, 800 mg, three times a day to help with pain and inflammation.  Please contact Dr Vonna Kotyk' office this morning to arrange to be seen this afternoon.  They will work with you as far as finances go, and will not refuse to see you today.     Conjunctivitis Conjunctivitis is commonly called "pink eye." Conjunctivitis can be caused by bacterial or viral infection, allergies, or injuries. There is usually redness of the lining of the eye, itching, discomfort, and sometimes discharge. There may be deposits of matter along the eyelids. A viral infection usually causes a watery discharge, while a bacterial infection causes a yellowish, thick discharge. Pink eye is very contagious and spreads by direct contact. You may be given antibiotic eyedrops as part of your treatment. Before using your eye medicine, remove all drainage from the eye by washing gently with warm water and cotton balls. Continue to use the medication until you have awakened 2 mornings in a row without discharge from the eye. Do not rub your eye. This increases the irritation and helps spread infection. Use separate towels from other household members. Wash your hands with soap and water before and after touching your eyes. Use cold compresses to reduce pain and sunglasses to relieve irritation from light. Do not wear contact lenses or wear eye makeup until the infection is gone. SEEK MEDICAL CARE IF:   Your symptoms are not better after 3 days of treatment.   You have increased pain or trouble seeing.   The outer eyelids become very red or swollen.  Document Released: 04/03/2004 Document Revised: 02/13/2011 Document Reviewed: 02/24/2005 [facility] Patient Information 2012 [place], [place]. Iritis Iritis is an inflammation of the colored part of the eye (iris). Other parts at the front of the eye may also be inflamed. The iris is part of the middle layer of the eyeball which  is called the uvea or the uveal track. Any part of the uveal track can become inflamed. The other portions of the uveal track are the choroid (the thin membrane under the outer layer of the eye), and the ciliary body (joins the choroid and the iris and produces the fluid in the front of the eye).  It is extremely important to treat iritis early, as it may lead to internal eye damage causing scarring or diseases such as glaucoma. Some people have only one attack of iritis (in one or both eyes) in their lifetime, while others may get it many times. CAUSES Iritis can be associated with many different diseases, but mostly occurs in otherwise healthy people. Examples of diseases that can be associated with iritis include:  Diseases where the body's immune system attacks tissues within your own body (autoimmune diseases).   Infections (tuberculosis, gonorrhea, fungus infections, Lyme disease, infection of the lining of the heart).   Trauma or injury.   Eye diseases (acute glaucoma and others).   Inflammation from other parts of the uveal track.   Severe eye infections.   Other rare diseases.  SYMPTOMS  Eye pain or aching.   Sensitivity to light.   Loss of sight or blurred vision.   Redness of the eye. This is often accompanied by a ring of redness around the outside of the cornea, or clear covering at the front of the eye (ciliary flush).   Excessive tearing of the eye(s).   A small pupil that does not enlarge in the dark and stays smaller  than the other eye's pupil.   A whitish area that obscures the lower part of the colored circular iris. Sometimes this is visible when looking at the eye, where the whitish area has a "fluid level" or flat top. This is called a "hypopyon" and is actually pus inside the eye.  Since iritis causes the eye to become red, it is often confused with a much less dangerous form of "pink eye" or conjunctivitis. One of the most important symptoms is sensitivity to  light. Anytime there is redness, discomfort in the eye(s) and extreme light sensitivity, it is extremely important to see an ophthalmologist as soon as possible. TREATMENT Acute iritis requires prompt medical evaluation by an eye specialist (ophthalmologist.) Treatment depends on the underlying cause but may include:  Corticosteroid eye drops and dilating eye drops. Follow your caregiver's exact instructions on taking and stopping corticosteroid medications (drops or pills).   Occasionally, the iritis will be so severe that it will not respond to commonly used medications. If this happens, it may be necessary to use steroid injections. The injections are given under the eye's outer surface. Sometimes oral medications are given. The decision on treatment used for iritis is usually made on an individual basis.  HOME CARE INSTRUCTIONS Your care giver will give specific instructions regarding the use of eye medications or other medications. Be certain to follow all instructions in both taking and stopping the medications. SEEK IMMEDIATE MEDICAL CARE IF:  You have redness of one or both eye.   You experience a great deal of light sensitivity.   You have pain or aching in either eye.  MAKE SURE YOU:   Understand these instructions.   Will watch your condition.   Will get help right away if you are not doing well or get worse.  Document Released: 02/24/2005 Document Revised: 02/13/2011 Document Reviewed: 08/14/2006 [facility] Patient Information 2012 [place], [place].

## 2011-08-27 NOTE — ED Provider Notes (Signed)
History     CSN: [id number]  Arrival date & time 08/25/11  0127   First MD Initiated Contact with Patient 08/25/11 0310      Chief Complaint  Patient presents with  . Eye Pain    (Consider location/radiation/quality/duration/timing/severity/associated sxs/prior treatment) HPI 41 yo female presents to the ER with complaint of left eye pain.  Pt reports pain initially mild, but has worsened through the day/evening.  She has been unable to sleep due to pain.  She reports pain is similar to opthalmic shingles she had in the past.  No rash or lesions to the face currently.  No trauma to the eye.  She reports drainage and photophobia.  No fevers.  No use of contacts.  Pt reports some mild sxs to right eye as well.  Pt was seen many years ago by a local eye doctor but does not remember the name.  Past Medical History  Diagnosis Date  . DJD (degenerative joint disease)   . Kidney stones   . Cancer   . Scoliosis     Past Surgical History  Procedure Date  . Abdominal hysterectomy     No family history on file.  History  Substance Use Topics  . Smoking status: Current Everyday Smoker -- 0.5 packs/day    Types: Cigarettes  . Smokeless tobacco: Never Used  . Alcohol Use: No     seldom    OB History    Grav Para Term Preterm Abortions TAB SAB Ect Mult Living                  Review of Systems  All other systems reviewed and are negative.    Allergies  Morphine and related; Penicillins; and Zofran  Home Medications   Current Outpatient Rx  Name Route Sig Dispense Refill  . ESTROGENS CONJUGATED 0.45 MG PO TABS Oral Take 0.45 mg by mouth daily.     . IBUPROFEN 200 MG PO TABS Oral Take 400 mg by mouth every 8 (eight) hours as needed. For pain.      BP 110/84  Pulse 99  Temp 97.9 F (36.6 C) (Oral)  Resp 18  Ht 5\' 4"  (1.626 m)  Wt 150 lb (68.04 kg)  BMI 25.75 kg/m2  SpO2 100%  LMP 03/10/2005  Physical Exam  Nursing note and vitals reviewed. Constitutional:  She appears well-developed and well-nourished. She appears distressed (agitated, in pain).  HENT:  Head: Normocephalic and atraumatic.  Right Ear: External ear normal.  Left Ear: External ear normal.  Nose: Nose normal.  Mouth/Throat: No oropharyngeal exudate.  Eyes: EOM are normal. Pupils are equal, round, and reactive to light. No foreign bodies found. Right eye exhibits no chemosis, no discharge and no exudate. No foreign body present in the right eye. Left eye exhibits discharge and exudate. Left eye exhibits no chemosis and no hordeolum. No foreign body present in the left eye. Right conjunctiva is not injected. Right conjunctiva has no hemorrhage. Left conjunctiva is injected. Left conjunctiva has no hemorrhage. No scleral icterus.  Slit lamp exam:      The right eye shows no corneal abrasion, no corneal flare, no corneal ulcer, no foreign body, no hyphema, no hypopyon and no fluorescein uptake.       The left eye shows fluorescein uptake (diffuse without noted abrasions or dendrites). The left eye shows no corneal abrasion, no corneal flare, no corneal ulcer, no foreign body, no hyphema and no hypopyon.       +  photophobia in left eye both direct and consensual.  Slight injection around limbus, tearing.    Cardiovascular: Regular rhythm.   Skin: Skin is warm and dry. No rash noted. No erythema. No pallor.    ED Course  Procedures (including critical care time)  Labs Reviewed - No data to display No results found.   1. Left eye pain   2. Iridocyclitis of left eye   3. Conjunctivitis of left eye       MDM  41 yo with left eye pain, drainage.  No signs of herpetic infection at this time, but unclear if this is early presentation.  Exam does not show cell or flare, but sxs seem c/w irditis, but no trauma.  Some drainage noted, possible infection.  Pt reports she does not have funding to get medications or follow up.  D/w Dr Vonna Kotyk on call for ophtho.  He will see her in the office  later today for thorough exam and further workup.  Plan relayed to patient.  She is upset not to receive opiates, but was reassure that with the medications and nsaid, pain should improve.  It was stressed to her the importance of follow up later today with opthalmology.         Olivia Mackie, MD 08/27/11 1154

## 2011-10-02 ENCOUNTER — Encounter (HOSPITAL_COMMUNITY): Payer: Self-pay | Admitting: *Deleted

## 2011-10-02 ENCOUNTER — Emergency Department (HOSPITAL_COMMUNITY)
Admission: EM | Admit: 2011-10-02 | Discharge: 2011-10-02 | Disposition: A | Discharge status: Home / Self Care | Payer: Self-pay | Attending: Emergency Medicine | Admitting: Emergency Medicine

## 2011-10-02 ENCOUNTER — Emergency Department (HOSPITAL_COMMUNITY): Payer: Self-pay

## 2011-10-02 DIAGNOSIS — Z8543 Personal history of malignant neoplasm of ovary: Secondary | ICD-10-CM | POA: Insufficient documentation

## 2011-10-02 DIAGNOSIS — F17200 Nicotine dependence, unspecified, uncomplicated: Secondary | ICD-10-CM | POA: Insufficient documentation

## 2011-10-02 DIAGNOSIS — R112 Nausea with vomiting, unspecified: Secondary | ICD-10-CM | POA: Insufficient documentation

## 2011-10-02 DIAGNOSIS — R109 Unspecified abdominal pain: Secondary | ICD-10-CM | POA: Insufficient documentation

## 2011-10-02 HISTORY — DX: Anxiety disorder, unspecified: F41.9

## 2011-10-02 LAB — COMPREHENSIVE METABOLIC PANEL
ALT: 11 U/L (ref 0–35)
AST: 16 U/L (ref 0–37)
Alkaline Phosphatase: 57 U/L (ref 39–117)
CO2: 29 mEq/L (ref 19–32)
Chloride: 102 mEq/L (ref 96–112)
GFR calc Af Amer: >90 mL/min (ref >90)
GFR calc non Af Amer: >90 mL/min (ref >90)
Glucose, Bld: 89 mg/dL (ref 70–99)
Sodium: 141 mEq/L (ref 135–145)
Total Bilirubin: 0.2 mg/dL — ABNORMAL LOW (ref 0.3–1.2)

## 2011-10-02 LAB — URINALYSIS, ROUTINE W REFLEX MICROSCOPIC
Bilirubin Urine: NEGATIVE
Glucose, UA: NEGATIVE mg/dL
Hgb urine dipstick: NEGATIVE
Ketones, ur: NEGATIVE mg/dL
Protein, ur: NEGATIVE mg/dL
Urobilinogen, UA: 0.2 mg/dL (ref 0.0–1.0)

## 2011-10-02 LAB — CBC
HCT: 44.8 % (ref 36.0–46.0)
MCV: 92 fL (ref 78.0–100.0)
Platelets: 267 10*3/uL (ref 150–400)
RBC: 4.87 MIL/uL (ref 3.87–5.11)
RDW: 12.3 % (ref 11.5–15.5)
WBC: 10.7 10*3/uL — ABNORMAL HIGH (ref 4.0–10.5)

## 2011-10-02 LAB — DIFFERENTIAL
Basophils Absolute: 0 10*3/uL (ref 0.0–0.1)
Eosinophils Relative: 4 % (ref 0–5)
Lymphocytes Relative: 29 % (ref 12–46)
Lymphs Abs: 3.1 10*3/uL (ref 0.7–4.0)
Neutro Abs: 6.7 10*3/uL (ref 1.7–7.7)

## 2011-10-02 MED ORDER — IOHEXOL 300 MG/ML  SOLN
20.0000 mL | INTRAMUSCULAR | Status: AC
  Administered 2011-10-02 (×2): 20 mL via ORAL

## 2011-10-02 MED ORDER — HYDROMORPHONE HCL PF 1 MG/ML IJ SOLN
1.0000 mg | Freq: Once | INTRAMUSCULAR | Status: AC
  Administered 2011-10-02: 1 mg via INTRAVENOUS
  Filled 2011-10-02: qty 1

## 2011-10-02 MED ORDER — OXYCODONE-ACETAMINOPHEN 5-325 MG PO TABS: 1.0000 | ORAL_TABLET | ORAL | Status: AC | PRN

## 2011-10-02 MED ORDER — SODIUM CHLORIDE 0.9 % IV SOLN
1000.0000 mL | Freq: Once | INTRAVENOUS | Status: AC
  Administered 2011-10-02: 1000 mL via INTRAVENOUS

## 2011-10-02 MED ORDER — METOCLOPRAMIDE HCL 5 MG/ML IJ SOLN
10.0000 mg | Freq: Once | INTRAMUSCULAR | Status: AC
  Administered 2011-10-02: 10 mg via INTRAVENOUS
  Filled 2011-10-02: qty 2

## 2011-10-02 MED ORDER — PROMETHAZINE HCL 25 MG PO TABS: 25.0000 mg | ORAL_TABLET | Freq: Four times a day (QID) | ORAL | Status: AC | PRN

## 2011-10-02 MED ORDER — IOHEXOL 300 MG/ML  SOLN
80.0000 mL | Freq: Once | INTRAMUSCULAR | Status: AC | PRN
  Administered 2011-10-02: 80 mL via INTRAVENOUS

## 2011-10-02 NOTE — ED Notes (Signed)
Pt awoke with acute onset suprapubic/inguinal pain and emesis.  Pt also c/o difficulty urinating since 0200 this am.

## 2011-10-02 NOTE — ED Provider Notes (Signed)
Medical screening examination/treatment/procedure(s) were performed by non-physician practitioner and as supervising physician I was immediately available for consultation/collaboration.   Glynn Octave, MD 10/02/11 1630

## 2011-10-02 NOTE — ED Provider Notes (Signed)
History     CSN: [id number]  Arrival date & time 10/02/11  1324   First MD Initiated Contact with Patient 10/02/11 [phone]      Chief Complaint  Patient presents with  . Abdominal Pain    (Consider location/radiation/quality/duration/timing/severity/associated sxs/prior treatment) The history is provided by the patient.    41 year old female in no acute distress complaining of 7x bilious emesis with bilateral lower abdominal pain with acute onset at  3:30 AM. Pain is 8/10, nonradiating, cannot identify any exacerbating or relieving factors. Patient denies diarrhea, fever. Patient is passing gas. No recent travel or sick contacts. 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 Bilateral oophorectomy and hysterectomy.    Past Medical History  Diagnosis Date  . DJD (degenerative joint disease)   . Scoliosis   . Cancer     ovarian  . Kidney stones   . Anxiety     Past Surgical History  Procedure Date  . Abdominal hysterectomy     No family history on file.  History  Substance Use Topics  . Smoking status: Current Everyday Smoker -- 0.5 packs/day    Types: Cigarettes  . Smokeless tobacco: Never Used  . Alcohol Use: No     seldom    OB History    Grav Para Term Preterm Abortions TAB SAB Ect Mult Living                  Review of Systems  Constitutional: Negative for fever.  HENT: Negative for sore throat.   Respiratory: Negative for shortness of breath.   Cardiovascular: Negative for chest pain and palpitations.  Gastrointestinal: Positive for nausea, vomiting and abdominal pain. Negative for diarrhea, constipation and blood in stool.  Genitourinary: Negative for dysuria and vaginal discharge.  Musculoskeletal: Negative for back pain.  Skin: Negative for rash.  Neurological: Negative for weakness.  All other systems reviewed and are negative.    Allergies  Morphine and related; Penicillins; and Zofran  Home Medications   Current Outpatient Rx  Name Route Sig Dispense  Refill  . ALPRAZOLAM 0.5 MG PO TABS Oral Take 0.5 mg by mouth 3 (three) times daily.    Marland Kitchen ESTROGENS CONJUGATED 0.45 MG PO TABS Oral Take 0.45 mg by mouth daily.       BP 113/79  Pulse 86  Temp 97.6 F (36.4 C) (Oral)  Resp 100  SpO2 100%  LMP 03/10/2005  Physical Exam  Vitals reviewed. Constitutional: She is oriented to person, place, and time. She appears well-developed and well-nourished.       Appears to be in pain  HENT:  Head: Normocephalic and atraumatic.  Eyes: Conjunctivae and EOM are normal. Pupils are equal, round, and reactive to light.  Neck: Normal range of motion. Neck supple.  Cardiovascular: Normal rate, regular rhythm, normal heart sounds and intact distal pulses.   Pulmonary/Chest: Effort normal.  Abdominal: Soft. Bowel sounds are normal. She exhibits no distension and no mass. There is tenderness. There is no rebound and no guarding.       Tender to light palpation LLQ. Rovsing and psoas negative. No rebound tenderness.  Musculoskeletal: Normal range of motion.  Neurological: She is alert and oriented to person, place, and time.  Skin: Skin is warm and dry.  Psychiatric: She has a normal mood and affect.    ED Course  Procedures (including critical care time)  Labs Reviewed  CBC - Abnormal; Notable for the following:    WBC 10.7 (*)     Hemoglobin  15.4 (*)     All other components within normal limits  COMPREHENSIVE METABOLIC PANEL - Abnormal; Notable for the following:    Total Bilirubin 0.2 (*)     All other components within normal limits  DIFFERENTIAL  LIPASE, BLOOD  URINALYSIS, ROUTINE W REFLEX MICROSCOPIC   Ct Abdomen Pelvis W Contrast  10/02/2011  *RADIOLOGY REPORT*  Clinical Data: Nausea, vomiting.  CT ABDOMEN AND PELVIS WITH CONTRAST  Technique:  Multidetector CT imaging of the abdomen and pelvis was performed following the standard protocol during bolus administration of intravenous contrast.  Contrast: 80mL OMNIPAQUE IOHEXOL 300 MG/ML  SOLN   Comparison: 08/09/2011.  Findings: Mild dependent atelectasis at the lung bases.  Liver appears within normal limits.  Gallbladder normal.  No calcified gallstones.  Spleen within normal limits.  Adrenal glands normal. Renal enhancement is normal.  Pancreas and common bile duct appear normal.  There is mild thickening of the distal esophagus which could be associated gastroesophageal reflux.  Small bowel is within normal limits without evidence of obstruction.  No adenopathy. Normal appendix.  Proximal colon is distended with stool.  Distal colon appears within normal limits.  No free fluid.  Hysterectomy. Urinary bladder appears normal.  No aggressive osseous lesions. 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  IMPRESSION: 1.  No acute abnormality. 2.  Hysterectomy.  Original Report Authenticated By: Andreas Newport, M.D.     1. Abdominal pain       MDM  41 year old female complaining of bilateral lower abdominal pain of acute onset starting at 2 AM today. Left quadrant abdominal pain. White blood cell count slightly elevated at 10.7. CT abdomen and pelvis shows no abnormalities also urinalysis and other blood work within normal limits. Serial abdominal exams show no worsening of pain. Will DC with pain control and outpatient followup.  Pt verbalized understanding and agrees with care plan. Outpatient follow-up and return precautions given.    Note that Pt has not vomited since being in the ED and that she is tolerating PO based on ability to keep down PO contrast.       Wynetta Emery, PA-C 10/02/11 1134  Hermine Feria, PA-C 10/02/11 1140

## 2011-10-15 ENCOUNTER — Encounter (HOSPITAL_COMMUNITY): Payer: Self-pay | Admitting: Emergency Medicine

## 2011-10-15 ENCOUNTER — Emergency Department (HOSPITAL_COMMUNITY)
Admission: EM | Admit: 2011-10-15 | Discharge: 2011-10-15 | Disposition: A | Discharge status: Home / Self Care | Payer: Self-pay | Attending: Emergency Medicine | Admitting: Emergency Medicine

## 2011-10-15 DIAGNOSIS — M4120 Other idiopathic scoliosis, site unspecified: Secondary | ICD-10-CM | POA: Insufficient documentation

## 2011-10-15 DIAGNOSIS — F17200 Nicotine dependence, unspecified, uncomplicated: Secondary | ICD-10-CM | POA: Insufficient documentation

## 2011-10-15 DIAGNOSIS — M549 Dorsalgia, unspecified: Secondary | ICD-10-CM | POA: Insufficient documentation

## 2011-10-15 DIAGNOSIS — G8929 Other chronic pain: Secondary | ICD-10-CM | POA: Insufficient documentation

## 2011-10-15 DIAGNOSIS — M1990 Unspecified osteoarthritis, unspecified site: Secondary | ICD-10-CM | POA: Insufficient documentation

## 2011-10-15 DIAGNOSIS — Z8543 Personal history of malignant neoplasm of ovary: Secondary | ICD-10-CM | POA: Insufficient documentation

## 2011-10-15 MED ORDER — METOCLOPRAMIDE HCL 5 MG/ML IJ SOLN: 10.0000 mg | Freq: Once | INTRAMUSCULAR | Status: DC

## 2011-10-15 MED ORDER — HYDROCODONE-ACETAMINOPHEN 5-325 MG PO TABS: 1.0000 | ORAL_TABLET | Freq: Three times a day (TID) | ORAL | Status: DC | PRN

## 2011-10-15 MED ORDER — PREDNISONE 20 MG PO TABS: ORAL_TABLET | ORAL | Status: DC

## 2011-10-15 MED ORDER — METOCLOPRAMIDE HCL 5 MG/ML IJ SOLN
10.0000 mg | Freq: Once | INTRAMUSCULAR | Status: AC
  Administered 2011-10-15: 10 mg via INTRAMUSCULAR
  Filled 2011-10-15: qty 2

## 2011-10-15 MED ORDER — PREDNISONE 20 MG PO TABS
60.0000 mg | ORAL_TABLET | Freq: Once | ORAL | Status: AC
  Administered 2011-10-15: 60 mg via ORAL
  Filled 2011-10-15: qty 3

## 2011-10-15 MED ORDER — HYDROCODONE-ACETAMINOPHEN 5-325 MG PO TABS
1.0000 | ORAL_TABLET | Freq: Once | ORAL | Status: AC
  Administered 2011-10-15: 1 via ORAL
  Filled 2011-10-15: qty 1

## 2011-10-15 MED ORDER — LIDOCAINE 5 % EX PTCH
1.0000 | MEDICATED_PATCH | CUTANEOUS | Status: DC
  Administered 2011-10-15: 1 via TRANSDERMAL
  Filled 2011-10-15: qty 1

## 2011-10-15 NOTE — ED Provider Notes (Signed)
History     CSN: [id number]  Arrival date & time 10/15/11  1909   None     Chief Complaint  Patient presents with  . Back Pain    (Consider location/radiation/quality/duration/timing/severity/associated sxs/prior treatment) HPI Comments: Patient with a history of chronic back pain.  Was followed by Dr. Eulah Pont  until 3 years ago, when she lost her insurance, she has been having intermittent episodes of low back pain.  That she normally can get under control with over-the-counter ibuprofen, but for the past 3, days.  The pain has been worse.  She denies any injury or fall, incontinence.  She also states, the pain has gotten so bad that is causing her to become nauseated  The history is provided by the patient.    Past Medical History  Diagnosis Date  . DJD (degenerative joint disease)   . Scoliosis   . Cancer     ovarian  . Kidney stones   . Anxiety     Past Surgical History  Procedure Date  . Abdominal hysterectomy     No family history on file.  History  Substance Use Topics  . Smoking status: Current Everyday Smoker -- 0.5 packs/day    Types: Cigarettes  . Smokeless tobacco: Never Used  . Alcohol Use: No     seldom    OB History    Grav Para Term Preterm Abortions TAB SAB Ect Mult Living                  Review of Systems  Constitutional: Negative for fever and chills.  Respiratory: Negative for shortness of breath.   Gastrointestinal: Positive for nausea.  Genitourinary: Negative for dysuria and hematuria.  Musculoskeletal: Positive for back pain. Negative for gait problem.  Skin: Negative for rash and wound.  Neurological: Negative for dizziness, weakness and numbness.    Allergies  Morphine and related; Penicillins; and Zofran  Home Medications   Current Outpatient Rx  Name Route Sig Dispense Refill  . ACETAMINOPHEN 500 MG PO TABS Oral Take 1,000 mg by mouth every 6 (six) hours as needed. For back pain    . ALPRAZOLAM 0.5 MG PO TABS Oral Take  0.5 mg by mouth 3 (three) times daily.    Marland Kitchen ESTROGENS CONJUGATED 0.45 MG PO TABS Oral Take 0.45 mg by mouth daily.     . IBUPROFEN 200 MG PO TABS Oral Take 400 mg by mouth every 6 (six) hours as needed. For pain    . ADULT MULTIVITAMIN W/MINERALS CH Oral Take 1 tablet by mouth daily.    Marland Kitchen HYDROCODONE-ACETAMINOPHEN 5-325 MG PO TABS Oral Take 1 tablet by mouth every 8 (eight) hours as needed for pain. 15 tablet 0  . PREDNISONE 20 MG PO TABS  3 Tabs PO Days 1-3, then 2 tabs PO Days 4-6, then 1 tab PO Day 7-9, then Half Tab PO Day 10-12 20 tablet 0    BP 108/73  Pulse 92  Temp 98.7 F (37.1 C) (Oral)  Resp 18  SpO2 99%  LMP 03/10/2005  Physical Exam  Constitutional: She appears well-developed and well-nourished.  HENT:  Head: Normocephalic.  Eyes: Pupils are equal, round, and reactive to light.  Neck: Normal range of motion.  Cardiovascular: Normal rate.   Abdominal: Soft. She exhibits no distension. There is no tenderness.  Musculoskeletal: Normal range of motion. She exhibits tenderness.       Arms: Skin: Skin is warm. No rash noted. No erythema.    ED  Course  Procedures (including critical care time)  Labs Reviewed - No data to display No results found.   1. Exacerbation of chronic back pain       MDM  I will treat this patient with a long, 12 day steroid taper, lidocaine patch in the emergency department.  I will give her a shot of Reglan for her nausea.  As I believe this is related to pain, as well as pain control        Arman Filter, NP 10/15/11 2150  Arman Filter, NP 10/15/11 2150

## 2011-10-15 NOTE — ED Notes (Signed)
Pt c/o chronic back pain st's has gotton worse over past 3 days.  Unable to sleep at night.

## 2011-10-15 NOTE — ED Provider Notes (Signed)
Medical screening examination/treatment/procedure(s) were performed by non-physician practitioner and as supervising physician I was immediately available for consultation/collaboration. Devoria Albe, MD, Armando Gang   Ward Givens, MD 10/15/11 2312

## 2011-10-18 ENCOUNTER — Encounter (HOSPITAL_COMMUNITY): Payer: Self-pay | Admitting: *Deleted

## 2011-10-18 ENCOUNTER — Emergency Department (HOSPITAL_COMMUNITY)
Admission: EM | Admit: 2011-10-18 | Discharge: 2011-10-18 | Disposition: A | Discharge status: Home / Self Care | Payer: Self-pay | Attending: Emergency Medicine | Admitting: Emergency Medicine

## 2011-10-18 DIAGNOSIS — Z90710 Acquired absence of both cervix and uterus: Secondary | ICD-10-CM | POA: Insufficient documentation

## 2011-10-18 DIAGNOSIS — R1031 Right lower quadrant pain: Secondary | ICD-10-CM | POA: Insufficient documentation

## 2011-10-18 DIAGNOSIS — Z880 Allergy status to penicillin: Secondary | ICD-10-CM | POA: Insufficient documentation

## 2011-10-18 DIAGNOSIS — Z87442 Personal history of urinary calculi: Secondary | ICD-10-CM | POA: Insufficient documentation

## 2011-10-18 DIAGNOSIS — F411 Generalized anxiety disorder: Secondary | ICD-10-CM | POA: Insufficient documentation

## 2011-10-18 DIAGNOSIS — M4120 Other idiopathic scoliosis, site unspecified: Secondary | ICD-10-CM | POA: Insufficient documentation

## 2011-10-18 DIAGNOSIS — F17200 Nicotine dependence, unspecified, uncomplicated: Secondary | ICD-10-CM | POA: Insufficient documentation

## 2011-10-18 DIAGNOSIS — M1990 Unspecified osteoarthritis, unspecified site: Secondary | ICD-10-CM | POA: Insufficient documentation

## 2011-10-18 DIAGNOSIS — Z8543 Personal history of malignant neoplasm of ovary: Secondary | ICD-10-CM | POA: Insufficient documentation

## 2011-10-18 DIAGNOSIS — G8929 Other chronic pain: Secondary | ICD-10-CM | POA: Insufficient documentation

## 2011-10-18 LAB — URINALYSIS, ROUTINE W REFLEX MICROSCOPIC
Glucose, UA: NEGATIVE mg/dL
Hgb urine dipstick: NEGATIVE
Specific Gravity, Urine: 1.022 (ref 1.005–1.030)
pH: 7 (ref 5.0–8.0)

## 2011-10-18 LAB — POCT I-STAT, CHEM 8
BUN: 15 mg/dL (ref 6–23)
Calcium, Ion: 1.22 mmol/L (ref 1.12–1.23)
Chloride: 105 mEq/L (ref 96–112)
Creatinine, Ser: 0.9 mg/dL (ref 0.50–1.10)
TCO2: 25 mmol/L (ref 0–100)

## 2011-10-18 LAB — CBC WITH DIFFERENTIAL/PLATELET
Basophils Absolute: 0 10*3/uL (ref 0.0–0.1)
Eosinophils Relative: 0 % (ref 0–5)
HCT: 39.3 % (ref 36.0–46.0)
Hemoglobin: 13.1 g/dL (ref 12.0–15.0)
Lymphocytes Relative: 24 % (ref 12–46)
Lymphs Abs: 2.3 10*3/uL (ref 0.7–4.0)
MCV: 93.8 fL (ref 78.0–100.0)
Monocytes Absolute: 0.5 10*3/uL (ref 0.1–1.0)
Neutro Abs: 6.5 10*3/uL (ref 1.7–7.7)
RBC: 4.19 MIL/uL (ref 3.87–5.11)
WBC: 9.3 10*3/uL (ref 4.0–10.5)

## 2011-10-18 LAB — URINE MICROSCOPIC-ADD ON

## 2011-10-18 MED ORDER — PROMETHAZINE HCL 25 MG/ML IJ SOLN
25.0000 mg | Freq: Once | INTRAMUSCULAR | Status: AC
  Administered 2011-10-18: 25 mg via INTRAVENOUS
  Filled 2011-10-18: qty 1

## 2011-10-18 MED ORDER — KETOROLAC TROMETHAMINE 30 MG/ML IJ SOLN
30.0000 mg | Freq: Once | INTRAMUSCULAR | Status: AC
  Administered 2011-10-18: 30 mg via INTRAVENOUS
  Filled 2011-10-18: qty 1

## 2011-10-18 MED ORDER — SODIUM CHLORIDE 0.9 % IV SOLN
INTRAVENOUS | Status: DC
  Administered 2011-10-18: 15:00:00 via INTRAVENOUS

## 2011-10-18 MED ORDER — OXYCODONE-ACETAMINOPHEN 5-325 MG PO TABS: 1.0000 | ORAL_TABLET | Freq: Four times a day (QID) | ORAL | Status: AC | PRN

## 2011-10-18 MED ORDER — HYDROMORPHONE HCL PF 1 MG/ML IJ SOLN
1.0000 mg | Freq: Once | INTRAMUSCULAR | Status: AC
  Administered 2011-10-18: 1 mg via INTRAVENOUS
  Filled 2011-10-18: qty 1

## 2011-10-18 NOTE — ED Provider Notes (Signed)
History     CSN: [id number]  Arrival date & time 10/18/11  1314   First MD Initiated Contact with Patient 10/18/11 1500      No chief complaint on file.   (Consider location/radiation/quality/duration/timing/severity/associated sxs/prior treatment) The history is provided by the patient and medical records.   she is a 41 year old, female, with a history of scoliosis.  She complains of back pain.  That radiates around to the right side into the right lower quadrant of her abdomen.  The pain has been associated with nausea, vomiting, but no diarrhea.  She has not had fevers, chills, cough, or shortness of breath.  She states that she has not had any urine output since yesterday.  She has had a total hysterectomy.  She denies a history of,.  She has no weakness in her lower extremities.  Past Medical History  Diagnosis Date  . DJD (degenerative joint disease)   . Scoliosis   . Cancer     ovarian  . Kidney stones   . Anxiety     Past Surgical History  Procedure Date  . Abdominal hysterectomy     No family history on file.  History  Substance Use Topics  . Smoking status: Current Everyday Smoker -- 0.5 packs/day    Types: Cigarettes  . Smokeless tobacco: Never Used  . Alcohol Use: No     seldom    OB History    Grav Para Term Preterm Abortions TAB SAB Ect Mult Living                  Review of Systems  Constitutional: Negative for fever and chills.  HENT: Negative for neck pain.   Eyes: Negative for redness.  Respiratory: Negative for cough and shortness of breath.   Cardiovascular: Negative for chest pain.  Gastrointestinal: Positive for nausea, vomiting and abdominal pain. Negative for diarrhea and constipation.  Genitourinary: Negative for dysuria and hematuria.  Musculoskeletal: Positive for back pain.  Neurological: Positive for headaches.  Hematological: Does not bruise/bleed easily.  Psychiatric/Behavioral: Negative for confusion.  All other systems  reviewed and are negative.    Allergies  Morphine and related; Penicillins; and Zofran  Home Medications   Current Outpatient Rx  Name Route Sig Dispense Refill  . ALPRAZOLAM 0.5 MG PO TABS Oral Take 0.5 mg by mouth 3 (three) times daily.    Marland Kitchen ESTROGENS CONJUGATED 0.45 MG PO TABS Oral Take 0.45 mg by mouth daily.     Valetta Fuller HOT EX Apply externally Apply 1 patch topically every 8 (eight) hours as needed. For pain    . ADULT MULTIVITAMIN W/MINERALS CH Oral Take 1 tablet by mouth daily.      BP 98/70  Pulse 93  Temp 98.2 F (36.8 C) (Oral)  Resp 20  SpO2 97%  LMP 03/10/2005  Physical Exam  Nursing note and vitals reviewed. Constitutional: She is oriented to person, place, and time. She appears well-developed and well-nourished. No distress.  HENT:  Head: Normocephalic and atraumatic.  Eyes: Conjunctivae are normal.  Neck: Normal range of motion. Neck supple.  Cardiovascular: Normal rate and intact distal pulses.   No murmur heard. Pulmonary/Chest: Effort normal. No respiratory distress. She has no wheezes. She has no rales.  Abdominal: Soft. She exhibits no distension. There is tenderness. There is no rebound and no guarding.       Right lower quadrant tenderness, with no peritoneal signs.  No rigidity or guarding  Musculoskeletal: Normal range of motion.  Neurological: She is alert and oriented to person, place, and time.  Skin: Skin is warm and dry.  Psychiatric: She has a normal mood and affect. Thought content normal.    ED Course  Procedures (including critical care time) 41 year old, female, presents with back pain.  That radiates around the right side of her abdomen and the right lower quadrant.  She has not had trauma.  There is no fever.  She has not urinated since yesterday, so.  There are no urinary tract symptoms, and she has had a total hysterectomy.  On, examination.  She got tenderness in right lower quadrant.  We will perform laboratory testing, and treat her  with analgesics, and antiemetics.  Labs Reviewed  POCT I-STAT, CHEM 8 - Abnormal; Notable for the following:    Potassium 3.3 (*)     All other components within normal limits  CBC WITH DIFFERENTIAL  URINALYSIS, ROUTINE W REFLEX MICROSCOPIC   No results found.   No diagnosis found.    MDM  Back pain radiating to RLQ with ttp        Cheri Guppy, MD 10/18/11 [phone]

## 2011-10-18 NOTE — ED Notes (Signed)
Pt seen on Monday for back pain and was seen home on meds and pt is now here with vomiting and lower abdominal pain that started yesterday.  Pt reports that she is vomiting blood.  No diarrhea.

## 2011-10-18 NOTE — ED Notes (Signed)
Pt has not urinated since last nite at 8pm.  Mentating well

## 2011-10-21 ENCOUNTER — Emergency Department (HOSPITAL_COMMUNITY)
Admission: EM | Admit: 2011-10-21 | Discharge: 2011-10-21 | Disposition: A | Discharge status: Home / Self Care | Payer: Self-pay | Attending: Emergency Medicine | Admitting: Emergency Medicine

## 2011-10-21 ENCOUNTER — Emergency Department (HOSPITAL_COMMUNITY): Payer: Self-pay

## 2011-10-21 ENCOUNTER — Encounter (HOSPITAL_COMMUNITY): Payer: Self-pay | Admitting: *Deleted

## 2011-10-21 DIAGNOSIS — F17200 Nicotine dependence, unspecified, uncomplicated: Secondary | ICD-10-CM | POA: Insufficient documentation

## 2011-10-21 DIAGNOSIS — M4120 Other idiopathic scoliosis, site unspecified: Secondary | ICD-10-CM | POA: Insufficient documentation

## 2011-10-21 DIAGNOSIS — M25551 Pain in right hip: Secondary | ICD-10-CM

## 2011-10-21 DIAGNOSIS — M25559 Pain in unspecified hip: Secondary | ICD-10-CM | POA: Insufficient documentation

## 2011-10-21 DIAGNOSIS — Z8543 Personal history of malignant neoplasm of ovary: Secondary | ICD-10-CM | POA: Insufficient documentation

## 2011-10-21 DIAGNOSIS — M1990 Unspecified osteoarthritis, unspecified site: Secondary | ICD-10-CM | POA: Insufficient documentation

## 2011-10-21 MED ORDER — MELOXICAM 15 MG PO TABS: 15.0000 mg | ORAL_TABLET | Freq: Every day | ORAL | Status: DC

## 2011-10-21 MED ORDER — HYDROMORPHONE HCL PF 2 MG/ML IJ SOLN
2.0000 mg | Freq: Once | INTRAMUSCULAR | Status: AC
  Administered 2011-10-21: 2 mg via INTRAMUSCULAR
  Filled 2011-10-21: qty 1

## 2011-10-21 MED ORDER — PROMETHAZINE HCL 25 MG PO TABS
25.0000 mg | ORAL_TABLET | Freq: Once | ORAL | Status: AC
  Administered 2011-10-21: 25 mg via ORAL
  Filled 2011-10-21: qty 1

## 2011-10-21 NOTE — ED Notes (Signed)
Refused xray unless she gets pain med
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
## 2011-10-21 NOTE — ED Notes (Signed)
Pt to xray at this time via stretcher

## 2011-10-21 NOTE — ED Notes (Signed)
Pt was seen here last week and states she was standing up last nite and leg gave out and fell.  Pt has severe right hip pain and nausea

## 2011-10-21 NOTE — ED Provider Notes (Signed)
Medical screening examination/treatment/procedure(s) were performed by non-physician practitioner and as supervising physician I was immediately available for consultation/collaboration.   Rolan Bucco, MD 10/21/11 [phone]

## 2011-10-21 NOTE — ED Notes (Signed)
Pt unable to tolerate xray due to pain.

## 2011-10-21 NOTE — ED Notes (Addendum)
Pt refused to take discharge instructions or Rx.  Was cursing staff as she was leaving.  Pt limping very slowly.  Pt was witnessed after going out of ED door walking without any problems and running up steps to parking lot.  Remi Haggard NP made aware of same.

## 2011-11-03 ENCOUNTER — Encounter (HOSPITAL_COMMUNITY): Payer: Self-pay | Admitting: Family Medicine

## 2011-11-03 ENCOUNTER — Emergency Department (HOSPITAL_COMMUNITY)
Admission: EM | Admit: 2011-11-03 | Discharge: 2011-11-03 | Disposition: A | Discharge status: Home / Self Care | Payer: Self-pay | Attending: Emergency Medicine | Admitting: Emergency Medicine

## 2011-11-03 DIAGNOSIS — M4120 Other idiopathic scoliosis, site unspecified: Secondary | ICD-10-CM | POA: Insufficient documentation

## 2011-11-03 DIAGNOSIS — N898 Other specified noninflammatory disorders of vagina: Secondary | ICD-10-CM | POA: Insufficient documentation

## 2011-11-03 DIAGNOSIS — N939 Abnormal uterine and vaginal bleeding, unspecified: Secondary | ICD-10-CM

## 2011-11-03 DIAGNOSIS — F17200 Nicotine dependence, unspecified, uncomplicated: Secondary | ICD-10-CM | POA: Insufficient documentation

## 2011-11-03 DIAGNOSIS — R102 Pelvic and perineal pain unspecified side: Secondary | ICD-10-CM

## 2011-11-03 DIAGNOSIS — Z8543 Personal history of malignant neoplasm of ovary: Secondary | ICD-10-CM | POA: Insufficient documentation

## 2011-11-03 DIAGNOSIS — R109 Unspecified abdominal pain: Secondary | ICD-10-CM

## 2011-11-03 DIAGNOSIS — M1990 Unspecified osteoarthritis, unspecified site: Secondary | ICD-10-CM | POA: Insufficient documentation

## 2011-11-03 LAB — CBC WITH DIFFERENTIAL/PLATELET
Basophils Absolute: 0 10*3/uL (ref 0.0–0.1)
Basophils Relative: 0 % (ref 0–1)
Eosinophils Absolute: 0.1 10*3/uL (ref 0.0–0.7)
Eosinophils Relative: 1 % (ref 0–5)
HCT: 39.3 % (ref 36.0–46.0)
MCH: 31.6 pg (ref 26.0–34.0)
MCHC: 33.8 g/dL (ref 30.0–36.0)
MCV: 93.3 fL (ref 78.0–100.0)
Monocytes Absolute: 0.4 10*3/uL (ref 0.1–1.0)
Platelets: 263 10*3/uL (ref 150–400)
RDW: 12.3 % (ref 11.5–15.5)
WBC: 9.1 10*3/uL (ref 4.0–10.5)

## 2011-11-03 LAB — URINALYSIS, ROUTINE W REFLEX MICROSCOPIC
Nitrite: POSITIVE — AB
Protein, ur: NEGATIVE mg/dL
Specific Gravity, Urine: 1.019 (ref 1.005–1.030)
Urobilinogen, UA: 0.2 mg/dL (ref 0.0–1.0)

## 2011-11-03 LAB — BASIC METABOLIC PANEL
CO2: 26 mEq/L (ref 19–32)
Calcium: 9.7 mg/dL (ref 8.4–10.5)
GFR calc non Af Amer: >90 mL/min (ref >90)
Glucose, Bld: 93 mg/dL (ref 70–99)
Potassium: 4.2 mEq/L (ref 3.5–5.1)
Sodium: 143 mEq/L (ref 135–145)

## 2011-11-03 LAB — URINE MICROSCOPIC-ADD ON

## 2011-11-03 LAB — HEPATIC FUNCTION PANEL
Bilirubin, Direct: <0.1 mg/dL (ref 0.0–0.3)
Total Bilirubin: 0.3 mg/dL (ref 0.3–1.2)

## 2011-11-03 LAB — LIPASE, BLOOD: Lipase: 33 U/L (ref 11–59)

## 2011-11-03 MED ORDER — HYDROMORPHONE HCL PF 1 MG/ML IJ SOLN
1.0000 mg | Freq: Once | INTRAMUSCULAR | Status: AC
  Administered 2011-11-03: 1 mg via INTRAVENOUS
  Filled 2011-11-03: qty 1

## 2011-11-03 MED ORDER — METOCLOPRAMIDE HCL 5 MG/ML IJ SOLN
10.0000 mg | Freq: Once | INTRAMUSCULAR | Status: AC
  Administered 2011-11-03: 10 mg via INTRAVENOUS
  Filled 2011-11-03: qty 2

## 2011-11-03 MED ORDER — DIPHENHYDRAMINE HCL 50 MG/ML IJ SOLN
25.0000 mg | Freq: Once | INTRAMUSCULAR | Status: AC
  Administered 2011-11-03: 25 mg via INTRAVENOUS
  Filled 2011-11-03: qty 1

## 2011-11-03 MED ORDER — SODIUM CHLORIDE 0.9 % IV BOLUS (SEPSIS)
1000.0000 mL | Freq: Once | INTRAVENOUS | Status: AC
  Administered 2011-11-03: 1000 mL via INTRAVENOUS

## 2011-11-03 NOTE — ED Notes (Addendum)
Per pt sts lower abdominal and pelvic pain that started this morning associated with nausea, vomiting. sts pain radiates around to her back. Per pt sts also bright red bleeding from vagina on Saturday. Pt has had a hysterectomy

## 2011-11-03 NOTE — ED Notes (Signed)
Initial bladder scan largest amount was 111 ml.

## 2011-11-03 NOTE — ED Notes (Signed)
Bladder scan after catheterization was at largest 17 ml

## 2011-11-03 NOTE — ED Provider Notes (Signed)
History     CSN: [id number]  Arrival date & time 11/03/11  1216   First MD Initiated Contact with Patient 11/03/11 1502      Chief Complaint  Patient presents with  . Abdominal Pain    (Consider location/radiation/quality/duration/timing/severity/associated sxs/prior treatment) The history is provided by the patient and medical records.    Courtney [name] is a 41 y.o. female presents to the emergency department complaining of abdominal and pelvic pain.  The onset of the symptoms was  abrupt starting 30 hours ago.  The patient has associated vaginal bleeding, urinary retention, syncopal episode yesterday afternoon, lightheadedness, chest tenderness.  The symptoms have been  persistent, gradually worsened.  Movement, deep breathing, walking makes the symptoms worse and nothing makes symptoms better.  The patient denies fever, chills, shortness of breath, dizziness, weakness.  She states headache, nausea, vomiting, vaginal bleeding began together approximately 9:30 yesterday morning.  She states use of 7 maxi pads throughout yesterday with decreased bleeding this morning.  Patient has a history of total hysterectomy with oophorectomy 7 years ago.  No problems since then.  She also states no urination since 9:30 AM yesterday.   . The patient has medical history significant for:  Past Medical History  Diagnosis Date  . DJD (degenerative joint disease)   . Scoliosis   . Cancer     ovarian  . Kidney stones   . Anxiety     Past Surgical History  Procedure Date  . Abdominal hysterectomy     History reviewed. No pertinent family history.  History  Substance Use Topics  . Smoking status: Current Everyday Smoker -- 0.5 packs/day    Types: Cigarettes  . Smokeless tobacco: Never Used  . Alcohol Use: No     seldom    OB History    Grav Para Term Preterm Abortions TAB SAB Ect Mult Living                  Review of Systems  Constitutional: Negative for fever, diaphoresis, appetite  change, fatigue and unexpected weight change.  HENT: Negative for neck pain and neck stiffness.   Respiratory: Positive for chest tightness. Negative for shortness of breath.   Cardiovascular: Negative for chest pain.  Gastrointestinal: Positive for nausea, vomiting and abdominal pain. Negative for diarrhea, constipation, blood in stool, abdominal distention and rectal pain.  Genitourinary: Positive for decreased urine volume, vaginal bleeding and pelvic pain. Negative for dysuria, urgency, frequency, hematuria, flank pain, vaginal discharge, difficulty urinating, vaginal pain and menstrual problem.  Musculoskeletal: Negative for back pain.  Skin: Negative for rash.  Neurological: Positive for syncope, light-headedness and headaches. Negative for dizziness and weakness.  All other systems reviewed and are negative.    Allergies  Morphine and related; Penicillins; and Zofran  Home Medications   Current Outpatient Rx  Name Route Sig Dispense Refill  . ACETAMINOPHEN 500 MG PO TABS Oral Take 1,000 mg by mouth every 6 (six) hours as needed. For pain    . ALPRAZOLAM 1 MG PO TABS Oral Take 1 mg by mouth 3 (three) times daily as needed. anxiety    . ESTROGENS CONJUGATED 0.45 MG PO TABS Oral Take 0.45 mg by mouth daily.     . ADULT MULTIVITAMIN W/MINERALS CH Oral Take 1 tablet by mouth daily.      BP 109/72  Pulse 103  Temp 98.3 F (36.8 C)  Resp 18  SpO2 98%  LMP 03/10/2005  Physical Exam  Nursing note and vitals  reviewed. Constitutional: She appears well-developed and well-nourished.  HENT:  Head: Normocephalic and atraumatic.  Mouth/Throat: Oropharynx is clear and moist.  Eyes: Conjunctivae are normal. No scleral icterus.  Cardiovascular: Normal rate, regular rhythm and intact distal pulses.   Pulmonary/Chest: Effort normal and breath sounds normal.  Abdominal: Soft. Normal appearance and bowel sounds are normal. She exhibits no distension and no mass. There is generalized  tenderness (worse in the right lower quadrant). There is guarding. There is no rigidity, no rebound, no CVA tenderness and negative Murphy's sign.  Neurological: She is alert.  Skin: Skin is warm and dry. She is not diaphoretic.  Psychiatric: She has a normal mood and affect.    ED Course  Procedures (including critical care time)   Labs Reviewed  BASIC METABOLIC PANEL  CBC WITH DIFFERENTIAL  LIPASE, BLOOD  HEPATIC FUNCTION PANEL  URINALYSIS, ROUTINE W REFLEX MICROSCOPIC   No results found. Results for orders placed during the hospital encounter of 11/03/11  BASIC METABOLIC PANEL      Component Value Range   Sodium 143  135 - 145 mEq/L   Potassium 4.2  3.5 - 5.1 mEq/L   Chloride 107  96 - 112 mEq/L   CO2 26  19 - 32 mEq/L   Glucose, Bld 93  70 - 99 mg/dL   BUN 16  6 - 23 mg/dL   Creatinine, Ser 1.61  0.50 - 1.10 mg/dL   Calcium 9.7  8.4 - 09.6 mg/dL   GFR calc non Af Amer >90  >90 mL/min   GFR calc Af Amer >90  >90 mL/min  CBC WITH DIFFERENTIAL      Component Value Range   WBC 9.1  4.0 - 10.5 K/uL   RBC 4.21  3.87 - 5.11 MIL/uL   Hemoglobin 13.3  12.0 - 15.0 g/dL   HCT 04.5  40.9 - 81.1 %   MCV 93.3  78.0 - 100.0 fL   MCH 31.6  26.0 - 34.0 pg   MCHC 33.8  30.0 - 36.0 g/dL   RDW 91.4  78.2 - 95.6 %   Platelets 263  150 - 400 K/uL   Neutrophils Relative 64  43 - 77 %   Neutro Abs 5.8  1.7 - 7.7 K/uL   Lymphocytes Relative 30  12 - 46 %   Lymphs Abs 2.7  0.7 - 4.0 K/uL   Monocytes Relative 4  3 - 12 %   Monocytes Absolute 0.4  0.1 - 1.0 K/uL   Eosinophils Relative 1  0 - 5 %   Eosinophils Absolute 0.1  0.0 - 0.7 K/uL   Basophils Relative 0  0 - 1 %   Basophils Absolute 0.0  0.0 - 0.1 K/uL  LIPASE, BLOOD      Component Value Range   Lipase 33  11 - 59 U/L  HEPATIC FUNCTION PANEL      Component Value Range   Total Protein 6.8  6.0 - 8.3 g/dL   Albumin 3.9  3.5 - 5.2 g/dL   AST 18  0 - 37 U/L   ALT 29  0 - 35 U/L   Alkaline Phosphatase 49  39 - 117 U/L   Total  Bilirubin 0.3  0.3 - 1.2 mg/dL   Bilirubin, Direct <2.1  0.0 - 0.3 mg/dL   Indirect Bilirubin NOT CALCULATED  0.3 - 0.9 mg/dL     1. Abdominal pain   2. Pelvic pain in female   3. Vaginal bleeding  MDM  Courtney [name] presents with abdominal pain, vaginal bleeding, syncopal episode.  Concern for pelvic pathology versus appendicitis or other acute abdominal concerns.  I will obtain pelvic exam, labs, fluids and pain control and reevaluate.  Lipase, hepatic function, CBC, BMP all within normal limits.  Bladder scan before cath with 111 cc in and out cath with 109 cc,17 cc residual on post cath bladder scan.  Urinalysis indicative of urinary tract infection.  When I entered the room to reevaluate and perform a pelvic exam patient was gone. It appears as if she pulled her own IV out and there was blood on the bed and on the floor.  Patient's clothes and belongings were gone.  The nurse was unaware the patient had left.  Patient discharged from the floor AMA.        Dahlia Client Oriah Leinweber, PA-C 11/03/11 1754

## 2011-11-03 NOTE — ED Notes (Signed)
PA and this RN went to pt's room to do pelvic exam. Room was noted to be empty with pt's IV catheter and fluids hanging on side rail. Pt not seen in department.

## 2011-11-04 NOTE — ED Provider Notes (Signed)
Medical screening examination/treatment/procedure(s) were performed by non-physician practitioner and as supervising physician I was immediately available for consultation/collaboration.  Merilee Wible R. Areliz Rothman, MD 11/04/11 1449 

## 2011-11-16 ENCOUNTER — Emergency Department (HOSPITAL_COMMUNITY)
Admission: EM | Admit: 2011-11-16 | Discharge: 2011-11-17 | Disposition: A | Discharge status: Home / Self Care | Payer: Self-pay | Attending: Emergency Medicine | Admitting: Emergency Medicine

## 2011-11-16 DIAGNOSIS — R109 Unspecified abdominal pain: Secondary | ICD-10-CM | POA: Insufficient documentation

## 2011-11-16 DIAGNOSIS — R10819 Abdominal tenderness, unspecified site: Secondary | ICD-10-CM | POA: Insufficient documentation

## 2011-11-16 DIAGNOSIS — N390 Urinary tract infection, site not specified: Secondary | ICD-10-CM | POA: Insufficient documentation

## 2011-11-16 DIAGNOSIS — Z90710 Acquired absence of both cervix and uterus: Secondary | ICD-10-CM | POA: Insufficient documentation

## 2011-11-16 DIAGNOSIS — R110 Nausea: Secondary | ICD-10-CM | POA: Insufficient documentation

## 2011-11-16 MED ORDER — FENTANYL CITRATE 0.05 MG/ML IJ SOLN
50.0000 ug | Freq: Once | INTRAMUSCULAR | Status: AC
  Administered 2011-11-17: 50 ug via INTRAVENOUS
  Filled 2011-11-16: qty 2

## 2011-11-16 MED ORDER — LORAZEPAM 2 MG/ML IJ SOLN
1.0000 mg | Freq: Once | INTRAMUSCULAR | Status: AC
  Administered 2011-11-17: 1 mg via INTRAVENOUS
  Filled 2011-11-16: qty 1

## 2011-11-16 MED ORDER — SODIUM CHLORIDE 0.9 % IV BOLUS (SEPSIS)
500.0000 mL | Freq: Once | INTRAVENOUS | Status: AC
  Administered 2011-11-17: 500 mL via INTRAVENOUS

## 2011-11-16 NOTE — ED Notes (Signed)
Pt sts she has pelvic pain that started at noon. Pt took tylenol  At 2100. VSS. pwd

## 2011-11-17 LAB — CBC WITH DIFFERENTIAL/PLATELET
Eosinophils Absolute: 0.3 10*3/uL (ref 0.0–0.7)
Eosinophils Relative: 4 % (ref 0–5)
HCT: 36.8 % (ref 36.0–46.0)
Lymphocytes Relative: 40 % (ref 12–46)
Lymphs Abs: 2.9 10*3/uL (ref 0.7–4.0)
MCH: 32.2 pg (ref 26.0–34.0)
MCV: 94.1 fL (ref 78.0–100.0)
Monocytes Absolute: 0.6 10*3/uL (ref 0.1–1.0)
Platelets: 314 10*3/uL (ref 150–400)
RBC: 3.91 MIL/uL (ref 3.87–5.11)
RDW: 12.3 % (ref 11.5–15.5)
WBC: 7.3 10*3/uL (ref 4.0–10.5)

## 2011-11-17 LAB — URINALYSIS, ROUTINE W REFLEX MICROSCOPIC
Bilirubin Urine: NEGATIVE
Glucose, UA: NEGATIVE mg/dL
Hgb urine dipstick: NEGATIVE
Protein, ur: NEGATIVE mg/dL
Urobilinogen, UA: 1 mg/dL (ref 0.0–1.0)

## 2011-11-17 LAB — POCT I-STAT, CHEM 8
BUN: 18 mg/dL (ref 6–23)
Calcium, Ion: 1.15 mmol/L (ref 1.12–1.23)
Creatinine, Ser: 0.7 mg/dL (ref 0.50–1.10)
Glucose, Bld: 91 mg/dL (ref 70–99)
Hemoglobin: 12.2 g/dL (ref 12.0–15.0)
TCO2: 26 mmol/L (ref 0–100)

## 2011-11-17 MED ORDER — HYDROCODONE-ACETAMINOPHEN 5-325 MG PO TABS
1.0000 | ORAL_TABLET | Freq: Once | ORAL | Status: AC
  Administered 2011-11-17: 1 via ORAL
  Filled 2011-11-17: qty 1

## 2011-11-17 MED ORDER — HYDROCODONE-ACETAMINOPHEN 5-325 MG PO TABS: 1.0000 | ORAL_TABLET | Freq: Four times a day (QID) | ORAL | Status: DC | PRN

## 2011-11-17 MED ORDER — SODIUM CHLORIDE 0.9 % IV BOLUS (SEPSIS)
1000.0000 mL | Freq: Once | INTRAVENOUS | Status: AC
  Administered 2011-11-17: 1000 mL via INTRAVENOUS

## 2011-11-17 NOTE — ED Provider Notes (Signed)
Medical screening examination/treatment/procedure(s) were performed by non-physician practitioner and as supervising physician I was immediately available for consultation/collaboration.   Hanley Seamen, MD 11/17/11 [phone]

## 2011-11-17 NOTE — ED Provider Notes (Signed)
History     CSN: [id number]  Arrival date & time 11/16/11  2142   First MD Initiated Contact with Patient 11/16/11 2233      Chief Complaint  Patient presents with  . Pelvic Pain    (Consider location/radiation/quality/duration/timing/severity/associated sxs/prior treatment) HPI Comments: Patient was recently diagnosed with UTI started on Bactrim.  Patient initially stated she was getting better, but today developed bilateral lower, abdominal pain, radiating to bilateral flanks, and nausea.  Patient has had a hysterectomy.  She denies vaginal discharge, constipation, to the abdomen  Patient is a 41 y.o. female presenting with pelvic pain. The history is provided by the patient.  Pelvic Pain This is a recurrent problem. The current episode started in the past 7 days. The problem occurs intermittently. The problem has been gradually worsening. Associated symptoms include abdominal pain and nausea. Pertinent negatives include no fever, neck pain or numbness.    Past Medical History  Diagnosis Date  . DJD (degenerative joint disease)   . Scoliosis   . Cancer     ovarian  . Kidney stones   . Anxiety     Past Surgical History  Procedure Date  . Abdominal hysterectomy     No family history on file.  History  Substance Use Topics  . Smoking status: Current Everyday Smoker -- 0.5 packs/day    Types: Cigarettes  . Smokeless tobacco: Never Used  . Alcohol Use: No     seldom    OB History    Grav Para Term Preterm Abortions TAB SAB Ect Mult Living                  Review of Systems  Constitutional: Negative for fever.  HENT: Negative for neck pain.   Gastrointestinal: Positive for nausea and abdominal pain. Negative for diarrhea, constipation and blood in stool.  Genitourinary: Positive for flank pain and pelvic pain. Negative for dysuria and frequency.  Neurological: Negative for numbness.    Allergies  Morphine and related; Penicillins; and Zofran  Home  Medications   Current Outpatient Rx  Name Route Sig Dispense Refill  . ACETAMINOPHEN 500 MG PO TABS Oral Take 1,000 mg by mouth every 6 (six) hours as needed. For pain    . ALPRAZOLAM 1 MG PO TABS Oral Take 1 mg by mouth 3 (three) times daily as needed. anxiety    . ESTROGENS CONJUGATED 0.45 MG PO TABS Oral Take 0.45 mg by mouth daily.     . ADULT MULTIVITAMIN W/MINERALS CH Oral Take 1 tablet by mouth daily.    Marland Kitchen PHENAZOPYRIDINE HCL 200 MG PO TABS Oral Take 200 mg by mouth 3 (three) times daily.    . SULFAMETHOXAZOLE-TMP DS 800-160 MG PO TABS Oral Take 1 tablet by mouth 2 (two) times daily.    Marland Kitchen HYDROCODONE-ACETAMINOPHEN 5-325 MG PO TABS Oral Take 1 tablet by mouth every 6 (six) hours as needed for pain. 5 tablet 0    BP 94/61  Pulse 64  Temp 97.9 F (36.6 C) (Oral)  Resp 18  Ht 5\' 4"  (1.626 m)  SpO2 93%  LMP 03/10/2005  Physical Exam  Constitutional: She is oriented to person, place, and time. She appears well-developed and well-nourished.  HENT:  Head: Normocephalic.  Eyes: Pupils are equal, round, and reactive to light.  Neck: Normal range of motion.  Cardiovascular: Normal rate.   Pulmonary/Chest: Effort normal.  Abdominal: Soft. Bowel sounds are normal. There is tenderness.       Diffusely tender  Musculoskeletal: Normal range of motion.  Neurological: She is alert and oriented to person, place, and time.  Skin: Skin is warm.    ED Course  Procedures (including critical care time)  Labs Reviewed  URINALYSIS, ROUTINE W REFLEX MICROSCOPIC - Abnormal; Notable for the following:    APPearance CLOUDY (*)     Leukocytes, UA SMALL (*)     All other components within normal limits  CBC WITH DIFFERENTIAL  POCT I-STAT, CHEM 8  URINE MICROSCOPIC-ADD ON   No results found.   1. UTI (lower urinary tract infection)   2. Abdominal pain       MDM   Patient has been, sleeping soundly, blood pressure has been consistently in the 90s, heart rate in the 70s.  Patient  appears in no distress.  She has not complained of any, nausea.  She has had no episodes of vomiting.  Her urine was reviewed and it is improving        Arman Filter, NP 11/17/11 0244  Arman Filter, NP 11/17/11 [phone]

## 2011-11-20 ENCOUNTER — Emergency Department (HOSPITAL_COMMUNITY)
Admission: EM | Admit: 2011-11-20 | Discharge: 2011-11-20 | Disposition: A | Discharge status: Home / Self Care | Payer: Self-pay | Attending: Emergency Medicine | Admitting: Emergency Medicine

## 2011-11-20 ENCOUNTER — Encounter (HOSPITAL_COMMUNITY): Payer: Self-pay | Admitting: *Deleted

## 2011-11-20 ENCOUNTER — Emergency Department (HOSPITAL_COMMUNITY): Payer: Self-pay

## 2011-11-20 DIAGNOSIS — F17200 Nicotine dependence, unspecified, uncomplicated: Secondary | ICD-10-CM | POA: Insufficient documentation

## 2011-11-20 DIAGNOSIS — R1110 Vomiting, unspecified: Secondary | ICD-10-CM | POA: Insufficient documentation

## 2011-11-20 DIAGNOSIS — M549 Dorsalgia, unspecified: Secondary | ICD-10-CM | POA: Insufficient documentation

## 2011-11-20 DIAGNOSIS — R109 Unspecified abdominal pain: Secondary | ICD-10-CM | POA: Insufficient documentation

## 2011-11-20 DIAGNOSIS — F411 Generalized anxiety disorder: Secondary | ICD-10-CM | POA: Insufficient documentation

## 2011-11-20 DIAGNOSIS — Z8543 Personal history of malignant neoplasm of ovary: Secondary | ICD-10-CM | POA: Insufficient documentation

## 2011-11-20 LAB — BASIC METABOLIC PANEL
BUN: 12 mg/dL (ref 6–23)
CO2: 19 mEq/L (ref 19–32)
Calcium: 9.5 mg/dL (ref 8.4–10.5)
Glucose, Bld: 81 mg/dL (ref 70–99)
Sodium: 134 mEq/L — ABNORMAL LOW (ref 135–145)

## 2011-11-20 LAB — CBC WITH DIFFERENTIAL/PLATELET
Eosinophils Relative: 3 % (ref 0–5)
HCT: 44.5 % (ref 36.0–46.0)
Hemoglobin: 15.5 g/dL — ABNORMAL HIGH (ref 12.0–15.0)
Lymphocytes Relative: 31 % (ref 12–46)
Lymphs Abs: 2.4 10*3/uL (ref 0.7–4.0)
MCV: 91.9 fL (ref 78.0–100.0)
Monocytes Relative: 9 % (ref 3–12)
Platelets: 271 10*3/uL (ref 150–400)
RBC: 4.84 MIL/uL (ref 3.87–5.11)
WBC: 7.6 10*3/uL (ref 4.0–10.5)

## 2011-11-20 LAB — URINALYSIS, ROUTINE W REFLEX MICROSCOPIC
Bilirubin Urine: NEGATIVE
Glucose, UA: NEGATIVE mg/dL
Hgb urine dipstick: NEGATIVE
Specific Gravity, Urine: 1.009 (ref 1.005–1.030)
Urobilinogen, UA: 0.2 mg/dL (ref 0.0–1.0)
pH: 7 (ref 5.0–8.0)

## 2011-11-20 LAB — WET PREP, GENITAL: Yeast Wet Prep HPF POC: NONE SEEN

## 2011-11-20 MED ORDER — PROMETHAZINE HCL 25 MG/ML IJ SOLN
12.5000 mg | Freq: Once | INTRAMUSCULAR | Status: AC
  Administered 2011-11-20: 12.5 mg via INTRAVENOUS
  Filled 2011-11-20: qty 1

## 2011-11-20 MED ORDER — FENTANYL CITRATE 0.05 MG/ML IJ SOLN
100.0000 ug | Freq: Once | INTRAMUSCULAR | Status: AC
  Administered 2011-11-20: 100 ug via INTRAVENOUS
  Filled 2011-11-20: qty 2

## 2011-11-20 MED ORDER — PROMETHAZINE HCL 25 MG PO TABS: 25.0000 mg | ORAL_TABLET | Freq: Four times a day (QID) | ORAL | Status: AC | PRN

## 2011-11-20 MED ORDER — TRAMADOL HCL 50 MG PO TABS: 50.0000 mg | ORAL_TABLET | Freq: Four times a day (QID) | ORAL | Status: AC | PRN

## 2011-11-20 MED ORDER — TRAMADOL HCL 50 MG PO TABS: 50.0000 mg | ORAL_TABLET | Freq: Four times a day (QID) | ORAL | Status: DC | PRN

## 2011-11-20 MED ORDER — SODIUM CHLORIDE 0.9 % IV BOLUS (SEPSIS)
1000.0000 mL | Freq: Once | INTRAVENOUS | Status: AC
  Administered 2011-11-20: 1000 mL via INTRAVENOUS

## 2011-11-20 MED ORDER — KETOROLAC TROMETHAMINE 30 MG/ML IJ SOLN
30.0000 mg | Freq: Once | INTRAMUSCULAR | Status: AC
  Administered 2011-11-20: 30 mg via INTRAVENOUS
  Filled 2011-11-20: qty 1

## 2011-11-20 NOTE — ED Notes (Signed)
At time of discharge pt cursing and fussing that she should have just stayed at home because we didn't do anything for her; went over discharge instructions with patient and reviewed meds that were given including fentanyl/toradol and phenergan while in ER; pt stated that they did nothing for her; pt requesting more medication; Theron Arista notified and stated was not going to give more meds at this time; pt stating she would just go home because she wasn't going to stay any longer; reviewed with pt followup with [facility]; pt tore RX for phenergan and ultram off of discharge papers and threw in trash can--retrieved and shredded due to pt stating she was not going to get them filled.  Pt walking without difficulty; family at bedside with patient with patient yelling at family member that she should have just stayed home.  Tried to talk with patient and patient continues to yell and walked out of department after signing for discharge

## 2011-11-20 NOTE — ED Provider Notes (Signed)
History     CSN: [id number]  Arrival date & time 11/20/11  1739   First MD Initiated Contact with Patient 11/20/11 2009      Chief Complaint  Patient presents with  . Back Pain  . Emesis   HPI  History provided by the patient. Patient is a 41 year old female with history of kidney stones, scoliosis and degenerative joint disease who presents with complaints of persistent nausea vomiting, right flank pain that has worsened over the past day and a half. Patient reports being seen 4 days ago for lower abdominal pain and pressure. She was diagnosed with UTI at that time. Patient reports taking all Macrobid prescription she was given. Yesterday evening and all day today patient developed nausea vomiting symptoms with right flank pains. Patient had some leftover Phenergan prescription at home which seemed to help with some of her nausea vomiting but she ran out and has since had multiple episodes of vomiting. Symptoms feel somewhat similar to past kidney stones. Patient states urine is still dark.    Past Medical History  Diagnosis Date  . DJD (degenerative joint disease)   . Scoliosis   . Cancer     ovarian  . Kidney stones   . Anxiety     Past Surgical History  Procedure Date  . Abdominal hysterectomy     History reviewed. No pertinent family history.  History  Substance Use Topics  . Smoking status: Current Every Day Smoker -- 0.5 packs/day    Types: Cigarettes  . Smokeless tobacco: Never Used  . Alcohol Use: No     seldom    OB History    Grav Para Term Preterm Abortions TAB SAB Ect Mult Living                  Review of Systems  Constitutional: Negative for fever, chills and appetite change.  Gastrointestinal: Negative for nausea, vomiting and abdominal pain.  Genitourinary: Positive for flank pain. Negative for dysuria, frequency, hematuria, vaginal bleeding and vaginal discharge.  Musculoskeletal: Positive for back pain.    Allergies  Morphine and related;  Penicillins; and Zofran  Home Medications   Current Outpatient Rx  Name Route Sig Dispense Refill  . ACETAMINOPHEN 500 MG PO TABS Oral Take 1,000 mg by mouth every 6 (six) hours as needed. For pain    . ALPRAZOLAM 1 MG PO TABS Oral Take 1 mg by mouth 3 (three) times daily as needed. anxiety    . ESTROGENS CONJUGATED 0.45 MG PO TABS Oral Take 0.45 mg by mouth daily.     . ADULT MULTIVITAMIN W/MINERALS CH Oral Take 1 tablet by mouth daily.      BP 103/83  Pulse 77  Temp 98.5 F (36.9 C) (Oral)  Resp 18  SpO2 100%  LMP 03/10/2005  Physical Exam  Nursing note and vitals reviewed. Constitutional: She is oriented to person, place, and time. She appears well-developed and well-nourished. No distress.  HENT:  Head: Normocephalic.  Cardiovascular: Normal rate and regular rhythm.   Pulmonary/Chest: Effort normal and breath sounds normal.  Abdominal: Soft. There is tenderness in the right lower quadrant. There is CVA tenderness. There is no rebound and no guarding.       Right CVA tenderness   Genitourinary:       Chaperone was present.  Neurological: She is alert and oriented to person, place, and time.  Skin: Skin is warm and dry. No rash noted.  Psychiatric: She has a normal mood and  affect. Her behavior is normal.    ED Course  Procedures   Results for orders placed during the hospital encounter of 11/20/11  URINALYSIS, ROUTINE W REFLEX MICROSCOPIC      Component Value Range   Color, Urine YELLOW  YELLOW   APPearance CLEAR  CLEAR   Specific Gravity, Urine 1.009  1.005 - 1.030   pH 7.0  5.0 - 8.0   Glucose, UA NEGATIVE  NEGATIVE mg/dL   Hgb urine dipstick NEGATIVE  NEGATIVE   Bilirubin Urine NEGATIVE  NEGATIVE   Ketones, ur NEGATIVE  NEGATIVE mg/dL   Protein, ur NEGATIVE  NEGATIVE mg/dL   Urobilinogen, UA 0.2  0.0 - 1.0 mg/dL   Nitrite NEGATIVE  NEGATIVE   Leukocytes, UA NEGATIVE  NEGATIVE  CBC WITH DIFFERENTIAL      Component Value Range   WBC 7.6  4.0 - 10.5 K/uL    RBC 4.84  3.87 - 5.11 MIL/uL   Hemoglobin 15.5 (*) 12.0 - 15.0 g/dL   HCT 16.1  09.6 - 04.5 %   MCV 91.9  78.0 - 100.0 fL   MCH 32.0  26.0 - 34.0 pg   MCHC 34.8  30.0 - 36.0 g/dL   RDW 40.9  81.1 - 91.4 %   Platelets 271  150 - 400 K/uL   Neutrophils Relative 56  43 - 77 %   Neutro Abs 4.3  1.7 - 7.7 K/uL   Lymphocytes Relative 31  12 - 46 %   Lymphs Abs 2.4  0.7 - 4.0 K/uL   Monocytes Relative 9  3 - 12 %   Monocytes Absolute 0.7  0.1 - 1.0 K/uL   Eosinophils Relative 3  0 - 5 %   Eosinophils Absolute 0.2  0.0 - 0.7 K/uL   Basophils Relative 0  0 - 1 %   Basophils Absolute 0.0  0.0 - 0.1 K/uL  BASIC METABOLIC PANEL      Component Value Range   Sodium 134 (*) 135 - 145 mEq/L   Potassium 4.4  3.5 - 5.1 mEq/L   Chloride 102  96 - 112 mEq/L   CO2 19  19 - 32 mEq/L   Glucose, Bld 81  70 - 99 mg/dL   BUN 12  6 - 23 mg/dL   Creatinine, Ser 7.82  0.50 - 1.10 mg/dL   Calcium 9.5  8.4 - 95.6 mg/dL   GFR calc non Af Amer >90  >90 mL/min   GFR calc Af Amer >90  >90 mL/min  WET PREP, GENITAL      Component Value Range   Yeast Wet Prep HPF POC NONE SEEN  NONE SEEN   Trich, Wet Prep NONE SEEN  NONE SEEN   Clue Cells Wet Prep HPF POC FEW (*) NONE SEEN   WBC, Wet Prep HPF POC FEW (*) NONE SEEN       Dg Abd Acute W/chest  11/20/2011  *RADIOLOGY REPORT*  Clinical Data: Right flank pain and nausea.  Smoker.  ACUTE ABDOMEN SERIES (ABDOMEN 2 VIEW & CHEST 1 VIEW)  Comparison: Previous examinations.  Findings: Normal sized heart.  Clear lungs.  Mild diffuse peribronchial thickening and accentuation of the interstitial markings.  Normal bowel gas pattern without free peritoneal air. Mild scoliosis.  IMPRESSION:  1.  No acute abnormality. 2.  Mild changes of COPD and chronic bronchitis.   Original Report Authenticated By: Darrol Angel, M.D.      1. Flank pain  MDM  8:20PM patient seen and evaluated. Patient returns today with complaints of right flank and groin pain. Patient also  has multiple episodes of nausea vomiting.        Angus Seller, [place] 11/21/11 [phone]

## 2011-11-20 NOTE — ED Notes (Signed)
Pt c/o back pain, and vomiting. States has hx of kidney stones and was recently treated for UTI. Pt drinking sprite in triage.

## 2011-11-20 NOTE — ED Notes (Signed)
Pelvic cart at bedside. 

## 2011-11-21 LAB — GC/CHLAMYDIA PROBE AMP, GENITAL: Chlamydia, DNA Probe: NEGATIVE

## 2011-11-22 NOTE — ED Provider Notes (Signed)
Medical screening examination/treatment/procedure(s) were performed by non-physician practitioner and as supervising physician I was immediately available for consultation/collaboration.  Cyndra Numbers, MD 11/22/11 1436

## 2011-12-02 ENCOUNTER — Emergency Department (HOSPITAL_COMMUNITY): Payer: Self-pay

## 2011-12-02 ENCOUNTER — Encounter (HOSPITAL_COMMUNITY): Payer: Self-pay | Admitting: *Deleted

## 2011-12-02 ENCOUNTER — Emergency Department (HOSPITAL_COMMUNITY)
Admission: EM | Admit: 2011-12-02 | Discharge: 2011-12-02 | Disposition: A | Discharge status: Home / Self Care | Payer: Self-pay | Attending: Emergency Medicine | Admitting: Emergency Medicine

## 2011-12-02 DIAGNOSIS — R109 Unspecified abdominal pain: Secondary | ICD-10-CM

## 2011-12-02 DIAGNOSIS — R079 Chest pain, unspecified: Secondary | ICD-10-CM | POA: Insufficient documentation

## 2011-12-02 DIAGNOSIS — F17200 Nicotine dependence, unspecified, uncomplicated: Secondary | ICD-10-CM | POA: Insufficient documentation

## 2011-12-02 DIAGNOSIS — R0602 Shortness of breath: Secondary | ICD-10-CM | POA: Insufficient documentation

## 2011-12-02 LAB — COMPREHENSIVE METABOLIC PANEL
Alkaline Phosphatase: 75 U/L (ref 39–117)
BUN: 14 mg/dL (ref 6–23)
GFR calc Af Amer: >90 mL/min (ref >90)
GFR calc non Af Amer: >90 mL/min (ref >90)
Glucose, Bld: 109 mg/dL — ABNORMAL HIGH (ref 70–99)
Potassium: 4 mEq/L (ref 3.5–5.1)
Total Bilirubin: 0.5 mg/dL (ref 0.3–1.2)
Total Protein: 7.6 g/dL (ref 6.0–8.3)

## 2011-12-02 LAB — URINALYSIS, ROUTINE W REFLEX MICROSCOPIC
Nitrite: NEGATIVE
Protein, ur: NEGATIVE mg/dL
Urobilinogen, UA: 1 mg/dL (ref 0.0–1.0)

## 2011-12-02 LAB — RAPID URINE DRUG SCREEN, HOSP PERFORMED: Barbiturates: NOT DETECTED

## 2011-12-02 LAB — MAGNESIUM: Magnesium: 1.8 mg/dL (ref 1.5–2.5)

## 2011-12-02 LAB — LIPASE, BLOOD: Lipase: 19 U/L (ref 11–59)

## 2011-12-02 LAB — PREGNANCY, URINE: Preg Test, Ur: NEGATIVE

## 2011-12-02 LAB — CBC
HCT: 43.8 % (ref 36.0–46.0)
Hemoglobin: 15.2 g/dL — ABNORMAL HIGH (ref 12.0–15.0)
MCHC: 34.7 g/dL (ref 30.0–36.0)

## 2011-12-02 LAB — POCT I-STAT TROPONIN I

## 2011-12-02 MED ORDER — LORAZEPAM 2 MG/ML IJ SOLN
INTRAMUSCULAR | Status: AC
  Administered 2011-12-02: 1 mg via INTRAVENOUS
  Filled 2011-12-02: qty 1

## 2011-12-02 MED ORDER — ONDANSETRON HCL 4 MG/2ML IJ SOLN
4.0000 mg | Freq: Once | INTRAMUSCULAR | Status: AC
  Administered 2011-12-02: 4 mg via INTRAVENOUS

## 2011-12-02 MED ORDER — LORAZEPAM 2 MG/ML IJ SOLN
1.0000 mg | Freq: Once | INTRAMUSCULAR | Status: AC
  Administered 2011-12-02: 1 mg via INTRAVENOUS

## 2011-12-02 MED ORDER — ASPIRIN 81 MG PO CHEW
324.0000 mg | CHEWABLE_TABLET | Freq: Once | ORAL | Status: AC
  Administered 2011-12-02: 324 mg via ORAL
  Filled 2011-12-02: qty 4

## 2011-12-02 MED ORDER — HYDROMORPHONE HCL PF 1 MG/ML IJ SOLN
1.0000 mg | Freq: Once | INTRAMUSCULAR | Status: AC
  Administered 2011-12-02: 1 mg via INTRAVENOUS
  Filled 2011-12-02: qty 1

## 2011-12-02 MED ORDER — ONDANSETRON HCL 4 MG/2ML IJ SOLN
INTRAMUSCULAR | Status: AC
  Administered 2011-12-02: 4 mg via INTRAVENOUS
  Filled 2011-12-02: qty 2

## 2011-12-02 MED ORDER — OXYCODONE-ACETAMINOPHEN 5-325 MG PO TABS: ORAL_TABLET | ORAL | Status: DC

## 2011-12-02 NOTE — ED Notes (Signed)
Pt states started having L sided chest pain this morning around 7:30 am, states having numbness/tingling down L arm and hand, having shortness of breath, denies blurred vision, does have a hx of anxiety, is suppose to take xanax but cannot afford to get the prescription filled, pt states having family problems also. Pt has had this happen about 4 years ago where she was hospitalized.

## 2011-12-02 NOTE — ED Provider Notes (Signed)
History     CSN: [id number]  Arrival date & time 12/02/11  0906   First MD Initiated Contact with Patient 12/02/11 [phone]      No chief complaint on file.   (Consider location/radiation/quality/duration/timing/severity/associated sxs/prior treatment) HPI  41 y.o. female appears acutely agitated complaining of left-sided chest pain with acute onset this morning at 7:30 pain is described as sharp 14 out of 10 with radiation to the left arm in addition to shortness of breath. Patient has history of anxiety  And states that this does not feel like prior anxiety attack. She should be on Xanax but cannot afford it. Patient denies any nausea/vomiting, palpitations, change in bowel or bladder habits, illicit drug use or alcohol abuse, or family history of early cardiac death.  Past Medical History  Diagnosis Date  . DJD (degenerative joint disease)   . Scoliosis   . Cancer     ovarian  . Kidney stones   . Anxiety     Past Surgical History  Procedure Date  . Abdominal hysterectomy     History reviewed. No pertinent family history.  History  Substance Use Topics  . Smoking status: Current Every Day Smoker -- 0.5 packs/day    Types: Cigarettes  . Smokeless tobacco: Never Used  . Alcohol Use: No     seldom    OB History    Grav Para Term Preterm Abortions TAB SAB Ect Mult Living                  Review of Systems  Constitutional: Negative for fever.  Respiratory: Positive for shortness of breath.   Cardiovascular: Positive for chest pain.  Gastrointestinal: Negative for nausea, vomiting, abdominal pain and diarrhea.  All other systems reviewed and are negative.    Allergies  Morphine and related; Penicillins; and Zofran  Home Medications   Current Outpatient Rx  Name Route Sig Dispense Refill  . ACETAMINOPHEN 500 MG PO TABS Oral Take 1,000 mg by mouth every 6 (six) hours as needed. For pain    . ALPRAZOLAM 1 MG PO TABS Oral Take 1 mg by mouth 3 (three) times daily  as needed. anxiety    . ESTROGENS CONJUGATED 0.45 MG PO TABS Oral Take 0.45 mg by mouth daily.     . ADULT MULTIVITAMIN W/MINERALS CH Oral Take 1 tablet by mouth daily.      BP 160/124  Pulse 96  Temp 98.3 F (36.8 C) (Oral)  SpO2 100%  LMP 03/10/2005  Physical Exam  Nursing note and vitals reviewed. Constitutional: She is oriented to person, place, and time. She appears well-developed and well-nourished. No distress.  HENT:  Head: Normocephalic.  Mouth/Throat: Oropharynx is clear and moist.  Eyes: Conjunctivae normal and EOM are normal. Pupils are equal, round, and reactive to light.  Neck: No JVD present.  Cardiovascular: Normal rate, regular rhythm, normal heart sounds and intact distal pulses.   Pulmonary/Chest: Effort normal and breath sounds normal. No stridor. No respiratory distress. She has no wheezes. She has no rales. She exhibits no tenderness.  Abdominal: Soft. Bowel sounds are normal. She exhibits no distension and no mass. There is tenderness. There is no rebound and no guarding.       Mild tenderness to the left upper quadrant. Murphy sign negative.  Musculoskeletal: Normal range of motion. She exhibits no edema and no tenderness.  Lymphadenopathy:    She has no cervical adenopathy.  Neurological: She is alert and oriented to person, place, and time.  Psychiatric: She has a normal mood and affect.    ED Course  Procedures (including critical care time)  Labs Reviewed  CBC - Abnormal; Notable for the following:    Hemoglobin 15.2 (*)     All other components within normal limits  COMPREHENSIVE METABOLIC PANEL - Abnormal; Notable for the following:    Glucose, Bld 109 (*)     AST 193 (*)     ALT 223 (*)     All other components within normal limits  URINALYSIS, ROUTINE W REFLEX MICROSCOPIC - Abnormal; Notable for the following:    Color, Urine AMBER (*)  BIOCHEMICALS MAY BE AFFECTED BY COLOR   APPearance CLOUDY (*)     Bilirubin Urine SMALL (*)      Ketones, ur TRACE (*)     Leukocytes, UA SMALL (*)     All other components within normal limits  URINE MICROSCOPIC-ADD ON - Abnormal; Notable for the following:    Squamous Epithelial / LPF FEW (*)     Bacteria, UA FEW (*)     All other components within normal limits  MAGNESIUM  PREGNANCY, URINE  POCT I-STAT TROPONIN I  LIPASE, BLOOD  URINE RAPID DRUG SCREEN (HOSP PERFORMED)   Dg Chest 2 View  12/02/2011  *RADIOLOGY REPORT*  Clinical Data: Chest pain and vomiting.  Smoker.  CHEST - 2 VIEW  Comparison: 03/07/2010 and 03/06/2009.  Findings: The heart size and mediastinal contours are normal. The lungs are clear. There is no pleural effusion or pneumothorax. No acute osseous findings are identified.  There is a mild thoracolumbar scoliosis which appears unchanged.  IMPRESSION: Stable examination.  No active cardiopulmonary process.   Original Report Authenticated By: Gerrianne Scale, M.D.    US Abdomen Complete  12/02/2011  *RADIOLOGY REPORT*  Clinical Data:  Left chest pain, elevated LFTs  ULTRASOUND ABDOMEN:  Technique:  Sonography of upper abdominal structures was performed.  Comparison:  None Correlation:  CT abdomen 10/02/2011  Gallbladder:  Normally distended without stones or wall thickening. No pericholecystic fluid or sonographic Murphy sign.  Common bile duct:  Normal caliber 4 mm diameter  Liver:  Normal appearance  IVC:  Normal appearance  Pancreas:  Distal tail and portions of head/uncinate obscured by bowel gas.  Visualized portions normal appearance.  Spleen:  Normal appearance, 6.4 cm length  Right kidney:  10.7 cm length. Normal morphology without mass or hydronephrosis.  Left kidney:  10.9 cm length. Normal morphology without mass or hydronephrosis.  Aorta:  Normal caliber  Other:  No free fluid  IMPRESSION: Incomplete pancreatic visualization. Otherwise negative exam.   Original Report Authenticated By: Lollie Marrow, M.D.      Date: 12/02/2011  Rate:92  Rhythm: normal sinus  rhythm  QRS Axis: normal  Intervals: normal  ST/T Wave abnormalities: normal  Conduction Disutrbances:none  Narrative Interpretation:   Old EKG Reviewed: unchanged '  1. Abdominal  pain, other specified site       MDM  41 y.o. female appears acutely agitated complaining of left chest pain with acute onset this morning at 7:30 AM. Patient denies any illicit drug use.   Patient is low risk by Wells criteria and PERC negative. Patient has not had Premarin in over a year.  Patient is significantly relieved with Ativan and Dilaudid.   Labs and imaging normal, however, there is mild elevation in her LFTs. Will advise patient to followup as an outpatient for possible viral hepatitis. Patient states she was tested one year  ago for hepatitis and negative.   Discussed case with attending who agrees with plan and stability to d/c to home.  Pt verbalized understanding and agrees with care plan. Outpatient follow-up and return precautions given.        Wynetta Emery, PA-C 12/03/11 [phone]

## 2011-12-03 NOTE — ED Provider Notes (Signed)
Medical screening examination/treatment/procedure(s) were performed by non-physician practitioner and as supervising physician I was immediately available for consultation/collaboration.   Celene Kras, MD 12/03/11 [phone]

## 2011-12-13 ENCOUNTER — Emergency Department (HOSPITAL_COMMUNITY)
Admission: EM | Admit: 2011-12-13 | Discharge: 2011-12-13 | Disposition: A | Discharge status: Home / Self Care | Payer: Self-pay

## 2011-12-15 ENCOUNTER — Emergency Department (HOSPITAL_COMMUNITY)
Admission: EM | Admit: 2011-12-15 | Discharge: 2011-12-15 | Disposition: A | Discharge status: Home / Self Care | Payer: Self-pay | Attending: Emergency Medicine | Admitting: Emergency Medicine

## 2011-12-15 ENCOUNTER — Emergency Department (HOSPITAL_COMMUNITY): Payer: Self-pay

## 2011-12-15 ENCOUNTER — Encounter (HOSPITAL_COMMUNITY): Payer: Self-pay

## 2011-12-15 DIAGNOSIS — R109 Unspecified abdominal pain: Secondary | ICD-10-CM | POA: Insufficient documentation

## 2011-12-15 DIAGNOSIS — F411 Generalized anxiety disorder: Secondary | ICD-10-CM | POA: Insufficient documentation

## 2011-12-15 DIAGNOSIS — Z880 Allergy status to penicillin: Secondary | ICD-10-CM | POA: Insufficient documentation

## 2011-12-15 DIAGNOSIS — Z885 Allergy status to narcotic agent status: Secondary | ICD-10-CM | POA: Insufficient documentation

## 2011-12-15 DIAGNOSIS — Z8543 Personal history of malignant neoplasm of ovary: Secondary | ICD-10-CM | POA: Insufficient documentation

## 2011-12-15 DIAGNOSIS — F17200 Nicotine dependence, unspecified, uncomplicated: Secondary | ICD-10-CM | POA: Insufficient documentation

## 2011-12-15 DIAGNOSIS — Z888 Allergy status to other drugs, medicaments and biological substances status: Secondary | ICD-10-CM | POA: Insufficient documentation

## 2011-12-15 DIAGNOSIS — R7401 Elevation of levels of liver transaminase levels: Secondary | ICD-10-CM | POA: Insufficient documentation

## 2011-12-15 DIAGNOSIS — R7402 Elevation of levels of lactic acid dehydrogenase (LDH): Secondary | ICD-10-CM | POA: Insufficient documentation

## 2011-12-15 DIAGNOSIS — R748 Abnormal levels of other serum enzymes: Secondary | ICD-10-CM

## 2011-12-15 DIAGNOSIS — M4120 Other idiopathic scoliosis, site unspecified: Secondary | ICD-10-CM | POA: Insufficient documentation

## 2011-12-15 HISTORY — DX: Malignant neoplasm of unspecified ovary: C56.9

## 2011-12-15 LAB — COMPREHENSIVE METABOLIC PANEL
AST: 114 U/L — ABNORMAL HIGH (ref 0–37)
Albumin: 3.9 g/dL (ref 3.5–5.2)
Alkaline Phosphatase: 98 U/L (ref 39–117)
BUN: 11 mg/dL (ref 6–23)
Potassium: 5 mEq/L (ref 3.5–5.1)
Sodium: 140 mEq/L (ref 135–145)
Total Protein: 7.1 g/dL (ref 6.0–8.3)

## 2011-12-15 LAB — CBC WITH DIFFERENTIAL/PLATELET
Basophils Absolute: 0 10*3/uL (ref 0.0–0.1)
Basophils Relative: 0 % (ref 0–1)
Eosinophils Absolute: 0.2 10*3/uL (ref 0.0–0.7)
MCH: 31.5 pg (ref 26.0–34.0)
MCHC: 33.4 g/dL (ref 30.0–36.0)
Monocytes Relative: 7 % (ref 3–12)
Neutrophils Relative %: 63 % (ref 43–77)
Platelets: 290 10*3/uL (ref 150–400)
RDW: 11.9 % (ref 11.5–15.5)

## 2011-12-15 LAB — LIPASE, BLOOD: Lipase: 39 U/L (ref 11–59)

## 2011-12-15 LAB — URINALYSIS, ROUTINE W REFLEX MICROSCOPIC
Bilirubin Urine: NEGATIVE
Nitrite: NEGATIVE
Specific Gravity, Urine: 1.009 (ref 1.005–1.030)
pH: 6.5 (ref 5.0–8.0)

## 2011-12-15 LAB — URINE MICROSCOPIC-ADD ON

## 2011-12-15 MED ORDER — TRAMADOL HCL 50 MG PO TABS: 50.0000 mg | ORAL_TABLET | Freq: Four times a day (QID) | ORAL | Status: DC | PRN

## 2011-12-15 MED ORDER — HYDROMORPHONE HCL PF 1 MG/ML IJ SOLN
1.0000 mg | Freq: Once | INTRAMUSCULAR | Status: AC
  Administered 2011-12-15: 1 mg via INTRAVENOUS
  Filled 2011-12-15: qty 1

## 2011-12-15 MED ORDER — PANTOPRAZOLE SODIUM 40 MG IV SOLR
40.0000 mg | Freq: Once | INTRAVENOUS | Status: AC
  Administered 2011-12-15: 40 mg via INTRAVENOUS
  Filled 2011-12-15: qty 40

## 2011-12-15 MED ORDER — PROMETHAZINE HCL 25 MG PO TABS: 25.0000 mg | ORAL_TABLET | Freq: Four times a day (QID) | ORAL | Status: DC | PRN

## 2011-12-15 MED ORDER — PROMETHAZINE HCL 25 MG/ML IJ SOLN
25.0000 mg | Freq: Once | INTRAMUSCULAR | Status: AC
  Administered 2011-12-15: 25 mg via INTRAVENOUS
  Filled 2011-12-15: qty 1

## 2011-12-15 MED ORDER — FAMOTIDINE 20 MG PO TABS: 20.0000 mg | ORAL_TABLET | Freq: Two times a day (BID) | ORAL | Status: DC

## 2011-12-15 MED ORDER — SODIUM CHLORIDE 0.9 % IV BOLUS (SEPSIS)
1000.0000 mL | Freq: Once | INTRAVENOUS | Status: AC
  Administered 2011-12-15: 1000 mL via INTRAVENOUS

## 2011-12-15 NOTE — ED Provider Notes (Signed)
History     CSN: [id number]  Arrival date & time 12/15/11  1478   First MD Initiated Contact with Patient 12/15/11 1120      Chief Complaint  Patient presents with  . Flank Pain  . Hematemesis    (Consider location/radiation/quality/duration/timing/severity/associated sxs/prior treatment) HPI.... right flank pain for 2 weeks.   No dysuria, hematuria, fever.   history of elevated liver enzymes.   Claims no history of coffee ground emesis but this was not noted in the emergency department.   Patient has been eating well with no weight loss no  black stool.  Severity is mild to moderate. Nothing makes symptoms better or worse  Past Medical History  Diagnosis Date  . DJD (degenerative joint disease)   . Scoliosis   . Kidney stones   . Anxiety   . Cancer     ovarian  . Ovarian cancer dx'd 2005    Past Surgical History  Procedure Date  . Abdominal hysterectomy     Family History  Problem Relation Age of Onset  . Heart failure Mother   . Heart failure Father     History  Substance Use Topics  . Smoking status: Current Every Day Smoker -- 0.5 packs/day    Types: Cigarettes  . Smokeless tobacco: Never Used  . Alcohol Use: No     seldom    OB History    Grav Para Term Preterm Abortions TAB SAB Ect Mult Living                  Review of Systems  All other systems reviewed and are negative.    Allergies  Morphine and related; Penicillins; and Zofran  Home Medications   Current Outpatient Rx  Name Route Sig Dispense Refill  . ACETAMINOPHEN 500 MG PO TABS Oral Take 1,000 mg by mouth every 6 (six) hours as needed. For pain    . ADULT MULTIVITAMIN W/MINERALS CH Oral Take 1 tablet by mouth daily.    . OXYCODONE-ACETAMINOPHEN 5-325 MG PO TABS  1 to 2 tabs PO q6hrs  PRN for pain 6 tablet 0  . PROMETHAZINE HCL 25 MG PO TABS Oral Take 1 tablet (25 mg total) by mouth every 6 (six) hours as needed for nausea. 15 tablet 0  . TRAMADOL HCL 50 MG PO TABS Oral Take 1 tablet  (50 mg total) by mouth every 6 (six) hours as needed for pain. 20 tablet 0    BP 107/76  Pulse 93  Temp 98.8 F (37.1 C) (Oral)  Resp 20  SpO2 100%  LMP 03/10/2005  Physical Exam  Nursing note and vitals reviewed. Constitutional: She is oriented to person, place, and time. She appears well-developed and well-nourished.  HENT:  Head: Normocephalic and atraumatic.  Eyes: Conjunctivae normal and EOM are normal. Pupils are equal, round, and reactive to light.  Neck: Normal range of motion. Neck supple.  Cardiovascular: Normal rate, regular rhythm and normal heart sounds.   Pulmonary/Chest: Effort normal and breath sounds normal.  Abdominal: Soft. Bowel sounds are normal.  Genitourinary:       Minimal right flank tenderness  Musculoskeletal: Normal range of motion.  Neurological: She is alert and oriented to person, place, and time.  Skin: Skin is warm and dry.  Psychiatric: She has a normal mood and affect.    ED Course  Procedures (including critical care time)  Labs Reviewed  COMPREHENSIVE METABOLIC PANEL - Abnormal; Notable for the following:    AST 114 (*)  ALT 153 (*)     Total Bilirubin 0.2 (*)     All other components within normal limits  URINALYSIS, ROUTINE W REFLEX MICROSCOPIC - Abnormal; Notable for the following:    Hgb urine dipstick LARGE (*)     Leukocytes, UA MODERATE (*)     All other components within normal limits  URINE MICROSCOPIC-ADD ON - Abnormal; Notable for the following:    Bacteria, UA FEW (*)     All other components within normal limits  CBC WITH DIFFERENTIAL  LIPASE, BLOOD  PREGNANCY, URINE   Ct Abdomen Pelvis Wo Contrast  12/15/2011  *RADIOLOGY REPORT*  Clinical Data: Right flank pain and right lower quadrant pain for 2 weeks.  History of ovarian cancer.  CT ABDOMEN AND PELVIS WITHOUT CONTRAST (CT UROGRAM)  Technique:  Multidetector CT imaging was performed through the abdomen and pelvis to include the urinary tract.  Comparison:   Contrast enhanced CT 09/1993 1013  Findings:  No intrarenal or proximal ureteral calculi on either side.  No evidence of hydronephrosis or other secondary signs of upper urinary tract obstruction.  Within limits of unenhanced technique, normal appearing kidneys.  Again, within limits of unenhanced technique, remaining visualized upper abdomen unremarkable. Visualized extreme lung bases clear except for minimal dependent atelectasis.  No distal ureteral calculi on either side. Appendix identified and normal. Uterus surgically absent.  No adnexal masses.  Visualized colon and small bowel unremarkable. No free fluid. Congenital fusion L1 and L2.  Compared to priors, similar appearance is noted.  IMPRESSION: Unremarkable unenhanced CT of the abdomen and pelvis.   Original Report Authenticated By: Elsie Stain, M.D.    Dg Abd 1 View  12/15/2011  *RADIOLOGY REPORT*  Clinical Data: Nausea and vomiting, right flank pain  ABDOMEN - 1 VIEW  Comparison: None.  Findings: There is scattered large and small bowel gas without obstructive change.  Fecal material is seen throughout the colon. No abnormal mass or abnormal calcifications are noted.  The osseous structures are within normal limits.  IMPRESSION: No acute abnormality noted.   Original Report Authenticated By: Phillips Odor, M.D.      1. Rt flank pain   2. Elevated liver enzymes       MDM  Patient is nontoxic.  Her functions remain elevated but they have come down from previous levels..  Although urinalysis shows hemoglobin, CT scan shows no obvious stone.  Patient is hemodynamically stable at discharge. No acute abdomen. Discharge meds include Ultram and Phenergan.  She will get primary care follow       Donnetta Hutching, MD 12/19/11 [phone]

## 2011-12-15 NOTE — ED Notes (Signed)
Patient reports that she has been having right flank pain x 2 weeks and was seen in the ED and was told she elevated liver enzymes and hypertension. Patient reports that the right flank pain is worse and she is now having coffee ground emesis and dysuria and hesitancy.

## 2011-12-22 ENCOUNTER — Encounter (HOSPITAL_COMMUNITY): Payer: Self-pay | Admitting: Emergency Medicine

## 2011-12-22 ENCOUNTER — Emergency Department (HOSPITAL_COMMUNITY)
Admission: EM | Admit: 2011-12-22 | Discharge: 2011-12-22 | Disposition: A | Discharge status: Home / Self Care | Payer: Self-pay | Attending: Emergency Medicine | Admitting: Emergency Medicine

## 2011-12-22 DIAGNOSIS — R109 Unspecified abdominal pain: Secondary | ICD-10-CM | POA: Insufficient documentation

## 2011-12-22 DIAGNOSIS — G8929 Other chronic pain: Secondary | ICD-10-CM

## 2011-12-22 DIAGNOSIS — F17200 Nicotine dependence, unspecified, uncomplicated: Secondary | ICD-10-CM | POA: Insufficient documentation

## 2011-12-22 DIAGNOSIS — Z8543 Personal history of malignant neoplasm of ovary: Secondary | ICD-10-CM | POA: Insufficient documentation

## 2011-12-22 DIAGNOSIS — M4120 Other idiopathic scoliosis, site unspecified: Secondary | ICD-10-CM | POA: Insufficient documentation

## 2011-12-22 LAB — CBC WITH DIFFERENTIAL/PLATELET
Basophils Absolute: 0 10*3/uL (ref 0.0–0.1)
Basophils Relative: 0 % (ref 0–1)
Eosinophils Absolute: 0.1 10*3/uL (ref 0.0–0.7)
Eosinophils Relative: 2 % (ref 0–5)
MCH: 31.4 pg (ref 26.0–34.0)
MCV: 92.3 fL (ref 78.0–100.0)
Platelets: 293 10*3/uL (ref 150–400)
RDW: 11.9 % (ref 11.5–15.5)
WBC: 7.1 10*3/uL (ref 4.0–10.5)

## 2011-12-22 LAB — COMPREHENSIVE METABOLIC PANEL
ALT: 104 U/L — ABNORMAL HIGH (ref 0–35)
AST: 81 U/L — ABNORMAL HIGH (ref 0–37)
Calcium: 9.6 mg/dL (ref 8.4–10.5)
GFR calc Af Amer: >90 mL/min (ref >90)
Sodium: 137 mEq/L (ref 135–145)
Total Protein: 7.6 g/dL (ref 6.0–8.3)

## 2011-12-22 MED ORDER — SODIUM CHLORIDE 0.9 % IV BOLUS (SEPSIS)
1000.0000 mL | Freq: Once | INTRAVENOUS | Status: AC
  Administered 2011-12-22: 1000 mL via INTRAVENOUS

## 2011-12-22 NOTE — ED Provider Notes (Signed)
History     CSN: [id number]  Arrival date & time 12/22/11  1940   First MD Initiated Contact with Patient 12/22/11 2108      Chief Complaint  Patient presents with  . Fever  . multiple complaints     (Consider location/radiation/quality/duration/timing/severity/associated sxs/prior treatment) HPI Pt is having her chronic R flank pain with N/V. She ran out of the ultram she was prescribed yesterday. She has been seen multiple times in the past few months for the same complaint. She has had 20+ CT scans done over the past few years with no abnormal findings. She had a CT abd last week which was normal. She has had mildly elevated LFT's and has been encouraged to f/u with PMD. States she has an appointment to be seen in November. No fever chills, urinary symptoms. Pt has episodic burning pain radiating from stomach into chest.  Past Medical History  Diagnosis Date  . DJD (degenerative joint disease)   . Scoliosis   . Kidney stones   . Anxiety   . Cancer     ovarian  . Ovarian cancer dx'd 2005    Past Surgical History  Procedure Date  . Abdominal hysterectomy   . Tubal ligation     Family History  Problem Relation Age of Onset  . Heart failure Mother   . Heart failure Father     History  Substance Use Topics  . Smoking status: Current Every Day Smoker -- 0.5 packs/day    Types: Cigarettes  . Smokeless tobacco: Never Used  . Alcohol Use: No     seldom    OB History    Grav Para Term Preterm Abortions TAB SAB Ect Mult Living                  Review of Systems  Constitutional: Negative for fever and chills.  HENT: Negative for neck pain.   Respiratory: Negative for shortness of breath.   Cardiovascular: Positive for chest pain. Negative for palpitations and leg swelling.  Gastrointestinal: Positive for nausea, vomiting and abdominal pain. Negative for diarrhea and constipation.  Genitourinary: Positive for flank pain. Negative for dysuria, hematuria and pelvic  pain.  Musculoskeletal: Positive for back pain. Negative for myalgias and arthralgias.  Skin: Negative for pallor, rash and wound.  Neurological: Negative for dizziness, weakness, light-headedness, numbness and headaches.    Allergies  Morphine and related; Penicillins; and Zofran  Home Medications   Current Outpatient Rx  Name Route Sig Dispense Refill  . ACETAMINOPHEN 500 MG PO TABS Oral Take 1,000 mg by mouth every 6 (six) hours as needed. For pain    . FAMOTIDINE 20 MG PO TABS Oral Take 20 mg by mouth 2 (two) times daily.    . ADULT MULTIVITAMIN W/MINERALS CH Oral Take 1 tablet by mouth daily.    Marland Kitchen PROMETHAZINE HCL 25 MG PO TABS Oral Take 25 mg by mouth every 6 (six) hours as needed. Nausea    . TRAMADOL HCL 50 MG PO TABS Oral Take 50 mg by mouth every 6 (six) hours as needed. Pain      BP 101/60  Pulse 81  Temp 97.8 F (36.6 C) (Oral)  Resp 18  SpO2 99%  LMP 03/10/2005  Physical Exam  Nursing note and vitals reviewed. Constitutional: She is oriented to person, place, and time. She appears well-developed and well-nourished. No distress.  HENT:  Head: Normocephalic and atraumatic.  Mouth/Throat: Oropharynx is clear and moist.  Eyes: EOM are normal.  Pupils are equal, round, and reactive to light.  Neck: Normal range of motion. Neck supple.  Cardiovascular: Normal rate and regular rhythm.   Pulmonary/Chest: Effort normal and breath sounds normal. No respiratory distress. She has no wheezes. She has no rales. She exhibits no tenderness.  Abdominal: Soft. Bowel sounds are normal. She exhibits no distension and no mass. There is no tenderness. There is no rebound and no guarding.  Musculoskeletal: Normal range of motion. She exhibits no edema and no tenderness.  Neurological: She is alert and oriented to person, place, and time.       5/5 motor in all ext, sensation grossly intact  Skin: Skin is warm and dry. No rash noted. No erythema.    ED Course  Procedures (including  critical care time)  Labs Reviewed  COMPREHENSIVE METABOLIC PANEL - Abnormal; Notable for the following:    Potassium 3.4 (*)     AST 81 (*)     ALT 104 (*)     All other components within normal limits  CBC WITH DIFFERENTIAL  URINALYSIS, ROUTINE W REFLEX MICROSCOPIC   No results found.   1. Chronic flank pain       MDM  Suspect pt of drug seeking behavior. Will repeat labs and d/c home to f/u with PMD. Pt burning pain consistent with stomach contents in esophagus. Pt does not have risk factors for CAD and symptoms are not consistent  Pt informed she would not be receiving IV narcotics and she wants to leave AMA. Her liver enzymes continue to improve. No vomiting noted in ED.       Loren Racer, MD 12/22/11 2154

## 2011-12-22 NOTE — ED Notes (Signed)
Pt states she was seen here on Sept 24th and was told she had elevated liver enzymes  Pt returned on Oct 7th for pain in her right side and was told she had a kidney stone and her enzymes remained elevated  Pt states for the past two days she has been sick on her stomach  Pt states yesterday morning she vomited once and had some diarrhea  Pt states today she has vomited 6 times and it had blood and old stuff that is brown in color  Pt states she had an episode of sweating profusely  Pt states she is having burning in her chest and stomach  Pt states she is having pain in her right side

## 2011-12-22 NOTE — ED Notes (Signed)
RN to obtain labs with start of IV 

## 2011-12-22 NOTE — ED Notes (Signed)
Left dept AMA, charge nurses notified of same. Dr. Ranae Palms notified of same. Pt has 20+ CTs and ran out of pain meds yesterday.

## 2011-12-23 ENCOUNTER — Emergency Department (HOSPITAL_COMMUNITY)
Admission: EM | Admit: 2011-12-23 | Discharge: 2011-12-23 | Disposition: A | Discharge status: Home / Self Care | Payer: Self-pay | Attending: Emergency Medicine | Admitting: Emergency Medicine

## 2011-12-23 ENCOUNTER — Encounter (HOSPITAL_COMMUNITY): Payer: Self-pay | Admitting: Emergency Medicine

## 2011-12-23 DIAGNOSIS — F411 Generalized anxiety disorder: Secondary | ICD-10-CM | POA: Insufficient documentation

## 2011-12-23 DIAGNOSIS — Z888 Allergy status to other drugs, medicaments and biological substances status: Secondary | ICD-10-CM | POA: Insufficient documentation

## 2011-12-23 DIAGNOSIS — G8929 Other chronic pain: Secondary | ICD-10-CM | POA: Insufficient documentation

## 2011-12-23 DIAGNOSIS — Z8543 Personal history of malignant neoplasm of ovary: Secondary | ICD-10-CM | POA: Insufficient documentation

## 2011-12-23 DIAGNOSIS — R109 Unspecified abdominal pain: Secondary | ICD-10-CM | POA: Insufficient documentation

## 2011-12-23 DIAGNOSIS — Z79899 Other long term (current) drug therapy: Secondary | ICD-10-CM | POA: Insufficient documentation

## 2011-12-23 DIAGNOSIS — F17200 Nicotine dependence, unspecified, uncomplicated: Secondary | ICD-10-CM | POA: Insufficient documentation

## 2011-12-23 DIAGNOSIS — N390 Urinary tract infection, site not specified: Secondary | ICD-10-CM | POA: Insufficient documentation

## 2011-12-23 LAB — CBC WITH DIFFERENTIAL/PLATELET
Basophils Relative: 0 % (ref 0–1)
HCT: 44.3 % (ref 36.0–46.0)
Hemoglobin: 14.9 g/dL (ref 12.0–15.0)
Lymphs Abs: 2.6 10*3/uL (ref 0.7–4.0)
MCH: 31.6 pg (ref 26.0–34.0)
MCHC: 33.6 g/dL (ref 30.0–36.0)
Monocytes Absolute: 0.7 10*3/uL (ref 0.1–1.0)
Monocytes Relative: 11 % (ref 3–12)
Neutro Abs: 3 10*3/uL (ref 1.7–7.7)

## 2011-12-23 LAB — BASIC METABOLIC PANEL
BUN: 13 mg/dL (ref 6–23)
Chloride: 106 mEq/L (ref 96–112)
Creatinine, Ser: 0.64 mg/dL (ref 0.50–1.10)
GFR calc Af Amer: >90 mL/min (ref >90)
Glucose, Bld: 86 mg/dL (ref 70–99)

## 2011-12-23 LAB — URINALYSIS, ROUTINE W REFLEX MICROSCOPIC
Bilirubin Urine: NEGATIVE
Glucose, UA: NEGATIVE mg/dL
Ketones, ur: NEGATIVE mg/dL
Nitrite: POSITIVE — AB
pH: 5.5 (ref 5.0–8.0)

## 2011-12-23 LAB — URINE MICROSCOPIC-ADD ON

## 2011-12-23 MED ORDER — TRAMADOL HCL 50 MG PO TABS: 50.0000 mg | ORAL_TABLET | Freq: Four times a day (QID) | ORAL | Status: DC | PRN

## 2011-12-23 MED ORDER — KETOROLAC TROMETHAMINE 60 MG/2ML IM SOLN
60.0000 mg | Freq: Once | INTRAMUSCULAR | Status: AC
  Administered 2011-12-23: 60 mg via INTRAMUSCULAR
  Filled 2011-12-23: qty 2

## 2011-12-23 MED ORDER — CIPROFLOXACIN HCL 500 MG PO TABS: 500.0000 mg | ORAL_TABLET | Freq: Two times a day (BID) | ORAL | Status: DC

## 2011-12-23 MED ORDER — PHENAZOPYRIDINE HCL 200 MG PO TABS: 200.0000 mg | ORAL_TABLET | Freq: Three times a day (TID) | ORAL | Status: DC

## 2011-12-23 NOTE — ED Notes (Signed)
Pt c/o generalized abd pain with N/V x 3 days with some coffee ground color emesis; pt sts sweats and cough and right flank pain

## 2011-12-23 NOTE — ED Provider Notes (Signed)
History     CSN: [id number]  Arrival date & time 12/23/11  1643   First MD Initiated Contact with Patient 12/23/11 2004      Chief Complaint  Patient presents with  . Abdominal Pain  . Emesis  . Cough    (Consider location/radiation/quality/duration/timing/severity/associated sxs/prior treatment) HPI Comments: Patient with longstanding history of chronic abd pain.  She was seen yesterday at [facility] but was not given pain medications and signed out ama.  She has a history of frequent ed visits for the same.  Her lft's have been elevated in the past.  She is due to see a physician at an outside facility in the near future.  Patient is a 41 y.o. female presenting with abdominal pain. The history is provided by the patient.  Abdominal Pain The primary symptoms of the illness include nausea. The primary symptoms of the illness do not include dysuria. Episode onset: 3 days. The onset of the illness was gradual. The problem has been gradually worsening.  The patient states that she believes she is currently not pregnant. The patient has not had a change in bowel habit. Symptoms associated with the illness do not include constipation, urgency, frequency or back pain.    Past Medical History  Diagnosis Date  . DJD (degenerative joint disease)   . Scoliosis   . Kidney stones   . Anxiety   . Cancer     ovarian  . Ovarian cancer dx'd 2005    Past Surgical History  Procedure Date  . Abdominal hysterectomy   . Tubal ligation     Family History  Problem Relation Age of Onset  . Heart failure Mother   . Heart failure Father     History  Substance Use Topics  . Smoking status: Current Every Day Smoker -- 0.5 packs/day    Types: Cigarettes  . Smokeless tobacco: Never Used  . Alcohol Use: No     seldom    OB History    Grav Para Term Preterm Abortions TAB SAB Ect Mult Living                  Review of Systems  Gastrointestinal: Positive for nausea. Negative for constipation.    Genitourinary: Negative for dysuria, urgency and frequency.  Musculoskeletal: Negative for back pain.  All other systems reviewed and are negative.    Allergies  Morphine and related; Penicillins; and Zofran  Home Medications   Current Outpatient Rx  Name Route Sig Dispense Refill  . ACETAMINOPHEN 500 MG PO TABS Oral Take 1,000 mg by mouth every 6 (six) hours as needed. For pain    . FAMOTIDINE 20 MG PO TABS Oral Take 20 mg by mouth 2 (two) times daily.    Marland Kitchen PROMETHAZINE HCL 25 MG PO TABS Oral Take 25 mg by mouth every 6 (six) hours as needed. Nausea    . TRAMADOL HCL 50 MG PO TABS Oral Take 50 mg by mouth every 6 (six) hours as needed. Pain      BP 104/79  Pulse 70  Temp 98.5 F (36.9 C) (Oral)  Resp 16  SpO2 99%  LMP 03/10/2005  Physical Exam  Nursing note and vitals reviewed. Constitutional: She is oriented to person, place, and time. She appears well-developed and well-nourished. No distress.  HENT:  Head: Normocephalic and atraumatic.  Neck: Normal range of motion. Neck supple.  Cardiovascular: Normal rate and regular rhythm.  Exam reveals no gallop and no friction rub.   No  murmur heard. Pulmonary/Chest: Effort normal and breath sounds normal. No respiratory distress. She has no wheezes.  Abdominal: Soft. Bowel sounds are normal.       There is ttp in all four quadrants and both flanks without rebound or guarding.  Bowel sounds are normoactive.  Musculoskeletal: Normal range of motion.  Neurological: She is alert and oriented to person, place, and time.  Skin: Skin is warm and dry. She is not diaphoretic.    ED Course  Procedures (including critical care time)  Labs Reviewed  URINALYSIS, ROUTINE W REFLEX MICROSCOPIC - Abnormal; Notable for the following:    APPearance CLOUDY (*)     Hgb urine dipstick TRACE (*)     Nitrite POSITIVE (*)     Leukocytes, UA LARGE (*)     All other components within normal limits  URINE MICROSCOPIC-ADD ON - Abnormal; Notable  for the following:    Squamous Epithelial / LPF FEW (*)     Bacteria, UA MANY (*)     All other components within normal limits  CBC WITH DIFFERENTIAL  BASIC METABOLIC PANEL   No results found.   No diagnosis found.    MDM  Patient with history of chronic abd pain, mulitple ed visits, and 20+ ct scans over the past several years.  She comes in tonight with a flareup of the same.  Upon hearing her complaints and reviewing the chart, I strongly suspect that she is drug seeking.  The labs look okay but the ua does reveal a uti.  She will be treated with cipro, pyridium and tramadol.  I firmly counseled her that she needs to obtain a pcp to help her with these issues and that the er is not the place to come for chronic pain.          Geoffery Lyons, MD 12/23/11 2020

## 2011-12-23 NOTE — ED Notes (Signed)
Pt c/o of lower abdominal pain and right flank pain. Was seen at [facility] in sept and told liver enzymes were elevated. Made an apt for nov to follow up. Has been vomiting up brown and what looks like blood the last few times. Also reports black in stool like coffee grounds. Also reporting night sweats. Has taken tylenol and tramadol at home with little relief.

## 2012-01-09 ENCOUNTER — Encounter (HOSPITAL_COMMUNITY): Payer: Self-pay | Admitting: Emergency Medicine

## 2012-01-09 ENCOUNTER — Emergency Department (HOSPITAL_COMMUNITY)
Admission: EM | Admit: 2012-01-09 | Discharge: 2012-01-09 | Disposition: A | Discharge status: Home / Self Care | Payer: Self-pay | Attending: Emergency Medicine | Admitting: Emergency Medicine

## 2012-01-09 DIAGNOSIS — K219 Gastro-esophageal reflux disease without esophagitis: Secondary | ICD-10-CM | POA: Insufficient documentation

## 2012-01-09 DIAGNOSIS — M549 Dorsalgia, unspecified: Secondary | ICD-10-CM | POA: Insufficient documentation

## 2012-01-09 DIAGNOSIS — N390 Urinary tract infection, site not specified: Secondary | ICD-10-CM | POA: Insufficient documentation

## 2012-01-09 DIAGNOSIS — Z79899 Other long term (current) drug therapy: Secondary | ICD-10-CM | POA: Insufficient documentation

## 2012-01-09 DIAGNOSIS — M1990 Unspecified osteoarthritis, unspecified site: Secondary | ICD-10-CM | POA: Insufficient documentation

## 2012-01-09 DIAGNOSIS — Z8543 Personal history of malignant neoplasm of ovary: Secondary | ICD-10-CM | POA: Insufficient documentation

## 2012-01-09 DIAGNOSIS — R112 Nausea with vomiting, unspecified: Secondary | ICD-10-CM | POA: Insufficient documentation

## 2012-01-09 DIAGNOSIS — F411 Generalized anxiety disorder: Secondary | ICD-10-CM | POA: Insufficient documentation

## 2012-01-09 DIAGNOSIS — M4120 Other idiopathic scoliosis, site unspecified: Secondary | ICD-10-CM | POA: Insufficient documentation

## 2012-01-09 DIAGNOSIS — R509 Fever, unspecified: Secondary | ICD-10-CM | POA: Insufficient documentation

## 2012-01-09 DIAGNOSIS — F17200 Nicotine dependence, unspecified, uncomplicated: Secondary | ICD-10-CM | POA: Insufficient documentation

## 2012-01-09 DIAGNOSIS — Z87442 Personal history of urinary calculi: Secondary | ICD-10-CM | POA: Insufficient documentation

## 2012-01-09 DIAGNOSIS — Z8719 Personal history of other diseases of the digestive system: Secondary | ICD-10-CM | POA: Insufficient documentation

## 2012-01-09 DIAGNOSIS — R197 Diarrhea, unspecified: Secondary | ICD-10-CM | POA: Insufficient documentation

## 2012-01-09 LAB — URINE MICROSCOPIC-ADD ON

## 2012-01-09 LAB — POCT PREGNANCY, URINE: Preg Test, Ur: NEGATIVE

## 2012-01-09 LAB — URINALYSIS, ROUTINE W REFLEX MICROSCOPIC
Bilirubin Urine: NEGATIVE
Glucose, UA: NEGATIVE mg/dL
Protein, ur: NEGATIVE mg/dL
Urobilinogen, UA: 0.2 mg/dL (ref 0.0–1.0)

## 2012-01-09 MED ORDER — FAMOTIDINE IN NACL 20-0.9 MG/50ML-% IV SOLN
20.0000 mg | Freq: Once | INTRAVENOUS | Status: AC
  Administered 2012-01-09: 20 mg via INTRAVENOUS
  Filled 2012-01-09: qty 50

## 2012-01-09 MED ORDER — ONDANSETRON 8 MG PO TBDP
8.0000 mg | ORAL_TABLET | Freq: Once | ORAL | Status: DC
  Filled 2012-01-09: qty 1

## 2012-01-09 MED ORDER — NAPROXEN 500 MG PO TABS: 500.0000 mg | ORAL_TABLET | Freq: Two times a day (BID) | ORAL | Status: DC

## 2012-01-09 NOTE — ED Notes (Signed)
NP at bedside.

## 2012-01-09 NOTE — ED Notes (Signed)
Pt states she is having pain on the left side of her back and left lower abdomen  Pt states she has had vomiting today  Pt states she has hx of kidney stones  Pt states she also has a headache   Pt states sxs started a few hours ago

## 2012-01-09 NOTE — ED Provider Notes (Signed)
History     CSN: [id number]  Arrival date & time 01/09/12  4098   First MD Initiated Contact with Patient 01/09/12 0341      Chief Complaint  Patient presents with  . Abdominal Pain    (Consider location/radiation/quality/duration/timing/severity/associated sxs/prior treatment) Patient is a 41 y.o. female presenting with abdominal pain. The history is provided by the patient. No language interpreter was used.  Abdominal Pain The primary symptoms of the illness include abdominal pain, fever, nausea, vomiting and diarrhea. The primary symptoms of the illness do not include shortness of breath, dysuria, vaginal discharge or vaginal bleeding. The current episode started 6 to 12 hours ago. The onset of the illness was gradual. The problem has been gradually worsening.  Additional symptoms associated with the illness include back pain. Symptoms associated with the illness do not include chills, constipation, urgency, hematuria or frequency. Significant associated medical issues include GERD and diverticulitis. Significant associated medical issues do not include PUD, inflammatory bowel disease, diabetes or substance abuse.   41 year old female with frequent ER visits well-known to this ER. C/o of L LQ/flank pain since yesterday am with nausea and vomiting x 9.  States that she has also had a fever 101 and 102 yesterday and L flank pain with pmh of kidney stone.  States she has taken ibuprofen and tylenol with no relief.  Patient is a frequent flyer to the ER with charted drug  seeking behavior multipele times.  She has had Multiple CT scans and u/s of her abdomen and various other body parts. No acute distress. VSS.   Past Medical History  Diagnosis Date  . DJD (degenerative joint disease)   . Scoliosis   . Kidney stones   . Anxiety   . Cancer     ovarian  . Ovarian cancer dx'd 2005    Past Surgical History  Procedure Date  . Abdominal hysterectomy   . Tubal ligation     Family  History  Problem Relation Age of Onset  . Heart failure Mother   . Heart failure Father     History  Substance Use Topics  . Smoking status: Current Every Day Smoker -- 0.5 packs/day    Types: Cigarettes  . Smokeless tobacco: Never Used  . Alcohol Use: No     seldom    OB History    Grav Para Term Preterm Abortions TAB SAB Ect Mult Living                  Review of Systems  Constitutional: Positive for fever. Negative for chills.  HENT: Negative.   Eyes: Negative.   Respiratory: Negative.  Negative for shortness of breath.   Cardiovascular: Negative.   Gastrointestinal: Positive for nausea, vomiting, abdominal pain and diarrhea. Negative for constipation.  Genitourinary: Negative for dysuria, urgency, frequency, hematuria, vaginal bleeding and vaginal discharge.  Musculoskeletal: Positive for back pain. Negative for gait problem.  Neurological: Negative.  Negative for dizziness, syncope, facial asymmetry, weakness and light-headedness.  Psychiatric/Behavioral: Negative.   All other systems reviewed and are negative.    Allergies  Morphine and related; Penicillins; and Zofran  Home Medications   Current Outpatient Rx  Name Route Sig Dispense Refill  . ACETAMINOPHEN 500 MG PO TABS Oral Take 1,000 mg by mouth every 6 (six) hours as needed. For pain    . FAMOTIDINE 20 MG PO TABS Oral Take 20 mg by mouth 2 (two) times daily.    Marland Kitchen PHENAZOPYRIDINE HCL 200 MG PO TABS Oral  Take 1 tablet (200 mg total) by mouth 3 (three) times daily. 6 tablet 0  . TRAMADOL HCL 50 MG PO TABS Oral Take 1 tablet (50 mg total) by mouth every 6 (six) hours as needed for pain. 12 tablet 0    BP 118/82  Pulse 84  Temp 98.1 F (36.7 C) (Oral)  Resp 17  SpO2 100%  LMP 03/10/2005  Physical Exam  Nursing note and vitals reviewed. Constitutional: She is oriented to person, place, and time. She appears well-developed and well-nourished.  HENT:  Head: Normocephalic and atraumatic.  Eyes:  Conjunctivae normal and EOM are normal. Pupils are equal, round, and reactive to light.  Neck: Normal range of motion. Neck supple.  Cardiovascular: Normal rate.   Pulmonary/Chest: Effort normal.  Abdominal: Soft. She exhibits no distension. There is tenderness.       General abdominal tenderness L groin and L flank pain  Musculoskeletal: Normal range of motion. She exhibits no edema and no tenderness.  Neurological: She is alert and oriented to person, place, and time. She has normal reflexes. No cranial nerve deficit.  Skin: Skin is warm and dry.  Psychiatric: She has a normal mood and affect.    ED Course  Procedures (including critical care time) Patient has had multiple CT scans and ultra sounds in the past that show no cause of pain.  Patient is aggressive and wanting narcotic pain meds.   Labs Reviewed  URINALYSIS, ROUTINE W REFLEX MICROSCOPIC   No results found.   No diagnosis found.    MDM  41yo female with multiple visits Suspect drug seeking behavior.   Treated for UTI recently with11-20 wbc in urine today.  Will change antibiotic to bactrim.  Naproxyn for pain.  No further vomiting in ER.  IVF, pepcid and zofran in the ER.  Will get apcp to follow up with asap. Understands to return for worsening symptoms.         Remi Haggard, NP 01/09/12 1933

## 2012-01-09 NOTE — ED Notes (Signed)
Pt states she was also running a fever earlier today

## 2012-01-09 NOTE — ED Notes (Addendum)
Pt wanted to leave without vitals being taken. RN notified.

## 2012-01-10 LAB — URINE CULTURE: Colony Count: NO GROWTH

## 2012-01-10 NOTE — ED Provider Notes (Signed)
Medical screening examination/treatment/procedure(s) were performed by non-physician practitioner and as supervising physician I was immediately available for consultation/collaboration.    Vida Roller, MD 01/10/12 [phone]

## 2012-01-29 ENCOUNTER — Emergency Department (HOSPITAL_COMMUNITY)
Admission: EM | Admit: 2012-01-29 | Discharge: 2012-01-29 | Discharge status: Against Medical Advice | Payer: Self-pay | Attending: Emergency Medicine | Admitting: Emergency Medicine

## 2012-01-29 ENCOUNTER — Encounter (HOSPITAL_COMMUNITY): Payer: Self-pay | Admitting: Family Medicine

## 2012-01-29 DIAGNOSIS — M549 Dorsalgia, unspecified: Secondary | ICD-10-CM | POA: Insufficient documentation

## 2012-01-29 DIAGNOSIS — IMO0002 Reserved for concepts with insufficient information to code with codable children: Secondary | ICD-10-CM | POA: Insufficient documentation

## 2012-01-29 DIAGNOSIS — C569 Malignant neoplasm of unspecified ovary: Secondary | ICD-10-CM | POA: Insufficient documentation

## 2012-01-29 DIAGNOSIS — Z87442 Personal history of urinary calculi: Secondary | ICD-10-CM | POA: Insufficient documentation

## 2012-01-29 DIAGNOSIS — F17200 Nicotine dependence, unspecified, uncomplicated: Secondary | ICD-10-CM | POA: Insufficient documentation

## 2012-01-29 DIAGNOSIS — Z765 Malingerer [conscious simulation]: Secondary | ICD-10-CM | POA: Insufficient documentation

## 2012-01-29 DIAGNOSIS — Z8659 Personal history of other mental and behavioral disorders: Secondary | ICD-10-CM | POA: Insufficient documentation

## 2012-01-29 DIAGNOSIS — F1910 Other psychoactive substance abuse, uncomplicated: Secondary | ICD-10-CM | POA: Insufficient documentation

## 2012-01-29 DIAGNOSIS — M4120 Other idiopathic scoliosis, site unspecified: Secondary | ICD-10-CM | POA: Insufficient documentation

## 2012-01-29 LAB — URINALYSIS, ROUTINE W REFLEX MICROSCOPIC
Glucose, UA: NEGATIVE mg/dL
Hgb urine dipstick: NEGATIVE
Leukocytes, UA: NEGATIVE
Protein, ur: NEGATIVE mg/dL
pH: 7.5 (ref 5.0–8.0)

## 2012-01-29 LAB — RAPID URINE DRUG SCREEN, HOSP PERFORMED
Amphetamines: NOT DETECTED
Benzodiazepines: POSITIVE — AB
Opiates: POSITIVE — AB
Tetrahydrocannabinol: POSITIVE — AB

## 2012-01-29 MED ORDER — PROMETHAZINE HCL 25 MG/ML IJ SOLN
12.5000 mg | Freq: Once | INTRAMUSCULAR | Status: AC
  Administered 2012-01-29: 12.5 mg via INTRAMUSCULAR
  Filled 2012-01-29: qty 1

## 2012-01-29 MED ORDER — KETOROLAC TROMETHAMINE 30 MG/ML IJ SOLN
60.0000 mg | Freq: Once | INTRAMUSCULAR | Status: DC
  Filled 2012-01-29: qty 2

## 2012-01-29 NOTE — ED Provider Notes (Signed)
History     CSN: [id number]  Arrival date & time 01/29/12  0127   First MD Initiated Contact with Patient 01/29/12 0252      Chief Complaint  Patient presents with  . Back Pain    (Consider location/radiation/quality/duration/timing/severity/associated sxs/prior treatment) HPI 41 year old female presents to the emergency department complaining of spine pain that travels from her lower neck down and wraps around her pelvis. She reports the pain has been severe at times, causing her to have nausea and vomiting. She has not tolerated any food or water today. She reports her last urination was at 6 PM. Patient reports history of kidney stones, thinks that she may be having another one due to the pain on both sides of her pelvis. She denies any fever, no diarrhea. Last bowel movement was Monday, which is unusual for her. She denies any incontinence, no leg weakness or numbness.  Past Medical History  Diagnosis Date  . DJD (degenerative joint disease)   . Scoliosis   . Kidney stones   . Anxiety   . Cancer     ovarian  . Ovarian cancer dx'd 2005    Past Surgical History  Procedure Date  . Abdominal hysterectomy   . Tubal ligation     Family History  Problem Relation Age of Onset  . Heart failure Mother   . Heart failure Father     History  Substance Use Topics  . Smoking status: Current Every Day Smoker -- 0.5 packs/day    Types: Cigarettes  . Smokeless tobacco: Never Used  . Alcohol Use: No     Comment: seldom    OB History    Grav Para Term Preterm Abortions TAB SAB Ect Mult Living                  Review of Systems  See History of Present Illness; otherwise all other systems are reviewed and negative  Allergies  Morphine and related; Penicillins; and Zofran  Home Medications   Current Outpatient Rx  Name  Route  Sig  Dispense  Refill  . ACETAMINOPHEN 500 MG PO TABS   Oral   Take 1,000 mg by mouth every 6 (six) hours as needed. For pain            BP 101/69  Pulse 95  Temp 98 F (36.7 C) (Oral)  Resp 18  SpO2 98%  LMP 03/10/2005  Physical Exam  Nursing note and vitals reviewed. Constitutional: She is oriented to person, place, and time. She appears distressed (uncomfortable-appearing.).       Patient appears older than stated age  HENT:  Head: Normocephalic and atraumatic.  Nose: Nose normal.  Mouth/Throat: Oropharynx is clear and moist. No oropharyngeal exudate.  Neck: Normal range of motion. Neck supple. No JVD present. No tracheal deviation present. No thyromegaly present.  Cardiovascular: Normal rate, regular rhythm, normal heart sounds and intact distal pulses.  Exam reveals no gallop and no friction rub.   No murmur heard. Pulmonary/Chest: Effort normal and breath sounds normal. No stridor. No respiratory distress. She has no wheezes. She has no rales. She exhibits no tenderness.  Abdominal: Soft. She exhibits no mass. There is tenderness (diffuse tenderness throughout the abdomen). There is no rebound and no guarding.  Musculoskeletal: Normal range of motion. She exhibits tenderness (tenderness with palpation along spine and paraspinal muscle). She exhibits no edema.       Patient with normal gait ambulatory from triage to room  Lymphadenopathy:  She has no cervical adenopathy.  Neurological: She is alert and oriented to person, place, and time. She has normal reflexes. She displays normal reflexes. No cranial nerve deficit. She exhibits normal muscle tone. Coordination normal.  Skin: Skin is warm and dry. No rash noted. She is not diaphoretic. No erythema. No pallor.  Psychiatric: Her behavior is normal. Thought content normal.       Flat affect    ED Course  Procedures (including critical care time)  Labs Reviewed  URINE RAPID DRUG SCREEN (HOSP PERFORMED) - Abnormal; Notable for the following:    Opiates POSITIVE (*)     Benzodiazepines POSITIVE (*)     Tetrahydrocannabinol POSITIVE (*)     All other  components within normal limits  URINALYSIS, ROUTINE W REFLEX MICROSCOPIC   No results found.   1. Back pain   2. Polysubstance abuse   3. Malingering       MDM  41 year old female with acute on chronic pain. Patient has had multiple visits in the emergency apartment in the past, she has exhibited drug seeking behavior in the past. Drug database pulled up, no prescriptions since September 24 noted, but noted that patient has multiple names and addresses and has had frequent narcotic prescriptions  The patient refused Toradol. She reported she was still vomiting despite the Phenergan. I explained that I was not comfortable treating her chronic pain with opiate medications. Patient left AMA soon afterwards. It is to be noted that there were no ketones seen on her urine so I doubt that she has not been able to eat or drink anything over the last 2-3 days. Her urine drug screen was positive for opiates benzos and THC. I feel that she is most likely malingering/drug seeking behavior.        Olivia Mackie, MD 01/29/12 [phone]

## 2012-01-29 NOTE — ED Notes (Signed)
Pt given some water and encouraged to provide a urine sample. Pt states, "I don't believe I will be able to give urine tonight."  Pt also requesting to speak with MD. MD made aware.

## 2012-01-29 NOTE — ED Notes (Signed)
Pt observed ambulating to room with no trouble.

## 2012-01-29 NOTE — ED Notes (Signed)
Pt reports not been able to pee since 6pm.  Pt told of need to obtain a urine sample and the need of an in and out cath if pt is unable to provide urine.  Pt understood.

## 2012-01-29 NOTE — ED Notes (Signed)
Pt is unable to void at this time.  

## 2012-01-29 NOTE — ED Notes (Signed)
Patient states she has had pain in her spine since Monday. States pain has moved into her legs, sides and abdomen. States pain is keeping her from sleeping. States she has a history of kidney stones; last urinated @ 6pm.

## 2012-01-29 NOTE — ED Notes (Signed)
Pt observed walking out of the ED. Dr. Norlene Campbell made aware.

## 2012-01-29 NOTE — ED Notes (Signed)
Pt reports not being able to keep food down and pt reports that the pain has had an affect on her sleep.  Pt reports sleeping about 5 hours in the past 2 days.

## 2012-02-15 DIAGNOSIS — M549 Dorsalgia, unspecified: Secondary | ICD-10-CM | POA: Insufficient documentation

## 2012-02-15 DIAGNOSIS — R112 Nausea with vomiting, unspecified: Secondary | ICD-10-CM | POA: Insufficient documentation

## 2012-02-15 DIAGNOSIS — M4120 Other idiopathic scoliosis, site unspecified: Secondary | ICD-10-CM | POA: Insufficient documentation

## 2012-02-15 DIAGNOSIS — G8929 Other chronic pain: Secondary | ICD-10-CM | POA: Insufficient documentation

## 2012-02-15 DIAGNOSIS — Z87442 Personal history of urinary calculi: Secondary | ICD-10-CM | POA: Insufficient documentation

## 2012-02-15 DIAGNOSIS — Z8543 Personal history of malignant neoplasm of ovary: Secondary | ICD-10-CM | POA: Insufficient documentation

## 2012-02-15 DIAGNOSIS — Z8659 Personal history of other mental and behavioral disorders: Secondary | ICD-10-CM | POA: Insufficient documentation

## 2012-02-15 DIAGNOSIS — F17200 Nicotine dependence, unspecified, uncomplicated: Secondary | ICD-10-CM | POA: Insufficient documentation

## 2012-02-15 DIAGNOSIS — R5381 Other malaise: Secondary | ICD-10-CM | POA: Insufficient documentation

## 2012-02-15 DIAGNOSIS — R6883 Chills (without fever): Secondary | ICD-10-CM | POA: Insufficient documentation

## 2012-02-15 DIAGNOSIS — R109 Unspecified abdominal pain: Secondary | ICD-10-CM | POA: Insufficient documentation

## 2012-02-15 DIAGNOSIS — M1990 Unspecified osteoarthritis, unspecified site: Secondary | ICD-10-CM | POA: Insufficient documentation

## 2012-02-15 DIAGNOSIS — R269 Unspecified abnormalities of gait and mobility: Secondary | ICD-10-CM | POA: Insufficient documentation

## 2012-02-15 DIAGNOSIS — R51 Headache: Secondary | ICD-10-CM | POA: Insufficient documentation

## 2012-02-15 DIAGNOSIS — R42 Dizziness and giddiness: Secondary | ICD-10-CM | POA: Insufficient documentation

## 2012-02-16 ENCOUNTER — Emergency Department (HOSPITAL_COMMUNITY)
Admission: EM | Admit: 2012-02-16 | Discharge: 2012-02-16 | Disposition: A | Discharge status: Home / Self Care | Payer: Self-pay | Attending: Emergency Medicine | Admitting: Emergency Medicine

## 2012-02-16 ENCOUNTER — Encounter (HOSPITAL_COMMUNITY): Payer: Self-pay | Admitting: Emergency Medicine

## 2012-02-16 DIAGNOSIS — R6889 Other general symptoms and signs: Secondary | ICD-10-CM

## 2012-02-16 DIAGNOSIS — Z765 Malingerer [conscious simulation]: Secondary | ICD-10-CM

## 2012-02-16 DIAGNOSIS — G8929 Other chronic pain: Secondary | ICD-10-CM

## 2012-02-16 MED ORDER — IBUPROFEN 200 MG PO TABS
600.0000 mg | ORAL_TABLET | Freq: Once | ORAL | Status: AC
  Administered 2012-02-16: 600 mg via ORAL
  Filled 2012-02-16: qty 3

## 2012-02-16 MED ORDER — PROMETHAZINE HCL 25 MG PO TABS
12.5000 mg | ORAL_TABLET | Freq: Once | ORAL | Status: AC
  Administered 2012-02-16: 12.5 mg via ORAL
  Filled 2012-02-16: qty 1

## 2012-02-16 NOTE — ED Provider Notes (Signed)
History     CSN: [id number]  Arrival date & time 02/15/12  2351   First MD Initiated Contact with Patient 02/16/12 0028      Chief Complaint  Patient presents with  . Numbness    (Consider location/radiation/quality/duration/timing/severity/associated sxs/prior treatment) HPI Courtney [name] is a 41 y.o. female who presents complaining of numbness to legs, burning to the thighs, nausea, vomiting, back pain that "shoots up and down my spine," states has burning in her throat, burning in the abdomen, states feels weak. Pt states symptoms started several days ago. Took tylenol PM and pepto with no relief. States cant keep anything down. Denies fever, admits to chills, no chest pain, no loss of bladder or bowel function. No saddle paresthesia. No injuries.    Past Medical History  Diagnosis Date  . DJD (degenerative joint disease)   . Scoliosis   . Kidney stones   . Anxiety   . Cancer     ovarian  . Ovarian cancer dx'd 2005    Past Surgical History  Procedure Date  . Abdominal hysterectomy   . Tubal ligation     Family History  Problem Relation Age of Onset  . Heart failure Mother   . Heart failure Father     History  Substance Use Topics  . Smoking status: Current Every Day Smoker -- 0.5 packs/day    Types: Cigarettes  . Smokeless tobacco: Never Used  . Alcohol Use: No     Comment: seldom    OB History    Grav Para Term Preterm Abortions TAB SAB Ect Mult Living                  Review of Systems  Constitutional: Positive for chills and fatigue. Negative for fever.  HENT: Negative for neck pain and neck stiffness.   Respiratory: Negative.   Cardiovascular: Negative.   Gastrointestinal: Positive for nausea, vomiting and abdominal pain.  Genitourinary: Negative for dysuria and flank pain.  Musculoskeletal: Positive for back pain, arthralgias and gait problem.  Skin: Negative.   Neurological: Positive for dizziness, weakness, numbness and headaches.  All  other systems reviewed and are negative.    Allergies  Morphine and related; Penicillins; and Zofran  Home Medications   Current Outpatient Rx  Name  Route  Sig  Dispense  Refill  . ACETAMINOPHEN 500 MG PO TABS   Oral   Take 1,000 mg by mouth every 6 (six) hours as needed. For pain           BP 117/79  Pulse 89  Temp 97.9 F (36.6 C) (Oral)  Resp 20  SpO2 99%  LMP 03/10/2005  Physical Exam  Nursing note and vitals reviewed. Constitutional: She is oriented to person, place, and time. She appears well-developed and well-nourished.       Moaning, riding around appearing in pain  HENT:  Head: Normocephalic.  Eyes: Conjunctivae normal are normal.  Neck: Normal range of motion. Neck supple.  Cardiovascular: Normal rate, regular rhythm and normal heart sounds.   Pulmonary/Chest: Effort normal and breath sounds normal. No respiratory distress. She has no wheezes. She has no rales.  Abdominal: Soft. Bowel sounds are normal. She exhibits no distension. There is tenderness. There is no rebound and no guarding.       Diffuse tenderness  Musculoskeletal:       Normal appearing legs. No erythema, rash. Sensation intact to the soles of the feet, dorsum of feet, lower and upper legs. patellear reflexes  2+ bilaterally. Pain with bilateral rom at the hip joint, straight and flexed. Pt out of proportion tender over entire spine and paraspinal area, where i barelly just touched the skin she started screaming. No rash noted. 5/5 and equal strength with bilateral foot dorsiflexion and plantar flexion.   Neurological: She is alert and oriented to person, place, and time.  Skin: Skin is warm and dry.  Psychiatric:       Anxious, crying    ED Course  Procedures (including critical care time)    1. Multiple complaints   2. Chronic pain   3. Malingering       MDM  Pt here with multiple complaints, some of which make no sense like "my thighs are in pain down to my knee but everything  under my knees is numb." this does not follow any dermatomal disribution. i do not see any signs of infection, swelling, injury to explain these symptoms. Doubt neuropathy.  Pt has normal neurological exam, including reflexes, strength. I did see her walk, and she has a normal gait with no foot drop. She is not vomiting in ED. VS normal she is afebrile. No red flags suggesting cauda equina.   Filed Vitals:   02/16/12 0027  BP: 117/79  Pulse: 89  Temp: 97.9 F (36.6 C)  Resp: 20     *Pt signed in under a different name, and she used someone else's social security number. Caught when was not able to give a correct birthday with the name provided.  I asked her why this happened that they had her signed in under wrong name, she said "i was told you wouldn't see me if i didn't have insurance, so i used someone else name." I question this statement since pt has been SEEN in ED multiple times prior to this under this name and was treated. I do not see an emergent condition on pt's exam. i gave her pherngan PO for nausea. Ibuprofen for pain. D/c home with follow up.         Lottie Mussel, [place] 02/16/12 [id number]

## 2012-02-16 NOTE — ED Provider Notes (Signed)
Medical screening examination/treatment/procedure(s) were performed by non-physician practitioner and as supervising physician I was immediately available for consultation/collaboration.   Shaylynn Nulty L Khalee Mazo, MD 02/16/12 0640 

## 2012-02-16 NOTE — ED Notes (Signed)
Pt states she was seen here last week for same and was given runs of potassium and fluids  Pt states tonight her legs from her knees down are numb but from her knees up they are painful  Pt states she has been vomiting since 4pm and is having burning in her throat and abdomen   Pt states she is having pain in her back specifies it is her spine

## 2012-04-15 ENCOUNTER — Emergency Department (HOSPITAL_COMMUNITY)
Admission: EM | Admit: 2012-04-15 | Discharge: 2012-04-16 | Discharge status: Against Medical Advice | Payer: Self-pay | Attending: Emergency Medicine | Admitting: Emergency Medicine

## 2012-04-15 ENCOUNTER — Encounter (HOSPITAL_COMMUNITY): Payer: Self-pay | Admitting: *Deleted

## 2012-04-15 DIAGNOSIS — Z9851 Tubal ligation status: Secondary | ICD-10-CM | POA: Insufficient documentation

## 2012-04-15 DIAGNOSIS — Z8739 Personal history of other diseases of the musculoskeletal system and connective tissue: Secondary | ICD-10-CM | POA: Insufficient documentation

## 2012-04-15 DIAGNOSIS — R110 Nausea: Secondary | ICD-10-CM | POA: Insufficient documentation

## 2012-04-15 DIAGNOSIS — F17200 Nicotine dependence, unspecified, uncomplicated: Secondary | ICD-10-CM | POA: Insufficient documentation

## 2012-04-15 DIAGNOSIS — Z8543 Personal history of malignant neoplasm of ovary: Secondary | ICD-10-CM | POA: Insufficient documentation

## 2012-04-15 DIAGNOSIS — R509 Fever, unspecified: Secondary | ICD-10-CM | POA: Insufficient documentation

## 2012-04-15 DIAGNOSIS — Z87442 Personal history of urinary calculi: Secondary | ICD-10-CM | POA: Insufficient documentation

## 2012-04-15 DIAGNOSIS — R109 Unspecified abdominal pain: Secondary | ICD-10-CM | POA: Insufficient documentation

## 2012-04-15 DIAGNOSIS — Z90710 Acquired absence of both cervix and uterus: Secondary | ICD-10-CM | POA: Insufficient documentation

## 2012-04-15 DIAGNOSIS — Z765 Malingerer [conscious simulation]: Secondary | ICD-10-CM | POA: Insufficient documentation

## 2012-04-15 DIAGNOSIS — R350 Frequency of micturition: Secondary | ICD-10-CM | POA: Insufficient documentation

## 2012-04-15 DIAGNOSIS — R300 Dysuria: Secondary | ICD-10-CM | POA: Insufficient documentation

## 2012-04-15 DIAGNOSIS — Z8659 Personal history of other mental and behavioral disorders: Secondary | ICD-10-CM | POA: Insufficient documentation

## 2012-04-15 LAB — CBC WITH DIFFERENTIAL/PLATELET
Basophils Absolute: 0 10*3/uL (ref 0.0–0.1)
Basophils Relative: 0 % (ref 0–1)
Eosinophils Absolute: 0 10*3/uL (ref 0.0–0.7)
Eosinophils Relative: 0 % (ref 0–5)
MCH: 32.2 pg (ref 26.0–34.0)
MCHC: 35 g/dL (ref 30.0–36.0)
MCV: 91.9 fL (ref 78.0–100.0)
Neutrophils Relative %: 71 % (ref 43–77)
Platelets: 309 10*3/uL (ref 150–400)
RDW: 12.2 % (ref 11.5–15.5)

## 2012-04-15 MED ORDER — PROMETHAZINE HCL 25 MG/ML IJ SOLN
25.0000 mg | Freq: Once | INTRAMUSCULAR | Status: AC
  Administered 2012-04-16: 25 mg via INTRAVENOUS
  Filled 2012-04-15: qty 1

## 2012-04-15 MED ORDER — KETOROLAC TROMETHAMINE 30 MG/ML IJ SOLN
15.0000 mg | Freq: Once | INTRAMUSCULAR | Status: DC
  Filled 2012-04-15: qty 1

## 2012-04-15 NOTE — ED Notes (Signed)
Pt c/o x 3 days frequent urination; today has only voided x 2; c/o right flank pain; nauseated; low grade fever; ibuprofen and phenergan taken prior to arrival; history of kidney stones

## 2012-04-15 NOTE — ED Provider Notes (Signed)
History     CSN: [id number]  Arrival date & time 04/15/12  1919   First MD Initiated Contact with Patient 04/15/12 2335      No chief complaint on file.  Note - [employer] Flag in System: Pt has attempted to use several aliases and blatantly false identifying information. Pt should be pre-screened for emergent conditions and if there are no emergent conditions, pt is trespassing on [facility] grounds and should be asked to leave and follow up with primary care physician. Dr. Read Drivers wished to charge woman with fraud but police officer explained that this was a less efficient route to take to prevent abuse of the system by this patient. [employer] prescription abuse task force notified by Officer K. Jolayne Panther.    (Consider location/radiation/quality/duration/timing/severity/associated sxs/prior treatment) HPI Courtney [name] is a 42 y.o. female patient has chief complaint frequency, dysuria and right flank pain, she also points low-grade fever nauseated despite medication with Phenergan, she's taken ibuprofen without any help of her severe right-sided flank pain. She is a self-reported history of kidney stones.  Denies any chest pain, shortness of breath, headache, diarrhea, hematochezia or melena.  Past Medical History  Diagnosis Date  . DJD (degenerative joint disease)   . Scoliosis   . Kidney stones   . Anxiety   . Cancer     ovarian  . Ovarian cancer dx'd 2005    Past Surgical History  Procedure Date  . Abdominal hysterectomy   . Tubal ligation     Family History  Problem Relation Age of Onset  . Heart failure Mother   . Heart failure Father     History  Substance Use Topics  . Smoking status: Current Every Day Smoker -- 0.5 packs/day    Types: Cigarettes  . Smokeless tobacco: Never Used  . Alcohol Use: No     Comment: seldom    OB History    Grav Para Term Preterm Abortions TAB SAB Ect Mult Living                  Review of Systems At least 10pt or greater  review of systems completed and are negative except where specified in the HPI.  Allergies  Morphine and related; Penicillins; and Zofran  Home Medications   Current Outpatient Rx  Name  Route  Sig  Dispense  Refill  . IBUPROFEN 200 MG PO TABS   Oral   Take 400 mg by mouth every 8 (eight) hours as needed. For pain.         . ADULT MULTIVITAMIN W/MINERALS CH   Oral   Take 1 tablet by mouth daily.         Marland Kitchen PROMETHAZINE HCL 25 MG PO TABS   Oral   Take 25 mg by mouth every 6 (six) hours as needed. For nausea.           BP 114/95  Pulse 94  Temp 98.4 F (36.9 C) (Oral)  Resp 22  Ht 5\' 4"  (1.626 m)  Wt 138 lb (62.596 kg)  BMI 23.69 kg/m2  SpO2 100%  LMP 03/10/2005  Physical Exam  Nursing notes reviewed.  Electronic medical record reviewed. VITAL SIGNS:   Filed Vitals:   04/15/12 1927 04/15/12 2335 04/16/12 0057  BP: 113/77 114/95 125/68  Pulse: 103 94 96  Temp: 99.7 F (37.6 C) 98.4 F (36.9 C) 98.1 F (36.7 C)  TempSrc: Oral Oral Oral  Resp: 16 22   Height: 5\' 4"  (  1.626 m)    Weight: 138 lb (62.596 kg)    SpO2: 97% 100% 99%   CONSTITUTIONAL: Awake, oriented, appears non-toxic HENT: Atraumatic, normocephalic, oral mucosa pink and moist, airway patent. Nares patent without drainage. External ears normal. EYES: Conjunctiva clear, EOMI, PERRLA NECK: Trachea midline, non-tender, supple CARDIOVASCULAR: Normal heart rate, Normal rhythm, No murmurs, rubs, gallops PULMONARY/CHEST: Clear to auscultation, no rhonchi, wheezes, or rales. Symmetrical breath sounds. Non-tender. ABDOMINAL: Non-distended, soft, non-tender - no rebound or guarding.  BS normal. Patient jumps when I placed my hand on the right flank prior to percussion-she cries out when I percuss the right flank NEUROLOGIC: Non-focal, moving all four extremities, no gross sensory or motor deficits. EXTREMITIES: No clubbing, cyanosis, or edema SKIN: Warm, Dry, No erythema, No rash  ED Course  Procedures  (including critical care time)  Labs Reviewed  CBC WITH DIFFERENTIAL - Abnormal; Notable for the following:    WBC 12.5 (*)    Hemoglobin 15.2 (*)    Neutro Abs 8.8 (*)    Monocytes Absolute 1.2 (*)    All other components within normal limits  COMPREHENSIVE METABOLIC PANEL - Abnormal; Notable for the following:    Potassium 3.3 (*)    ALT 39 (*)    All other components within normal limits  LIPASE, BLOOD   No results found.   1. Malingering       MDM  Courtney [name] is a 42 y.o. female presents with concerns for kidney stone versus complicated urinary tract infection. Ordered the patient appropriate first line pain medicine,Toradol for renal colic as well as antiemetic, labs were collected.  When the patient found out she was going to get Toradol, the patient decided to leave the emergency department.  Please see nurse's note below: "Pt refused Toradol earlier. Pt was groaning in pain, but when informed that she would only get Toradol tonight stated, "Well, you might as well take out the IV. I waited hours for this shit. I could have taken Ibuprofen at home if that were the case." Pt reported that she was going to take Phenergan from her niece. Pt stated, "I come in here and he makes it worse hitting my back." I informed pt that despite pain, the physician still had to continue his assessment. Pt cursing and threatened to sue. Asked pt if she wanted to stay and wait to be discharged properly. Pt informed me she'd rather go home and stay in bed. Pt cooperated for vitals, but pt refused to sign for the AMA. Pt stated, "I ain't signing shit." Pt dressed herself. Pt walked to hallway without groaning. Pt stood and waited for sprite and ice. Sprite and ice given."  Pain is sufficient to say that with patient's current behavior as well as ability to tolerate Sprite and ice, willingness to go home and treat herself in bed she has no emergent medical condition at this time.  She left before  I could speak with her further, she's had behavior similar to this in the past. There is also a flag and the patient's chart from [facility].  I think her past behavior and behavior tonight is sufficient evidence to diagnose the patient with malingering. I do not think this patient should be prescribed opioid narcotics in the future for similar complaints.       Herberg Skene, MD 04/17/12 0700

## 2012-04-16 LAB — COMPREHENSIVE METABOLIC PANEL
ALT: 39 U/L — ABNORMAL HIGH (ref 0–35)
AST: 27 U/L (ref 0–37)
Albumin: 4.3 g/dL (ref 3.5–5.2)
Alkaline Phosphatase: 57 U/L (ref 39–117)
Calcium: 9.5 mg/dL (ref 8.4–10.5)
GFR calc Af Amer: >90 mL/min (ref >90)
Glucose, Bld: 87 mg/dL (ref 70–99)
Potassium: 3.3 mEq/L — ABNORMAL LOW (ref 3.5–5.1)
Sodium: 138 mEq/L (ref 135–145)
Total Protein: 8.2 g/dL (ref 6.0–8.3)

## 2012-04-16 NOTE — ED Notes (Addendum)
Pt refused Toradol earlier. Pt was groaning in pain, but when informed that she would only get Toradol tonight stated, "Well, you might as well take out the IV. I waited hours for this shit. I could have taken Ibuprofen at home if that were the case." Pt reported that she was going to take Phenergan from her niece. Pt stated, "I come in here and he makes it worse hitting my back." I informed pt that despite pain, the physician still had to continue his assessment. Pt cursing and threatened to sue. Asked pt if she wanted to stay and wait to be discharged properly. Pt informed me she'd rather go home and stay in bed. Pt cooperated for vitals, but pt refused to sign for the AMA. Pt stated, "I ain't signing shit." Pt dressed herself. Pt walked to hallway without groaning. Pt stood and waited for sprite and ice. Sprite and ice given.

## 2012-04-19 ENCOUNTER — Ambulatory Visit (HOSPITAL_COMMUNITY): Payer: Self-pay

## 2012-04-19 ENCOUNTER — Encounter (HOSPITAL_COMMUNITY): Payer: Self-pay | Admitting: Emergency Medicine

## 2012-04-19 ENCOUNTER — Emergency Department (HOSPITAL_COMMUNITY)
Admission: EM | Admit: 2012-04-19 | Discharge: 2012-04-19 | Disposition: A | Discharge status: Home / Self Care | Payer: Self-pay | Attending: Emergency Medicine | Admitting: Emergency Medicine

## 2012-04-19 DIAGNOSIS — Z90710 Acquired absence of both cervix and uterus: Secondary | ICD-10-CM | POA: Insufficient documentation

## 2012-04-19 DIAGNOSIS — M4120 Other idiopathic scoliosis, site unspecified: Secondary | ICD-10-CM | POA: Insufficient documentation

## 2012-04-19 DIAGNOSIS — Z3202 Encounter for pregnancy test, result negative: Secondary | ICD-10-CM | POA: Insufficient documentation

## 2012-04-19 DIAGNOSIS — Z87442 Personal history of urinary calculi: Secondary | ICD-10-CM | POA: Insufficient documentation

## 2012-04-19 DIAGNOSIS — F17200 Nicotine dependence, unspecified, uncomplicated: Secondary | ICD-10-CM | POA: Insufficient documentation

## 2012-04-19 DIAGNOSIS — Z8543 Personal history of malignant neoplasm of ovary: Secondary | ICD-10-CM | POA: Insufficient documentation

## 2012-04-19 DIAGNOSIS — M159 Polyosteoarthritis, unspecified: Secondary | ICD-10-CM | POA: Insufficient documentation

## 2012-04-19 DIAGNOSIS — N12 Tubulo-interstitial nephritis, not specified as acute or chronic: Secondary | ICD-10-CM

## 2012-04-19 DIAGNOSIS — R109 Unspecified abdominal pain: Secondary | ICD-10-CM | POA: Insufficient documentation

## 2012-04-19 DIAGNOSIS — Z8659 Personal history of other mental and behavioral disorders: Secondary | ICD-10-CM | POA: Insufficient documentation

## 2012-04-19 LAB — CBC WITH DIFFERENTIAL/PLATELET
Basophils Relative: 0 % (ref 0–1)
Eosinophils Absolute: 0 10*3/uL (ref 0.0–0.7)
Eosinophils Relative: 0 % (ref 0–5)
HCT: 40.3 % (ref 36.0–46.0)
Hemoglobin: 14.2 g/dL (ref 12.0–15.0)
Lymphs Abs: 1.3 10*3/uL (ref 0.7–4.0)
MCH: 31.4 pg (ref 26.0–34.0)
MCHC: 35.2 g/dL (ref 30.0–36.0)
MCV: 89.2 fL (ref 78.0–100.0)
Monocytes Absolute: 1.5 10*3/uL — ABNORMAL HIGH (ref 0.1–1.0)
Monocytes Relative: 9 % (ref 3–12)

## 2012-04-19 LAB — URINALYSIS, ROUTINE W REFLEX MICROSCOPIC
Specific Gravity, Urine: 1.03 (ref 1.005–1.030)
Urobilinogen, UA: 2 mg/dL — ABNORMAL HIGH (ref 0.0–1.0)
pH: 6 (ref 5.0–8.0)

## 2012-04-19 LAB — COMPREHENSIVE METABOLIC PANEL
Albumin: 3.7 g/dL (ref 3.5–5.2)
BUN: 13 mg/dL (ref 6–23)
Creatinine, Ser: 0.66 mg/dL (ref 0.50–1.10)
GFR calc Af Amer: >90 mL/min (ref >90)
Glucose, Bld: 93 mg/dL (ref 70–99)
Total Protein: 7.8 g/dL (ref 6.0–8.3)

## 2012-04-19 LAB — URINE MICROSCOPIC-ADD ON

## 2012-04-19 LAB — LIPASE, BLOOD: Lipase: 21 U/L (ref 11–59)

## 2012-04-19 LAB — PREGNANCY, URINE: Preg Test, Ur: NEGATIVE

## 2012-04-19 MED ORDER — HYDROMORPHONE HCL PF 1 MG/ML IJ SOLN
1.0000 mg | Freq: Once | INTRAMUSCULAR | Status: AC
  Administered 2012-04-19: 1 mg via INTRAVENOUS
  Filled 2012-04-19: qty 1

## 2012-04-19 MED ORDER — IOHEXOL 300 MG/ML  SOLN
80.0000 mL | Freq: Once | INTRAMUSCULAR | Status: AC | PRN
  Administered 2012-04-19: 80 mL via INTRAVENOUS

## 2012-04-19 MED ORDER — CEPHALEXIN 500 MG PO CAPS: 500.0000 mg | ORAL_CAPSULE | Freq: Four times a day (QID) | ORAL | Status: DC

## 2012-04-19 MED ORDER — SODIUM CHLORIDE 0.9 % IV SOLN
1000.0000 mL | Freq: Once | INTRAVENOUS | Status: AC
  Administered 2012-04-19: 1000 mL via INTRAVENOUS

## 2012-04-19 MED ORDER — SODIUM CHLORIDE 0.9 % IV SOLN
1000.0000 mL | INTRAVENOUS | Status: DC
  Administered 2012-04-19: 1000 mL via INTRAVENOUS

## 2012-04-19 MED ORDER — LORAZEPAM 2 MG/ML IJ SOLN
1.0000 mg | Freq: Once | INTRAMUSCULAR | Status: AC
  Administered 2012-04-19: 1 mg via INTRAVENOUS
  Filled 2012-04-19: qty 1

## 2012-04-19 MED ORDER — IOHEXOL 300 MG/ML  SOLN
50.0000 mL | Freq: Once | INTRAMUSCULAR | Status: AC | PRN
  Administered 2012-04-19: 50 mL via ORAL

## 2012-04-19 MED ORDER — HYDROCODONE-ACETAMINOPHEN 5-325 MG PO TABS: 1.0000 | ORAL_TABLET | ORAL | Status: DC | PRN

## 2012-04-19 MED ORDER — KETOROLAC TROMETHAMINE 30 MG/ML IJ SOLN
30.0000 mg | Freq: Once | INTRAMUSCULAR | Status: AC
  Administered 2012-04-19: 30 mg via INTRAVENOUS
  Filled 2012-04-19: qty 1

## 2012-04-19 MED ORDER — METOCLOPRAMIDE HCL 5 MG/ML IJ SOLN
10.0000 mg | Freq: Once | INTRAMUSCULAR | Status: AC
  Administered 2012-04-19: 10 mg via INTRAVENOUS
  Filled 2012-04-19: qty 2

## 2012-04-19 MED ORDER — ONDANSETRON HCL 4 MG/2ML IJ SOLN
4.0000 mg | Freq: Once | INTRAMUSCULAR | Status: DC
  Filled 2012-04-19: qty 2

## 2012-04-19 MED ORDER — DEXTROSE 5 % IV SOLN
1.0000 g | Freq: Once | INTRAVENOUS | Status: AC
  Administered 2012-04-19: 1 g via INTRAVENOUS
  Filled 2012-04-19: qty 10

## 2012-04-19 MED ORDER — PROMETHAZINE HCL 25 MG PO TABS: 25.0000 mg | ORAL_TABLET | Freq: Four times a day (QID) | ORAL | Status: DC | PRN

## 2012-04-19 NOTE — ED Notes (Addendum)
Per EMS: pt c/o of n/v x 2 days. Was just here Friday for same thing.

## 2012-04-19 NOTE — ED Notes (Signed)
GMW:[id number]<[id number]> Expected date:<BR> Expected time:<BR> Means of arrival:<BR> Comments:<BR> EMS 41 y.o vomiting

## 2012-04-19 NOTE — ED Provider Notes (Signed)
History     CSN: [id number]  Arrival date & time 04/19/12  1439   First MD Initiated Contact with Patient 04/19/12 1511      Chief Complaint  Patient presents with  . Nausea  . Emesis     The history is provided by the patient.   patient reports nausea and vomiting without diarrhea over the past several days.  She's had subjective fever and chills without documented fever.  She denies hematemesis.  She's been on chronic pain medicine but is no longer on her pain medications.  She does report that sometimes she uses pain medicine that she gets off the street secondary to her degenerative joint disease.  She is currently without a primary care physician or insurance.  She denies abdominal pain but does report some right flank pain.  She has a history of kidney stones.  No vaginal complaints periods and urinary frequency without dysuria.  Symptoms are moderate in severity.   Past Medical History  Diagnosis Date  . DJD (degenerative joint disease)   . Scoliosis   . Kidney stones   . Anxiety   . Cancer     ovarian  . Ovarian cancer dx'd 2005    Past Surgical History  Procedure Laterality Date  . Abdominal hysterectomy    . Tubal ligation      Family History  Problem Relation Age of Onset  . Heart failure Mother   . Heart failure Father     History  Substance Use Topics  . Smoking status: Current Every Day Smoker -- 0.50 packs/day    Types: Cigarettes  . Smokeless tobacco: Never Used  . Alcohol Use: No     Comment: seldom    OB History   Grav Para Term Preterm Abortions TAB SAB Ect Mult Living                  Review of Systems  Gastrointestinal: Positive for vomiting.  All other systems reviewed and are negative.    Allergies  Morphine and related; Penicillins; and Zofran  Home Medications   Current Outpatient Rx  Name  Route  Sig  Dispense  Refill  . ibuprofen (ADVIL,MOTRIN) 200 MG tablet   Oral   Take 400 mg by mouth every 8 (eight) hours as needed.  For pain.         . Multiple Vitamin (MULTIVITAMIN WITH MINERALS) TABS   Oral   Take 1 tablet by mouth daily.         . promethazine (PHENERGAN) 25 MG tablet   Oral   Take 25 mg by mouth every 6 (six) hours as needed. For nausea.           BP 111/67  Pulse 76  Temp(Src) 100.8 F (38.2 C) (Oral)  Resp 18  SpO2 99%  LMP 03/10/2005  Physical Exam  Nursing note and vitals reviewed. Constitutional: She is oriented to person, place, and time. She appears well-developed and well-nourished. No distress.  HENT:  Head: Normocephalic and atraumatic.  Eyes: EOM are normal.  Neck: Normal range of motion.  Cardiovascular: Normal rate, regular rhythm and normal heart sounds.   Pulmonary/Chest: Effort normal and breath sounds normal.  Abdominal: Soft. She exhibits no distension. There is no tenderness.  Genitourinary:   Mild right CVA tenderness  Musculoskeletal: Normal range of motion.  Neurological: She is alert and oriented to person, place, and time.  Skin: Skin is warm and dry.  Psychiatric: She has a normal mood  and affect. Judgment normal.    ED Course  Procedures (including critical care time)  Labs Reviewed  URINALYSIS, ROUTINE W REFLEX MICROSCOPIC - Abnormal; Notable for the following:    Color, Urine AMBER (*)    APPearance CLOUDY (*)    Hgb urine dipstick TRACE (*)    Bilirubin Urine SMALL (*)    Ketones, ur >80 (*)    Protein, ur 30 (*)    Urobilinogen, UA 2.0 (*)    Nitrite POSITIVE (*)    Leukocytes, UA MODERATE (*)    All other components within normal limits  CBC WITH DIFFERENTIAL - Abnormal; Notable for the following:    WBC 16.9 (*)    Neutrophils Relative 83 (*)    Neutro Abs 14.1 (*)    Lymphocytes Relative 8 (*)    Monocytes Absolute 1.5 (*)    All other components within normal limits  COMPREHENSIVE METABOLIC PANEL - Abnormal; Notable for the following:    Sodium 134 (*)    Potassium 2.6 (*)    Chloride 94 (*)    All other components within  normal limits  URINE MICROSCOPIC-ADD ON - Abnormal; Notable for the following:    Squamous Epithelial / LPF FEW (*)    Bacteria, UA FEW (*)    All other components within normal limits  URINE CULTURE  PREGNANCY, URINE  LIPASE, BLOOD   Ct Abdomen Pelvis W Contrast  04/19/2012  *RADIOLOGY REPORT*  Clinical Data: Nausea, vomiting.  CT ABDOMEN AND PELVIS WITH CONTRAST  Technique:  Multidetector CT imaging of the abdomen and pelvis was performed following the standard protocol during bolus administration of intravenous contrast.  Contrast: 80mL OMNIPAQUE IOHEXOL 300 MG/ML  SOLN, 50mL OMNIPAQUE IOHEXOL 300 MG/ML  SOLN  Comparison: 12/15/2011  Findings: Lung bases are clear.  No effusions.  Heart is normal size.  Liver, gallbladder, stomach, pancreas, spleen, adrenals and the left kidney unremarkable.  There are areas of abnormal enhancement with striations in the right kidney compatible with right pyelonephritis.  Slight perinephric stranding.  No hydronephrosis.  Bowel grossly unremarkable.  No free fluid, free air, or adenopathy.  Appendix not definitively seen, but no inflammatory process in the right lower quadrant.  No acute bony abnormality. 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  IMPRESSION: Areas of poor enhancement within the right kidney with a striated nephrogram compatible with pyelonephritis.   Original Report Authenticated By: Charlett Nose, M.D.    I personally reviewed the imaging tests through PACS system I reviewed available ER/hospitalization records through the EMR   1. Pyelonephritis       MDM  10:31 PM Right-sided pyelonephritis.  Patient feels much better at this time.  Home with pain medicine, and nausea medicine, antibiotics.  She understands to return to the ER for new or worsening symptoms.  Aside low-grade temperature vital signs are otherwise without significant abnormality        Lyanne Co, MD 04/19/12 2233

## 2012-04-21 LAB — URINE CULTURE: Colony Count: >=100000

## 2012-04-21 NOTE — Progress Notes (Signed)
During [facility] ED 04/19/12 visit pt was seen by [facility] liaison  Pt offered services to assist with finding a [place] self pay provider, resources & health reform information  

## 2012-04-22 ENCOUNTER — Telehealth (HOSPITAL_COMMUNITY): Payer: Self-pay | Admitting: Emergency Medicine

## 2012-04-22 NOTE — ED Notes (Signed)
Positive URNC, treated with Keflex- sensitive to same. No further treatment needed at this time

## 2012-06-02 ENCOUNTER — Emergency Department (HOSPITAL_COMMUNITY)
Admission: EM | Admit: 2012-06-02 | Discharge: 2012-06-02 | Discharge status: Against Medical Advice | Payer: Self-pay | Attending: Emergency Medicine | Admitting: Emergency Medicine

## 2012-06-02 ENCOUNTER — Encounter (HOSPITAL_COMMUNITY): Payer: Self-pay | Admitting: Family Medicine

## 2012-06-02 DIAGNOSIS — Z8659 Personal history of other mental and behavioral disorders: Secondary | ICD-10-CM | POA: Insufficient documentation

## 2012-06-02 DIAGNOSIS — R109 Unspecified abdominal pain: Secondary | ICD-10-CM | POA: Insufficient documentation

## 2012-06-02 DIAGNOSIS — F17200 Nicotine dependence, unspecified, uncomplicated: Secondary | ICD-10-CM | POA: Insufficient documentation

## 2012-06-02 DIAGNOSIS — Z8739 Personal history of other diseases of the musculoskeletal system and connective tissue: Secondary | ICD-10-CM | POA: Insufficient documentation

## 2012-06-02 DIAGNOSIS — Z8543 Personal history of malignant neoplasm of ovary: Secondary | ICD-10-CM | POA: Insufficient documentation

## 2012-06-02 DIAGNOSIS — Z9851 Tubal ligation status: Secondary | ICD-10-CM | POA: Insufficient documentation

## 2012-06-02 DIAGNOSIS — R51 Headache: Secondary | ICD-10-CM | POA: Insufficient documentation

## 2012-06-02 DIAGNOSIS — Z87442 Personal history of urinary calculi: Secondary | ICD-10-CM | POA: Insufficient documentation

## 2012-06-02 DIAGNOSIS — Z765 Malingerer [conscious simulation]: Secondary | ICD-10-CM | POA: Insufficient documentation

## 2012-06-02 DIAGNOSIS — Z90710 Acquired absence of both cervix and uterus: Secondary | ICD-10-CM | POA: Insufficient documentation

## 2012-06-02 LAB — COMPREHENSIVE METABOLIC PANEL
ALT: 146 U/L — ABNORMAL HIGH (ref 0–35)
AST: 72 U/L — ABNORMAL HIGH (ref 0–37)
Alkaline Phosphatase: 65 U/L (ref 39–117)
CO2: 20 mEq/L (ref 19–32)
Chloride: 101 mEq/L (ref 96–112)
GFR calc Af Amer: >90 mL/min (ref >90)
GFR calc non Af Amer: >90 mL/min (ref >90)
Glucose, Bld: 100 mg/dL — ABNORMAL HIGH (ref 70–99)
Sodium: 136 mEq/L (ref 135–145)
Total Bilirubin: 1 mg/dL (ref 0.3–1.2)

## 2012-06-02 LAB — CBC WITH DIFFERENTIAL/PLATELET
Basophils Absolute: 0 10*3/uL (ref 0.0–0.1)
Eosinophils Relative: 0 % (ref 0–5)
HCT: 44.7 % (ref 36.0–46.0)
Lymphocytes Relative: 37 % (ref 12–46)
Lymphs Abs: 4 10*3/uL (ref 0.7–4.0)
MCV: 89 fL (ref 78.0–100.0)
Neutro Abs: 6.1 10*3/uL (ref 1.7–7.7)
Platelets: 383 10*3/uL (ref 150–400)
RBC: 5.02 MIL/uL (ref 3.87–5.11)
RDW: 12.2 % (ref 11.5–15.5)
WBC: 10.8 10*3/uL — ABNORMAL HIGH (ref 4.0–10.5)

## 2012-06-02 MED ORDER — GI COCKTAIL ~~LOC~~
30.0000 mL | Freq: Once | ORAL | Status: AC
  Administered 2012-06-02: 30 mL via ORAL
  Filled 2012-06-02: qty 30

## 2012-06-02 MED ORDER — METOCLOPRAMIDE HCL 5 MG/ML IJ SOLN
10.0000 mg | Freq: Once | INTRAMUSCULAR | Status: AC
  Administered 2012-06-02: 10 mg via INTRAVENOUS
  Filled 2012-06-02: qty 2

## 2012-06-02 MED ORDER — PANTOPRAZOLE SODIUM 40 MG IV SOLR
40.0000 mg | Freq: Once | INTRAVENOUS | Status: AC
  Administered 2012-06-02: 40 mg via INTRAVENOUS
  Filled 2012-06-02: qty 40

## 2012-06-02 MED ORDER — SODIUM CHLORIDE 0.9 % IV BOLUS (SEPSIS)
1000.0000 mL | Freq: Once | INTRAVENOUS | Status: AC
  Administered 2012-06-02: 1000 mL via INTRAVENOUS

## 2012-06-02 NOTE — ED Notes (Signed)
Pt requesting narcotic pain medications for pain and states "If I am not going to get any I will just leave"; RN advised pt that no pain medication has been ordered at this time and that I would let the PA know of her request and that we are treating and evaluating her complaints at the present time. Katie, PA notified and in room to speak to pt; PA notified pt that no narcotic pain medications would be administered at this time; pt requests to leave AMA.

## 2012-06-02 NOTE — ED Notes (Signed)
Pt cursing; stating we haven't done anything for her but give her reglan and she needs more; Katie PA in to see patient; informed pt she needed her to stay to continue her evaluation; pt stated she wasn't staying because we weren't do anything for her; pt continued to curse staff

## 2012-06-02 NOTE — ED Notes (Signed)
Window RN: Pt came to window.  Naming multiple cold-like symptoms.  RN did not hear chest pain at the time.  Notified Triage RN of FYI status.  Pt asked about symptoms and is now stating chest pain. ID verified with driver's license at window.   Pt transferred to Triage 1 for evaluation.  Consulting civil engineer notified.

## 2012-06-02 NOTE — ED Notes (Signed)
Patient states that she started having chest pain yesterday. Also reports having diarrhea, nausea and headache. States being unable to sleep due to the chest pain. States pain worse when she takes a deep breath.

## 2012-06-03 NOTE — ED Provider Notes (Signed)
History     CSN: [id number]  Arrival date & time 06/02/12  2005   First MD Initiated Contact with Patient 06/02/12 2107      Chief Complaint  Patient presents with  . Chest Pain  . Headache    (Consider location/radiation/quality/duration/timing/severity/associated sxs/prior treatment) HPI History provided by pt and prior chart.  Per prior chart, pt has been suspected of malingering on multiple occasions as well as identity theft.  Presents today w/ intermittent epigastric/LUQ pain that radiates into left side of chest since yesterday.  Non-pleuritic, non-exertional, non-positional and not related to po intake.  Associated w/ N/V/D and cough that started simultaneously.  Denies SOB and LE edema/pain.  No RF for PE.   Past Medical History  Diagnosis Date  . DJD (degenerative joint disease)   . Scoliosis   . Kidney stones   . Anxiety   . Cancer     ovarian  . Ovarian cancer dx'd 2005    Past Surgical History  Procedure Laterality Date  . Abdominal hysterectomy    . Tubal ligation      Family History  Problem Relation Age of Onset  . Heart failure Mother   . Heart failure Father     History  Substance Use Topics  . Smoking status: Current Every Day Smoker -- 0.50 packs/day    Types: Cigarettes  . Smokeless tobacco: Never Used  . Alcohol Use: Yes     Comment: Rarely    OB History   Grav Para Term Preterm Abortions TAB SAB Ect Mult Living                  Review of Systems  All other systems reviewed and are negative.    Allergies  Morphine and related; Penicillins; and Zofran  Home Medications   Current Outpatient Rx  Name  Route  Sig  Dispense  Refill  . Multiple Vitamin (MULTIVITAMIN WITH MINERALS) TABS   Oral   Take 1 tablet by mouth daily.           BP 128/98  Pulse 122  Temp(Src) 97.7 F (36.5 C) (Oral)  Resp 17  SpO2 100%  LMP 03/10/2005  Physical Exam  Nursing note and vitals reviewed. Constitutional: She is oriented to person,  place, and time. She appears well-developed and well-nourished. No distress.  HENT:  Head: Normocephalic and atraumatic.  Eyes:  Normal appearance  Neck: Normal range of motion.  Cardiovascular: Normal rate, regular rhythm and intact distal pulses.   Pulmonary/Chest: Effort normal and breath sounds normal. No respiratory distress.  No pleuritic pain reported.  Entire chest ttp, particularly left anterior.    Abdominal: Soft. Bowel sounds are normal. She exhibits no distension. There is no guarding.  Diffuse ttp, particularly in LUQ  Musculoskeletal: Normal range of motion.  No peripheral edema or calf tenderness  Neurological: She is alert and oriented to person, place, and time.  Skin: Skin is warm and dry. No rash noted.  Psychiatric: She has a normal mood and affect. Her behavior is normal.    ED Course  Procedures (including critical care time)  Labs Reviewed  CBC WITH DIFFERENTIAL - Abnormal; Notable for the following:    WBC 10.8 (*)    Hemoglobin 15.7 (*)    All other components within normal limits  COMPREHENSIVE METABOLIC PANEL - Abnormal; Notable for the following:    Glucose, Bld 100 (*)    Total Protein 8.5 (*)    AST 72 (*)  ALT 146 (*)    All other components within normal limits  LIPASE, BLOOD   No results found.   1. Malingering       MDM  41yo F who has been suspected of malingering on multiple occasions as well as identity theft, presents w/ epigastric/LUQ pain w/ radiation to left chest.  On initial exam, afebrile, NAD, abd diffusely tender, particularly in epigastrium/LUQ but otherwise benign, diffuse chest ttp, no LE edema/ttp.  No RF for PE.  Doubt ACS; pain atypical, TIMI 0, EKG non-ischemic.  GI cocktail and IV protonix and reglan ordered for sx.  Gastritis or pancreatitis suspected at that time.  Pt became agitated, was cursing, told nursing staff that we had not done anything for her symptoms and refused treatment unless she received dilaudid.  I  politely explained to her that I would not be treating her with narcotics at this time, what my differential diagnosis was and how the medications ordered should help.  Pt decided to leave despite discussion of risks.  Security escorted her out.           Otilio Miu, PA-C 06/03/12 [phone]

## 2012-06-03 NOTE — ED Provider Notes (Signed)
Medical screening examination/treatment/procedure(s) were performed by non-physician practitioner and as supervising physician I was immediately available for consultation/collaboration. Maiya Kates, MD, FACEP   Thuan Tippett L Kenzey Birkland, MD 06/03/12 2028 

## 2012-10-29 ENCOUNTER — Ambulatory Visit: Payer: Self-pay

## 2012-11-01 ENCOUNTER — Encounter: Payer: Self-pay | Admitting: Internal Medicine

## 2012-11-01 ENCOUNTER — Ambulatory Visit: Payer: Self-pay | Attending: Family Medicine | Admitting: Internal Medicine

## 2012-11-01 VITALS — BP 110/78 | HR 77 | Temp 97.9°F | Resp 18 | Ht 62.99 in | Wt 153.5 lb

## 2012-11-01 DIAGNOSIS — N951 Menopausal and female climacteric states: Secondary | ICD-10-CM

## 2012-11-01 DIAGNOSIS — S93401S Sprain of unspecified ligament of right ankle, sequela: Secondary | ICD-10-CM

## 2012-11-01 DIAGNOSIS — IMO0002 Reserved for concepts with insufficient information to code with codable children: Secondary | ICD-10-CM

## 2012-11-01 DIAGNOSIS — N390 Urinary tract infection, site not specified: Secondary | ICD-10-CM

## 2012-11-01 DIAGNOSIS — Z7689 Persons encountering health services in other specified circumstances: Secondary | ICD-10-CM

## 2012-11-01 DIAGNOSIS — Z7189 Other specified counseling: Secondary | ICD-10-CM

## 2012-11-01 DIAGNOSIS — F411 Generalized anxiety disorder: Secondary | ICD-10-CM | POA: Insufficient documentation

## 2012-11-01 MED ORDER — SULFAMETHOXAZOLE-TMP DS 800-160 MG PO TABS: 1.0000 | ORAL_TABLET | Freq: Two times a day (BID) | ORAL | Status: DC

## 2012-11-01 MED ORDER — IBUPROFEN 600 MG PO TABS: 600.0000 mg | ORAL_TABLET | Freq: Three times a day (TID) | ORAL | Status: DC | PRN

## 2012-11-01 NOTE — Progress Notes (Signed)
Pt is here to est care C/o right foot inj onset Sat... Reports she twisted as she was getting off a car Sxs include: swelling, bruising, painful  Also c/o poss UTI Sxs include: dark urine color, foul odor, back pain Denies: dysuria, freq/urgency, hematuria, abd pain  Pt on methadone... Not allowed any narcotics... She is alert w/no signs of acute distress.

## 2012-11-01 NOTE — Patient Instructions (Signed)
Urinary Tract Infection Urinary tract infections (UTIs) can develop anywhere along your urinary tract. Your urinary tract is your body's drainage system for removing wastes and extra water. Your urinary tract includes two kidneys, two ureters, a bladder, and a urethra. Your kidneys are a pair of bean-shaped organs. Each kidney is about the size of your fist. They are located below your ribs, one on each side of your spine. CAUSES Infections are caused by microbes, which are microscopic organisms, including fungi, viruses, and bacteria. These organisms are so small that they can only be seen through a microscope. Bacteria are the microbes that most commonly cause UTIs. SYMPTOMS  Symptoms of UTIs may vary by age and gender of the patient and by the location of the infection. Symptoms in young women typically include a frequent and intense urge to urinate and a painful, burning feeling in the bladder or urethra during urination. Older women and men are more likely to be tired, shaky, and weak and have muscle aches and abdominal pain. A fever may mean the infection is in your kidneys. Other symptoms of a kidney infection include pain in your back or sides below the ribs, nausea, and vomiting. DIAGNOSIS To diagnose a UTI, your caregiver will ask you about your symptoms. Your caregiver also will ask to provide a urine sample. The urine sample will be tested for bacteria and white blood cells. White blood cells are made by your body to help fight infection. TREATMENT  Typically, UTIs can be treated with medication. Because most UTIs are caused by a bacterial infection, they usually can be treated with the use of antibiotics. The choice of antibiotic and length of treatment depend on your symptoms and the type of bacteria causing your infection. HOME CARE INSTRUCTIONS  If you were prescribed antibiotics, take them exactly as your caregiver instructs you. Finish the medication even if you feel better after you  have only taken some of the medication.  Drink enough water and fluids to keep your urine clear or pale yellow.  Avoid caffeine, tea, and carbonated beverages. They tend to irritate your bladder.  Empty your bladder often. Avoid holding urine for long periods of time.  Empty your bladder before and after sexual intercourse.  After a bowel movement, women should cleanse from front to back. Use each tissue only once. SEEK MEDICAL CARE IF:   You have back pain.  You develop a fever.  Your symptoms do not begin to resolve within 3 days. SEEK IMMEDIATE MEDICAL CARE IF:   You have severe back pain or lower abdominal pain.  You develop chills.  You have nausea or vomiting.  You have continued burning or discomfort with urination. MAKE SURE YOU:   Understand these instructions.  Will watch your condition.  Will get help right away if you are not doing well or get worse. Document Released: 12/04/2004 Document Revised: 08/26/2011 Document Reviewed: 04/04/2011 [facility] Patient Information 2014 [place], [place].  Ankle Sprain An ankle sprain is an injury to the strong, fibrous tissues (ligaments) that hold the bones of your ankle joint together.  CAUSES An ankle sprain is usually caused by a fall or by twisting your ankle. Ankle sprains most commonly occur when you step on the outer edge of your foot, and your ankle turns inward. People who participate in sports are more prone to these types of injuries.  SYMPTOMS   Pain in your ankle. The pain may be present at rest or only when you are trying to stand  or walk.  Swelling.  Bruising. Bruising may develop immediately or within 1 to 2 days after your injury.  Difficulty standing or walking, particularly when turning corners or changing directions. DIAGNOSIS  Your caregiver will ask you details about your injury and perform a physical exam of your ankle to determine if you have an ankle sprain. During the physical exam, your  caregiver will press on and apply pressure to specific areas of your foot and ankle. Your caregiver will try to move your ankle in certain ways. An X-ray exam may be done to be sure a bone was not broken or a ligament did not separate from one of the bones in your ankle (avulsion fracture).  TREATMENT  Certain types of braces can help stabilize your ankle. Your caregiver can make a recommendation for this. Your caregiver may recommend the use of medicine for pain. If your sprain is severe, your caregiver may refer you to a surgeon who helps to restore function to parts of your skeletal system (orthopedist) or a physical therapist. HOME CARE INSTRUCTIONS   Apply ice to your injury for 1 2 days or as directed by your caregiver. Applying ice helps to reduce inflammation and pain.  Put ice in a plastic bag.  Place a towel between your skin and the bag.  Leave the ice on for 15-20 minutes at a time, every 2 hours while you are awake.  Only take over-the-counter or prescription medicines for pain, discomfort, or fever as directed by your caregiver.  Elevate your injured ankle above the level of your heart as much as possible for 2 3 days.  If your caregiver recommends crutches, use them as instructed. Gradually put weight on the affected ankle. Continue to use crutches or a cane until you can walk without feeling pain in your ankle.  If you have a plaster splint, wear the splint as directed by your caregiver. Do not rest it on anything harder than a pillow for the first 24 hours. Do not put weight on it. Do not get it wet. You may take it off to take a shower or bath.  You may have been given an elastic bandage to wear around your ankle to provide support. If the elastic bandage is too tight (you have numbness or tingling in your foot or your foot becomes cold and blue), adjust the bandage to make it comfortable.  If you have an air splint, you may blow more air into it or let air out to make it  more comfortable. You may take your splint off at night and before taking a shower or bath. Wiggle your toes in the splint several times per day to decrease swelling. SEEK MEDICAL CARE IF:   You have rapidly increasing bruising or swelling.  Your toes feel extremely cold or you lose feeling in your foot.  Your pain is not relieved with medicine. SEEK IMMEDIATE MEDICAL CARE IF:  Your toes are numb or blue.  You have severe pain that is increasing. MAKE SURE YOU:   Understand these instructions.  Will watch your condition.  Will get help right away if you are not doing well or get worse. Document Released: 02/24/2005 Document Revised: 11/19/2011 Document Reviewed: 03/08/2011 [facility] Patient Information 2014 [place], [place].

## 2012-11-01 NOTE — Progress Notes (Signed)
Patient ID: LEALER MARSLAND, female   DOB: 05/16/1970, 42 y.o.   MRN: [id number]  CC: To establish care  HPI: Ms. Keiper is a 42 years of woman who came to clinic today to establish medical care. She has past history of anxiety disorder, and polysubstance abuse, currently undergoing rehabilitation and on methadone treatment. She complains of a sprained right ankle that happened about 2 weeks ago and the pain is getting worse with minimal swelling. She has a complaint of generalized anxiety Prevents her from sleeping but she's been want from her methadone program not to be on any medication or cause her to be fired. She also has hot flushes that she thinks might be because she had hysterectomy done and she is around menopause age. She would like to be sent to gynecologist for the possibility of starting her hormone replacement therapy as this was done for her before. She denies any chest pain, no headache. She also complained of her urine being cloudy and foul-smelling. She denies dysuria, dyspareunia.   Allergies  Allergen Reactions  . Morphine And Related Itching  . Penicillins Anaphylaxis, Hives and Swelling  . Zofran Itching and Other (See Comments)    "makes me feel funny"   Past Medical History  Diagnosis Date  . DJD (degenerative joint disease)   . Scoliosis   . Kidney stones   . Anxiety   . Cancer     ovarian  . Ovarian cancer dx'd 2005  . Substance abuse     Vicodin   Current Outpatient Prescriptions on File Prior to Visit  Medication Sig Dispense Refill  . Multiple Vitamin (MULTIVITAMIN WITH MINERALS) TABS Take 1 tablet by mouth daily.       No current facility-administered medications on file prior to visit.   Family History  Problem Relation Age of Onset  . Heart failure Mother   . Heart failure Father    History   Social History  . Marital Status: Married    Spouse Name: N/A    Number of Children: N/A  . Years of Education: N/A   Occupational History  . Not on  file.   Social History Main Topics  . Smoking status: Current Every Day Smoker -- 1.00 packs/day    Types: Cigarettes  . Smokeless tobacco: Never Used  . Alcohol Use: Yes     Comment: Rarely  . Drug Use: No  . Sexual Activity: Yes    Birth Control/ Protection: None   Other Topics Concern  . Not on file   Social History Narrative  . No narrative on file    Review of Systems: Constitutional: Negative for fever, chills, diaphoresis, activity change, appetite change and fatigue. HENT: Negative for ear pain, nosebleeds, congestion, facial swelling, rhinorrhea, neck pain, neck stiffness and ear discharge.  Eyes: Negative for pain, discharge, redness, itching and visual disturbance. Respiratory: Negative for cough, choking, chest tightness, shortness of breath, wheezing and stridor.  Cardiovascular: Negative for chest pain, palpitations and leg swelling. Gastrointestinal: Negative for abdominal distention. Neurological: Negative for dizziness, tremors, seizures, syncope, facial asymmetry, speech difficulty, weakness, light-headedness, numbness and headaches.  Hematological: Negative for adenopathy. Does not bruise/bleed easily. Psychiatric/Behavioral: Negative for hallucinations, behavioral problems, confusion, dysphoric mood, decreased concentration and agitation.    Objective:   Filed Vitals:   11/01/12 1645  BP: 110/78  Pulse: 77  Temp: 97.9 F (36.6 C)  Resp: 18    Physical Exam: Constitutional: Patient appears well-developed and well-nourished. No distress. HENT: Normocephalic, atraumatic,  External right and left ear normal. Oropharynx is clear and moist.  Eyes: Conjunctivae and EOM are normal. PERRLA, no scleral icterus. Neck: Normal ROM. Neck supple. No JVD. No tracheal deviation. No thyromegaly. CVS: RRR, S1/S2 +, no murmurs, no gallops, no carotid bruit.  Pulmonary: Effort and breath sounds normal, no stridor, rhonchi, wheezes, rales.  Abdominal: Soft. BS +,  no  distension, tenderness, rebound or guarding.  Musculoskeletal: Marked tenderness (exaggerated) on the right ankle joint and foot, slightly swollen Lymphadenopathy: No lymphadenopathy noted, cervical, inguinal or axillary Neuro: Alert. Normal reflexes, muscle tone coordination. No cranial nerve deficit. Skin: Skin is warm and dry. No rash noted. Not diaphoretic. No erythema. No pallor. Psychiatric: Normal mood and affect. Behavior, judgment, thought content normal.  Lab Results  Component Value Date   WBC 10.8* 06/02/2012   HGB 15.7* 06/02/2012   HCT 44.7 06/02/2012   MCV 89.0 06/02/2012   PLT 383 06/02/2012   Lab Results  Component Value Date   CREATININE 0.70 06/02/2012   BUN 14 06/02/2012   NA 136 06/02/2012   K 3.5 06/02/2012   CL 101 06/02/2012   CO2 20 06/02/2012    No results found for this basename: HGBA1C   Lipid Panel     Component Value Date/Time   CHOL  Value: 184        ATP III CLASSIFICATION:  <200     mg/dL   Desirable  161-096  mg/dL   Borderline High  >=045    mg/dL   High        06/16/8117 0520   TRIG 57 08/01/2008 0520   HDL 40 08/01/2008 0520   CHOLHDL 4.6 08/01/2008 0520   VLDL 11 08/01/2008 0520   LDLCALC  Value: 133        Total Cholesterol/HDL:CHD Risk Coronary Heart Disease Risk Table                     Men   Women  1/2 Average Risk   3.4   3.3  Average Risk       5.0   4.4  2 X Average Risk   9.6   7.1  3 X Average Risk  23.4   11.0        Use the calculated Patient Ratio above and the CHD Risk Table to determine the patient's CHD Risk.        ATP III CLASSIFICATION (LDL):  <100     mg/dL   Optimal  147-829  mg/dL   Near or Above                    Optimal  130-159  mg/dL   Borderline  562-130  mg/dL   High  >865     mg/dL   Very High* 7/84/6962 0520       Assessment and plan:   Patient Active Problem List   Diagnosis Date Noted  . Right ankle sprain 11/01/2012  . Encounter to establish care 11/01/2012  . Anxiety state, unspecified 11/01/2012  . Hot flushes,  perimenopausal 11/01/2012   X-ray right ankle joint Ibuprofen 600 mg every 6 hour when necessary pain Patient has been counseled on weight bearing on the affected leg Outpatient gynecology referral for perimenopausal syndrome  Send urine for microscopy,culture and sensitivity Empiric Bactrim DS one tablet by mouth twice a day for 5 days for UTI while we await culture report  Ambrose Pancoast was given clear instructions to go  to ER or return to the clinic if symptoms don't improve, worsen or new problems develop.  Ambrose Pancoast verbalized understanding.  CLEDITH KAMIYA was told to call to get lab results if hasn't heard anything in the next week.        Jeanann Lewandowsky, MD [facility] [place], [place] [phone]   11/01/2012, 5:18 PM

## 2012-11-05 ENCOUNTER — Ambulatory Visit: Payer: Self-pay

## 2012-11-05 ENCOUNTER — Telehealth: Payer: Self-pay | Admitting: Emergency Medicine

## 2012-11-05 NOTE — Telephone Encounter (Signed)
Spoke with pt regarding UTI infection. Pt states medications Cipro and Bactrim are causing increased nausea and vomiting secondary to Methadone regimen. Need different medicine- Nonnie Done RN

## 2012-11-05 NOTE — Telephone Encounter (Signed)
Message copied by Darlis Loan on Fri Nov 05, 2012  4:40 PM ------      Message from: Jeanann Lewandowsky E      Created: Thu Nov 04, 2012  9:14 AM       Please call to inform patient that she has UTI with Escherichia coli, the Bactrim that she is taking is a good medication for this infection, but if she has no allergy to Cipro it could be a better option, left [place] no if she still has symptoms that will call in prescription for ciprofloxacin to her pharmacy. If there is no more symptoms, then continue the Bactrim ------

## 2012-11-10 NOTE — Telephone Encounter (Signed)
We can use Augmentin 875 mg PO bid for 5 days instead of cipro or bactrim. Please call in prescription for patient. Thank you

## 2012-11-11 ENCOUNTER — Telehealth: Payer: Self-pay

## 2012-11-11 NOTE — Telephone Encounter (Signed)
Pt calling reg lab results, please call back

## 2012-11-12 ENCOUNTER — Ambulatory Visit: Payer: Self-pay

## 2012-11-15 ENCOUNTER — Telehealth: Payer: Self-pay | Admitting: Emergency Medicine

## 2012-11-15 NOTE — Telephone Encounter (Signed)
Spoke with pt regarding UTI diagnosis. States she was prescribed Cipro,which made her nausea combined with taking Methadone. Pt states sx has subsided. Told her to call back if sx reoccur for different ATB treatment

## 2012-11-22 ENCOUNTER — Encounter: Payer: Self-pay | Admitting: Obstetrics & Gynecology

## 2012-11-26 ENCOUNTER — Ambulatory Visit: Payer: Self-pay

## 2012-11-30 ENCOUNTER — Ambulatory Visit: Payer: Self-pay

## 2012-12-03 ENCOUNTER — Ambulatory Visit: Payer: PRIVATE HEALTH INSURANCE

## 2012-12-03 ENCOUNTER — Ambulatory Visit: Payer: Self-pay | Attending: Internal Medicine | Admitting: Internal Medicine

## 2012-12-03 ENCOUNTER — Encounter: Payer: Self-pay | Admitting: Internal Medicine

## 2012-12-03 VITALS — BP 134/85 | HR 65 | Temp 98.4°F | Ht 64.0 in | Wt 157.0 lb

## 2012-12-03 DIAGNOSIS — IMO0001 Reserved for inherently not codable concepts without codable children: Secondary | ICD-10-CM | POA: Insufficient documentation

## 2012-12-03 DIAGNOSIS — M797 Fibromyalgia: Secondary | ICD-10-CM | POA: Insufficient documentation

## 2012-12-03 MED ORDER — PREGABALIN 75 MG PO CAPS: 75.0000 mg | ORAL_CAPSULE | Freq: Two times a day (BID) | ORAL | Status: DC

## 2012-12-03 NOTE — Progress Notes (Signed)
Patient ID: Courtney [name], female   DOB: [birth year], 42 y.o.   MRN: [id number]  CC: follow up  HPI: 42 year old female with past medical history of anxiety, substance abuse on methadone, fibromyalgia who presented to clinic for followup. Patient reports she used to be alert, to control fibromyalgia but this was stopped as her symptoms got better. She now reports symptoms are getting worse and she has generalized pain. She is also under a lot of stress. No chest pain, no shortness of breath. No abdominal pain, nausea or vomiting. No fever or chills. No hematuria or blood in the stool.  Allergies  Allergen Reactions  . Morphine And Related Itching  . Penicillins Anaphylaxis, Hives and Swelling  . Zofran Itching and Other (See Comments)    "makes me feel funny"   Past Medical History  Diagnosis Date  . DJD (degenerative joint disease)   . Scoliosis   . Kidney stones   . Anxiety   . Cancer     ovarian  . Ovarian cancer dx'd 2005  . Substance abuse     Vicodin   Current Outpatient Prescriptions on File Prior to Visit  Medication Sig Dispense Refill  . ibuprofen (ADVIL,MOTRIN) 600 MG tablet Take 1 tablet (600 mg total) by mouth every 8 (eight) hours as needed for pain.  30 tablet  0  . METHADONE HCL PO Take by mouth.      . Multiple Vitamin (MULTIVITAMIN WITH MINERALS) TABS Take 1 tablet by mouth daily.       No current facility-administered medications on file prior to visit.   Family History  Problem Relation Age of Onset  . Heart failure Mother   . Heart failure Father    History   Social History  . Marital Status: Married    Spouse Name: N/A    Number of Children: N/A  . Years of Education: N/A   Occupational History  . Not on file.   Social History Main Topics  . Smoking status: Current Every Day Smoker -- 1.00 packs/day    Types: Cigarettes  . Smokeless tobacco: Never Used  . Alcohol Use: Yes     Comment: Rarely  . Drug Use: No  . Sexual Activity: Yes    Birth  Control/ Protection: None   Other Topics Concern  . Not on file   Social History Narrative  . No narrative on file    Review of Systems  Constitutional: Negative for fever, chills, diaphoresis, activity change, appetite change and fatigue.  HENT: Negative for ear pain, nosebleeds, congestion, facial swelling, rhinorrhea, neck pain, neck stiffness and ear discharge.   Eyes: Negative for pain, discharge, redness, itching and visual disturbance.  Respiratory: Negative for cough, choking, chest tightness, shortness of breath, wheezing and stridor.   Cardiovascular: Negative for chest pain, palpitations and leg swelling.  Gastrointestinal: Negative for abdominal distention.  Genitourinary: Negative for dysuria, urgency, frequency, hematuria, flank pain, decreased urine volume, difficulty urinating and dyspareunia.  Musculoskeletal:  Positive for pain in shoulders, legs and joints Neurological: Negative for dizziness, tremors, seizures, syncope, facial asymmetry, speech difficulty, weakness, light-headedness, numbness and headaches.  Hematological: Negative for adenopathy. Does not bruise/bleed easily.  Psychiatric/Behavioral: Negative for hallucinations, behavioral problems, confusion, dysphoric mood, decreased concentration and agitation.    Objective:   Filed Vitals:   12/03/12 1434  BP: 134/85  Pulse: 65  Temp: 98.4 F (36.9 C)    Physical Exam  Constitutional: Appears well-developed and well-nourished. No distress.  HENT: Normocephalic. External  right and left ear normal. Oropharynx is clear and moist.  Eyes: Conjunctivae and EOM are normal. PERRLA, no scleral icterus.  Neck: Normal ROM. Neck supple. No JVD. No tracheal deviation. No thyromegaly.  CVS: RRR, S1/S2 +, no murmurs, no gallops, no carotid bruit.  Pulmonary: Effort and breath sounds normal, no stridor, rhonchi, wheezes, rales.  Abdominal: Soft. BS +,  no distension, tenderness, rebound or guarding.  Musculoskeletal:  Normal range of motion. No edema and no tenderness.  Lymphadenopathy: No lymphadenopathy noted, cervical, inguinal. Neuro: Alert. Normal reflexes, muscle tone coordination. No cranial nerve deficit. Skin: Skin is warm and dry. No rash noted. Not diaphoretic. No erythema. No pallor.  Psychiatric: Normal mood and affect. Behavior, judgment, thought content normal.   Lab Results  Component Value Date   WBC 10.8* 06/02/2012   HGB 15.7* 06/02/2012   HCT 44.7 06/02/2012   MCV 89.0 06/02/2012   PLT 383 06/02/2012   Lab Results  Component Value Date   CREATININE 0.70 06/02/2012   BUN 14 06/02/2012   NA 136 06/02/2012   K 3.5 06/02/2012   CL 101 06/02/2012   CO2 20 06/02/2012    No results found for this basename: HGBA1C   Lipid Panel     Component Value Date/Time   CHOL  Value: 184              08/01/2008 0520   TRIG 57 08/01/2008 0520   HDL 40 08/01/2008 0520   CHOLHDL 4.6 08/01/2008 0520   VLDL 11 08/01/2008 0520   LDLCALC  Value: 133         08/01/2008 0520       Assessment and plan:   Patient Active Problem List   Diagnosis Date Noted  . Fibromyalgia 12/03/2012    Priority: High - Prescription for Lyrica 75 mg daily provide

## 2012-12-03 NOTE — Patient Instructions (Signed)
Stress Stress-related medical problems are becoming increasingly common. The body has a built-in physical response to stressful situations. Faced with pressure, challenge or danger, we need to react quickly. Our bodies release hormones such as cortisol and adrenaline to help do this. These hormones are part of the "fight or flight" response and affect the metabolic rate, heart rate and blood pressure, resulting in a heightened, stressed state that prepares the body for optimum performance in dealing with a stressful situation. It is likely that early man required these mechanisms to stay alive, but usually modern stresses do not call for this, and the same hormones released in today's world can damage health and reduce coping ability. CAUSES  Pressure to perform at work, at school or in sports.  Threats of physical violence.  Money worries.  Arguments.  Family conflicts.  Divorce or separation from significant other.  Bereavement.  New job or unemployment.  Changes in location.  Alcohol or drug abuse. SOMETIMES, THERE IS NO PARTICULAR REASON FOR DEVELOPING STRESS. Almost all people are at risk of being stressed at some time in their lives. It is important to know that some stress is temporary and some is long term.  Temporary stress will go away when a situation is resolved. Most people can cope with short periods of stress, and it can often be relieved by relaxing, taking a walk, chatting through issues with friends, or having a good night's sleep.  Chronic (long-term, continuous) stress is much harder to deal with. It can be psychologically and emotionally damaging. It can be harmful both for an individual and for friends and family. SYMPTOMS Everyone reacts to stress differently. There are some common effects that help us recognize it. In times of extreme stress, people may:  Shake uncontrollably.  Breathe faster and deeper than normal (hyperventilate).  Vomit.  For people  with asthma, stress can trigger an attack.  For some people, stress may trigger migraine headaches, ulcers, and body pain. PHYSICAL EFFECTS OF STRESS MAY INCLUDE:  Loss of energy.  Skin problems.  Aches and pains resulting from tense muscles, including neck ache, backache and tension headaches.  Increased pain from arthritis and other conditions.  Irregular heart beat (palpitations).  Periods of irritability or anger.  Apathy or depression.  Anxiety (feeling uptight or worrying).  Unusual behavior.  Loss of appetite.  Comfort eating.  Lack of concentration.  Loss of, or decreased, sex-drive.  Increased smoking, drinking, or recreational drug use.  For women, missed periods.  Ulcers, joint pain, and muscle pain. Post-traumatic stress is the stress caused by any serious accident, strong emotional damage, or extremely difficult or violent experience such as rape or war. Post-traumatic stress victims can experience mixtures of emotions such as fear, shame, depression, guilt or anger. It may include recurrent memories or images that may be haunting. These feelings can last for weeks, months or even years after the traumatic event that triggered them. Specialized treatment, possibly with medicines and psychological therapies, is available. If stress is causing physical symptoms, severe distress or making it difficult for you to function as normal, it is worth seeing your caregiver. It is important to remember that although stress is a usual part of life, extreme or prolonged stress can lead to other illnesses that will need treatment. It is better to visit a doctor sooner rather than later. Stress has been linked to the development of high blood pressure and heart disease, as well as insomnia and depression. There is no diagnostic test for   stress since everyone reacts to it differently. But a caregiver will be able to spot the physical symptoms, such  as:  Headaches.  Shingles.  Ulcers. Emotional distress such as intense worry, low mood or irritability should be detected when the doctor asks pertinent questions to identify any underlying problems that might be the cause. In case there are physical reasons for the symptoms, the doctor may also want to do some tests to exclude certain conditions. If you feel that you are suffering from stress, try to identify the aspects of your life that are causing it. Sometimes you may not be able to change or avoid them, but even a small change can have a positive ripple effect. A simple lifestyle change can make all the difference. STRATEGIES THAT CAN HELP DEAL WITH STRESS:  Delegating or sharing responsibilities.  Avoiding confrontations.  Learning to be more assertive.  Regular exercise.  Avoid using alcohol or street drugs to cope.  Eating a healthy, balanced diet, rich in fruit and vegetables and proteins.  Finding humor or absurdity in stressful situations.  Never taking on more than you know you can handle comfortably.  Organizing your time better to get as much done as possible.  Talking to friends or family and sharing your thoughts and fears.  Listening to music or relaxation tapes.  Tensing and then relaxing your muscles, starting at the toes and working up to the head and neck. If you think that you would benefit from help, either in identifying the things that are causing your stress or in learning techniques to help you relax, see a caregiver who is capable of helping you with this. Rather than relying on medications, it is usually better to try and identify the things in your life that are causing stress and try to deal with them. There are many techniques of managing stress including counseling, psychotherapy, aromatherapy, yoga, and exercise. Your caregiver can help you determine what is best for you. Document Released: 05/17/2002 Document Revised: 05/19/2011 Document  Reviewed: 04/13/2007 [employer] Patient Information 2014 [employer].  

## 2012-12-06 ENCOUNTER — Ambulatory Visit: Payer: Self-pay

## 2012-12-08 ENCOUNTER — Telehealth: Payer: Self-pay | Admitting: Internal Medicine

## 2012-12-08 ENCOUNTER — Encounter: Payer: Self-pay | Admitting: *Deleted

## 2012-12-08 NOTE — Telephone Encounter (Signed)
Pt called regarding her medication pregabalin (LYRICA) 75 MG, Pt states that the medication cost to much and her dad was only able to pay for half of her medication which cost 79 dollars, pt would like to know if we could change the medication to a cheaper one, please contact pt

## 2012-12-09 NOTE — Telephone Encounter (Signed)
Pt in a lot of pain, says nobody has called her back, would like to know if there is an alternative. Please f/u with pt.

## 2012-12-10 ENCOUNTER — Other Ambulatory Visit: Payer: Self-pay

## 2012-12-16 ENCOUNTER — Ambulatory Visit: Payer: Self-pay

## 2012-12-27 ENCOUNTER — Emergency Department (HOSPITAL_COMMUNITY)
Admission: EM | Admit: 2012-12-27 | Discharge: 2012-12-28 | Disposition: A | Discharge status: Home / Self Care | Payer: PRIVATE HEALTH INSURANCE | Attending: Emergency Medicine | Admitting: Emergency Medicine

## 2012-12-27 ENCOUNTER — Emergency Department (HOSPITAL_COMMUNITY): Payer: PRIVATE HEALTH INSURANCE

## 2012-12-27 ENCOUNTER — Encounter (HOSPITAL_COMMUNITY): Payer: Self-pay | Admitting: Emergency Medicine

## 2012-12-27 DIAGNOSIS — S92301A Fracture of unspecified metatarsal bone(s), right foot, initial encounter for closed fracture: Secondary | ICD-10-CM

## 2012-12-27 DIAGNOSIS — Z880 Allergy status to penicillin: Secondary | ICD-10-CM | POA: Insufficient documentation

## 2012-12-27 DIAGNOSIS — S92309A Fracture of unspecified metatarsal bone(s), unspecified foot, initial encounter for closed fracture: Secondary | ICD-10-CM | POA: Insufficient documentation

## 2012-12-27 DIAGNOSIS — J069 Acute upper respiratory infection, unspecified: Secondary | ICD-10-CM | POA: Insufficient documentation

## 2012-12-27 DIAGNOSIS — Z8543 Personal history of malignant neoplasm of ovary: Secondary | ICD-10-CM | POA: Insufficient documentation

## 2012-12-27 DIAGNOSIS — Z87442 Personal history of urinary calculi: Secondary | ICD-10-CM | POA: Insufficient documentation

## 2012-12-27 DIAGNOSIS — Y929 Unspecified place or not applicable: Secondary | ICD-10-CM | POA: Insufficient documentation

## 2012-12-27 DIAGNOSIS — Y939 Activity, unspecified: Secondary | ICD-10-CM | POA: Insufficient documentation

## 2012-12-27 DIAGNOSIS — Z8739 Personal history of other diseases of the musculoskeletal system and connective tissue: Secondary | ICD-10-CM | POA: Insufficient documentation

## 2012-12-27 DIAGNOSIS — Z79899 Other long term (current) drug therapy: Secondary | ICD-10-CM | POA: Insufficient documentation

## 2012-12-27 DIAGNOSIS — F411 Generalized anxiety disorder: Secondary | ICD-10-CM | POA: Insufficient documentation

## 2012-12-27 DIAGNOSIS — X58XXXA Exposure to other specified factors, initial encounter: Secondary | ICD-10-CM | POA: Insufficient documentation

## 2012-12-27 DIAGNOSIS — F17200 Nicotine dependence, unspecified, uncomplicated: Secondary | ICD-10-CM | POA: Insufficient documentation

## 2012-12-27 HISTORY — DX: Bipolar disorder, unspecified: F31.9

## 2012-12-27 HISTORY — DX: Major depressive disorder, single episode, unspecified: F32.9

## 2012-12-27 HISTORY — DX: Depression, unspecified: F32.A

## 2012-12-27 HISTORY — DX: Fibromyalgia: M79.7

## 2012-12-27 MED ORDER — IBUPROFEN 400 MG PO TABS
600.0000 mg | ORAL_TABLET | Freq: Once | ORAL | Status: AC
  Administered 2012-12-28: 600 mg via ORAL
  Filled 2012-12-27 (×2): qty 1

## 2012-12-27 MED ORDER — IBUPROFEN 600 MG PO TABS: 600.0000 mg | ORAL_TABLET | Freq: Once | ORAL | Status: DC

## 2012-12-27 NOTE — ED Notes (Signed)
Pt states that if she were not on Methadone the pain would be unbearable

## 2012-12-27 NOTE — ED Notes (Signed)
PT. REPORTS RIGHT ANKLE PAIN RADIATING TO RIGHT UPPER LEG WITH SLIGHT SWELLING FOR 3 DAYS DENIES INJURY , AMBULATORY , ALSO REPORTED CHEST CONGESTION AND OCCASIONAL DRY COUGH.

## 2012-12-27 NOTE — ED Provider Notes (Signed)
CSN: [id number]     Arrival date & time 12/27/12  2156 History   First MD Initiated Contact with Patient 12/27/12 2332     Chief Complaint  Patient presents with  . 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 female presenting with ankle pain. The history is provided by the patient.  Ankle Pain Location:  Foot Injury: yes   Mechanism of injury comment:  Unknown Foot location:  R foot Pain details:    Quality:  Aching   Radiates to:  R leg   Severity:  Mild   Onset quality:  Unable to specify   Timing:  Constant   Progression:  Unchanged Chronicity:  New Dislocation: yes   Foreign body present:  Unable to specify Relieved by:  None tried Worsened by:  Activity Ineffective treatments:  None tried Associated symptoms: swelling   Associated symptoms: no fever   Risk factors: no concern for non-accidental trauma     Past Medical History  Diagnosis Date  . DJD (degenerative joint disease)   . Scoliosis   . Kidney stones   . Anxiety   . Cancer     ovarian  . Ovarian cancer dx'd 2005  . Substance abuse     Vicodin  . Bipolar 1 disorder   . Depression   . Fibromyalgia    Past Surgical History  Procedure Laterality Date  . Abdominal hysterectomy    . Tubal ligation     Family History  Problem Relation Age of Onset  . Heart failure Mother   . Heart failure Father    History  Substance Use Topics  . Smoking status: Current Every Day Smoker -- 1.00 packs/day    Types: Cigarettes  . Smokeless tobacco: Never Used  . Alcohol Use: Yes     Comment: Rarely   OB History   Grav Para Term Preterm Abortions TAB SAB Ect Mult Living                 Review of Systems  Constitutional: Negative for fever and chills.  HENT: Positive for voice change.  Negative for sore throat and trouble swallowing.   Respiratory: Positive for cough. Negative for shortness of breath.   Cardiovascular: Positive for leg swelling.  Skin: Negative for color change and wound.  Neurological: Negative for dizziness and weakness.  All other systems reviewed and are negative.    Allergies  Morphine and related; Penicillins; and Zofran  Home Medications   Current Outpatient Rx  Name  Route  Sig  Dispense  Refill  . acetaminophen (TYLENOL) 325 MG tablet   Oral   Take 325 mg by mouth every 6 (six) hours as needed for pain.         Marland Kitchen ibuprofen (ADVIL,MOTRIN) 600 MG tablet   Oral   Take 1 tablet (600 mg total) by mouth every 8 (eight) hours as needed for pain.   30 tablet   0   . methadone (DOLOPHINE) 10 MG/ML solution   Oral   Take 115 mg by mouth daily.         . Multiple Vitamin (MULTIVITAMIN WITH MINERALS) TABS   Oral   Take 1 tablet by mouth daily.         . pregabalin (LYRICA) 75 MG capsule   Oral   Take 1  capsule (75 mg total) by mouth 2 (two) times daily.   30 capsule   3   . ibuprofen (ADVIL,MOTRIN) 600 MG tablet   Oral   Take 1 tablet (600 mg total) by mouth once.   30 tablet   0    BP 122/76  Pulse 104  Temp(Src) 98.3 F (36.8 C) (Oral)  Resp 18  SpO2 98%  LMP 03/10/2005 Physical Exam  Nursing note and vitals reviewed. Constitutional: She appears well-developed and well-nourished. No distress.  HENT:  Head: Normocephalic and atraumatic.  Eyes: Pupils are equal, round, and reactive to light.  Neck: Normal range of motion.  Cardiovascular: Regular rhythm.   Pulmonary/Chest: Effort normal and breath sounds normal. No respiratory distress. She has no wheezes. She has no rales.  Musculoskeletal: She exhibits edema and tenderness.       Right ankle: She exhibits swelling. She exhibits no ecchymosis, no deformity and no laceration. Tenderness. Head of 5th metatarsal tenderness found.       Feet:  Neurological: She is  alert.  Skin: Skin is warm. No erythema.    ED Course  Procedures (including critical care time) Labs Review Labs Reviewed - No data to display Imaging Review Dg Chest 2 View  12/27/2012   CLINICAL DATA:  Persistent cough.  EXAM: CHEST  2 VIEW  COMPARISON:  12/02/2011  FINDINGS: The heart size and mediastinal contours are within normal limits. Both lungs are clear. The visualized skeletal structures are unremarkable.  IMPRESSION: No active cardiopulmonary disease.   Electronically Signed   By: Marlan Palau M.D.   On: 12/27/2012 22:51   Dg Ankle Complete Right  12/27/2012   CLINICAL DATA:  Pain and swelling. No injury  EXAM: RIGHT ANKLE - COMPLETE 3+ VIEW  COMPARISON:  04/24/2008  FINDINGS: Fracture the base of the 5th metatarsal appears acute.  Ankle joint is normal.  Fibrous cortical defect distal tibia.  IMPRESSION: Nondisplaced fracture proximal 5th metatarsal. This appears acute. Correlate with pain and injury.   Electronically Signed   By: Marlan Palau M.D.   On: 12/27/2012 22:52    EKG Interpretation   None       MDM   1. Fracture of 5th metatarsal, right, closed, initial encounter   2. URI (upper respiratory infection)    Will place patient in CAM walker Ibuprofen and FU with ortho as needed     Arman Filter, NP 12/27/12 2357

## 2012-12-28 NOTE — ED Provider Notes (Signed)
Medical screening examination/treatment/procedure(s) were performed by non-physician practitioner and as supervising physician I was immediately available for consultation/collaboration.   Aniesha Haughn, MD 12/28/12 0649 

## 2013-01-03 ENCOUNTER — Encounter: Payer: Self-pay | Admitting: Obstetrics & Gynecology

## 2013-01-03 ENCOUNTER — Ambulatory Visit (INDEPENDENT_AMBULATORY_CARE_PROVIDER_SITE_OTHER): Payer: Self-pay | Admitting: Obstetrics & Gynecology

## 2013-01-03 VITALS — BP 110/71 | HR 80 | Temp 97.0°F | Ht 65.0 in | Wt 165.2 lb

## 2013-01-03 DIAGNOSIS — N951 Menopausal and female climacteric states: Secondary | ICD-10-CM

## 2013-01-03 MED ORDER — ESTRADIOL 1 MG PO TABS: 1.0000 mg | ORAL_TABLET | Freq: Every day | ORAL | Status: DC

## 2013-01-03 NOTE — Progress Notes (Signed)
Subjective:     Patient ID: Courtney [name], female   DOB: Jul 13, 1970, 42 y.o.   MRN: [id number]  HPI Pt reports having a TAH with BSO in 2003 or 2004 for endometriosis.  She reports that she was on ERT previously with no sx but, had to stop due to lack of insurance.  She was prev on a EES patch as well.  She now reports that she has been off meds for 4-5 years and c/o dyspareunia, hot flushes and mood changes.  She wants to restart EES.  Past Medical History  Diagnosis Date  . DJD (degenerative joint disease)   . Scoliosis   . Kidney stones   . Anxiety   . Cancer     ovarian  . Ovarian cancer dx'd 2005  . Substance abuse     Vicodin  . Bipolar 1 disorder   . Depression   . Fibromyalgia    Past Surgical History  Procedure Laterality Date  . Abdominal hysterectomy    . Tubal ligation     Current Outpatient Prescriptions on File Prior to Visit  Medication Sig Dispense Refill  . acetaminophen (TYLENOL) 325 MG tablet Take 325 mg by mouth every 6 (six) hours as needed for pain.      Marland Kitchen ibuprofen (ADVIL,MOTRIN) 600 MG tablet Take 1 tablet (600 mg total) by mouth once.  30 tablet  0  . methadone (DOLOPHINE) 10 MG/ML solution Take 115 mg by mouth daily.      . Multiple Vitamin (MULTIVITAMIN WITH MINERALS) TABS Take 1 tablet by mouth daily.      . pregabalin (LYRICA) 75 MG capsule Take 1 capsule (75 mg total) by mouth 2 (two) times daily.  30 capsule  3   No current facility-administered medications on file prior to visit.   Allergies  Allergen Reactions  . Morphine And Related Itching  . Penicillins Anaphylaxis, Hives and Swelling  . Zofran Itching and Other (See Comments)    "makes me feel funny"   History   Social History  . Marital Status: Married    Spouse Name: N/A    Number of Children: N/A  . Years of Education: N/A   Occupational History  . Not on file.   Social History Main Topics  . Smoking status: Current Every Day Smoker -- 1.00 packs/day    Types: Cigarettes   . Smokeless tobacco: Never Used  . Alcohol Use: Yes     Comment: Rarely  . Drug Use: No  . Sexual Activity: Yes    Birth Control/ Protection: None   Other Topics Concern  . Not on file   Social History Narrative  . No narrative on file     Review of Systems     Objective:   Physical Exam BP 110/71  Pulse 80  Temp(Src) 97 F (36.1 C) (Oral)  Ht 5\' 5"  (1.651 m)  Wt 165 lb 3.2 oz (74.934 kg)  BMI 27.49 kg/m2  LMP 03/10/2005  Pt in NAD GU: EGBUS: no lesions Vagina: no blood in vault; cuff well healed Adnexa: no masses; non tender         Assessment:     Menopausal sx pt wants to start ERT     Plan:     Estridiol 1mg  po q day F/u 1 year revewed with pt risks of ERT.  Handout given as well

## 2013-01-03 NOTE — Patient Instructions (Addendum)
Menopause Menopause is the normal time of life when menstrual periods stop completely. Menopause is complete when you have missed 12 consecutive menstrual periods. It usually occurs between the ages of 48 to 55, with an average age of 51. Very rarely does a woman develop menopause before 40 years old. At menopause, your ovaries stop producing the female hormones, estrogen and progesterone. This can cause undesirable symptoms and also affect your health. Sometimes the symptoms may occur 4 to 5 years before the menopause begins. There is no relationship between menopause and:  Oral contraceptives.  Number of children you had.  Race.  The age your menstrual periods started (menarche). Heavy smokers and very thin women may develop menopause earlier in life. CAUSES  The ovaries stop producing the female hormones estrogen and progesterone.  Other causes include:  Surgery to remove both ovaries.  The ovaries stop functioning for no known reason.  Tumors of the pituitary gland in the brain.  Medical disease that affects the ovaries and hormone production.  Radiation treatment to the abdomen or pelvis.  Chemotherapy that affects the ovaries. SYMPTOMS   Hot flashes.  Night sweats.  Decrease in sex drive.  Vaginal dryness and thinning of the vagina causing painful intercourse.  Dryness of the skin and developing wrinkles.  Headaches.  Tiredness.  Irritability.  Memory problems.  Weight gain.  Bladder infections.  Hair growth of the face and chest.  Infertility. More serious symptoms include:  Loss of bone (osteoporosis) causing breaks (fractures).  Depression.  Hardening and narrowing of the arteries (atherosclerosis) causing heart attacks and strokes. DIAGNOSIS   When the menstrual periods have stopped for 12 straight months.  Physical exam.  Hormone studies of the blood. TREATMENT  There are many treatment choices and nearly as many questions about them.  The decisions to treat or not to treat menopausal changes is an individual choice made with your caregiver. Your caregiver can discuss the treatments with you. Together, you can decide which treatment will work best for you. Your treatment choices may include:   Hormone therapy (estorgen and progesterone).  Non-hormonal medications.  Treating the individual symptoms with medication (for example antidepressants for depression).  Herbal medications that may help specific symptoms.  Counseling by a psychiatrist or psychologist.  Group therapy.  Lifestyle changes including:  Eating healthy.  Regular exercise.  Limiting caffeine and alcohol.  Stress management and meditation.  No treatment. HOME CARE INSTRUCTIONS   Take the medication your caregiver gives you as directed.  Get plenty of sleep and rest.  Exercise regularly.  Eat a diet that contains calcium (good for the bones) and soy products (acts like estrogen hormone).  Avoid alcoholic beverages.  Do not smoke.  If you have hot flashes, dress in layers.  Take supplements, calcium and vitamin D to strengthen bones.  You can use over-the-counter lubricants or moisturizers for vaginal dryness.  Group therapy is sometimes very helpful.  Acupuncture may be helpful in some cases. SEEK MEDICAL CARE IF:   You are not sure you are in menopause.  You are having menopausal symptoms and need advice and treatment.  You are still having menstrual periods after age 55.  You have pain with intercourse.  Menopause is complete (no menstrual period for 12 months) and you develop vaginal bleeding.  You need a referral to a specialist (gynecologist, psychiatrist or psychologist) for treatment. SEEK IMMEDIATE MEDICAL CARE IF:   You have severe depression.  You have excessive vaginal bleeding.  You fell and   think you have a broken bone.  You have pain when you urinate.  You develop leg or chest pain.  You have a fast  pounding heart beat (palpitations).  You have severe headaches.  You develop vision problems.  You feel a lump in your breast.  You have abdominal pain or severe indigestion. Document Released: 05/17/2003 Document Revised: 05/19/2011 Document Reviewed: 12/23/2007 [facility] Patient Information 2014 [place], [place]. Hormone Therapy At menopause, your body begins making less estrogen and progesterone hormones. This causes the body to stop having menstrual periods. This is because estrogen and progesterone hormones control your periods and menstrual cycle. A lack of estrogen may cause symptoms such as:  Hot flushes (or hot flashes).  Vaginal dryness.  Dry skin.  Loss of sex drive.  Risk of bone loss (osteoporosis). When this happens, you may choose to take hormone therapy to get back the estrogen lost during menopause. When the hormone estrogen is given alone, it is usually referred to as ET (Estrogen Therapy). When the hormone progestin is combined with estrogen, it is generally called HT (Hormone Therapy). This was formerly known as hormone replacement therapy (HRT). Your caregiver can help you make a decision on what will be best for you. The decision to use HT seems to change often as new studies are done. Many studies do not agree on the benefits of hormone replacement therapy. LIKELY BENEFITS OF HT INCLUDE PROTECTION FROM:  Hot Flushes (also called hot flashes) - A hot flush is a sudden feeling of heat that spreads over the face and body. The skin may redden like a blush. It is connected with sweats and sleep disturbance. Women going through menopause may have hot flushes a few times a month or several times per day depending on the woman.  Osteoporosis (bone loss)- Estrogen helps guard against bone loss. After menopause, a woman's bones slowly lose calcium and become weak and brittle. As a result, bones are more likely to break. The hip, wrist, and spine are affected most often. Hormone  therapy can help slow bone loss after menopause. Weight bearing exercise and taking calcium with vitamin D also can help prevent bone loss. There are also medications that your caregiver can prescribe that can help prevent osteoporosis.  Vaginal Dryness - Loss of estrogen causes changes in the vagina. Its lining may become thin and dry. These changes can cause pain and bleeding during sexual intercourse. Dryness can also lead to infections. This can cause burning and itching. (Vaginal estrogen treatment can help relieve pain, itching, and dryness.)  Urinary Tract Infections are more common after menopause because of lack of estrogen. Some women also develop urinary incontinence because of low estrogen levels in the vagina and bladder.  Possible other benefits of estrogen include a positive effect on mood and short-term memory in women. RISKS AND COMPLICATIONS  Using estrogen alone without progesterone causes the lining of the uterus to grow. This increases the risk of lining of the uterus (endometrial) cancer. Your caregiver should give another hormone called progestin if you have a uterus.  Women who take combined (estrogen and progestin) HT appear to have an increased risk of breast cancer. The risk appears to be small, but increases throughout the time that HT is taken.  Combined therapy also makes the breast tissue slightly denser which makes it harder to read mammograms (breast X-rays).  Combined, estrogen and progesterone therapy can be taken together every day, in which case there may be spotting of blood. HT therapy can  be taken cyclically in which case you will have menstrual periods. Cyclically means HT is taken for a set amount of days, then not taken, then this process is repeated.  HT may increase the risk of stroke, heart attack, breast cancer and forming blood clots in your leg.  Transdermal estrogen (estrogen that is absorbed through the skin with a patch or a cream) may have more  positive results with:  Cholesterol.  Blood pressure.  Blood clots. Having the following conditions may indicate you should not have HT:  Endometrial cancer.  Liver disease.  Breast cancer.  Heart disease.  History of blood clots.  Stroke. TREATMENT   If you choose to take HT and have a uterus, usually estrogen and progestin are prescribed.  Your caregiver will help you decide the best way to take the medications.  Possible ways to take estrogen include:  Pills.  Patches.  Gels.  Sprays.  Vaginal estrogen cream, rings and tablets.  It is best to take the lowest dose possible that will help your symptoms and take them for the shortest period of time that you can.  Hormone therapy can help relieve some of the problems (symptoms) that affect women at menopause. Before making a decision about HT, talk to your caregiver about what is best for you. Be well informed and comfortable with your decisions. HOME CARE INSTRUCTIONS   Follow your caregivers advice when taking the medications.  A Pap test is done to screen for cervical cancer.  The first Pap test should be done at age 66.  Between ages 64 and 70, Pap tests are repeated every 2 years.  Beginning at age 3, you are advised to have a Pap test every 3 years as long as your past 3 Pap tests have been normal.  Some women have medical problems that increase the chance of getting cervical cancer. Talk to your caregiver about these problems. It is especially important to talk to your caregiver if a new problem develops soon after your last Pap test. In these cases, your caregiver may recommend more frequent screening and Pap tests.  The above recommendations are the same for women who have or have not gotten the vaccine for HPV (Human Papillomavirus).  If you had a hysterectomy for a problem that was not a cancer or a condition that could lead to cancer, then you no longer need Pap tests. However, even if you no  longer need a Pap test, a regular exam is a good idea to make sure no other problems are starting.   If you are between ages 76 and 90, and you have had normal Pap tests going back 10 years, you no longer need Pap tests. However, even if you no longer need a Pap test, a regular exam is a good idea to make sure no other problems are starting.   If you have had past treatment for cervical cancer or a condition that could lead to cancer, you need Pap tests and screening for cancer for at least 20 years after your treatment.  If Pap tests have been discontinued, risk factors (such as a new sexual partner) need to be re-assessed to determine if screening should be resumed.  Some women may need screenings more often if they are at high risk for cervical cancer.  Get mammograms done as per the advice of your caregiver. SEEK IMMEDIATE MEDICAL CARE IF:  You develop abnormal vaginal bleeding.  You have pain or swelling in your legs, shortness of  breath, or chest pain.  You develop dizziness or headaches.  You have lumps or changes in your breasts or armpits.  You have slurred speech.  You develop weakness or numbness of your arms or legs.  You have pain, burning, or bleeding when urinating.  You develop abdominal pain. Document Released: 11/23/2002 Document Revised: 05/19/2011 Document Reviewed: 03/13/2010 [facility] Patient Information 2014 [place], [place].

## 2013-03-01 ENCOUNTER — Ambulatory Visit: Payer: Self-pay

## 2013-03-02 ENCOUNTER — Ambulatory Visit: Payer: Self-pay | Admitting: Internal Medicine

## 2013-03-08 ENCOUNTER — Ambulatory Visit: Payer: Self-pay

## 2013-03-16 ENCOUNTER — Encounter: Payer: Self-pay | Admitting: Internal Medicine

## 2013-03-16 ENCOUNTER — Ambulatory Visit: Payer: Self-pay | Attending: Internal Medicine | Admitting: Internal Medicine

## 2013-03-16 VITALS — BP 114/85 | HR 95 | Temp 98.9°F | Resp 14 | Ht 65.0 in | Wt 157.6 lb

## 2013-03-16 DIAGNOSIS — F411 Generalized anxiety disorder: Secondary | ICD-10-CM | POA: Insufficient documentation

## 2013-03-16 DIAGNOSIS — F17200 Nicotine dependence, unspecified, uncomplicated: Secondary | ICD-10-CM | POA: Insufficient documentation

## 2013-03-16 DIAGNOSIS — R002 Palpitations: Secondary | ICD-10-CM | POA: Insufficient documentation

## 2013-03-16 DIAGNOSIS — F1910 Other psychoactive substance abuse, uncomplicated: Secondary | ICD-10-CM

## 2013-03-16 DIAGNOSIS — N951 Menopausal and female climacteric states: Secondary | ICD-10-CM

## 2013-03-16 MED ORDER — ATENOLOL 25 MG PO TABS: 12.5000 mg | ORAL_TABLET | Freq: Every day | ORAL | Status: DC

## 2013-03-16 MED ORDER — ESTRADIOL 1 MG PO TABS: 1.0000 mg | ORAL_TABLET | Freq: Every day | ORAL | Status: DC

## 2013-03-16 MED ORDER — NICOTINE 14 MG/24HR TD PT24: 14.0000 mg | MEDICATED_PATCH | Freq: Every day | TRANSDERMAL | Status: DC

## 2013-03-16 NOTE — Progress Notes (Signed)
Patient ID: Courtney [name], female   DOB: Jul 07, 1970, 43 y.o.   MRN: [id number]   CC:  HPI: 43 year old female, comes to the clinic for evaluation of her anxiety. She sees a psychiatrist and Beverly Sessions will have prescribed her Lexapro and Prozac. The patient states that her father is in hospice, she recently lost her sister, she has to deal with 5 kids at home. She wakes up in the middle of the night with palpitations and chest pain, feels like she is going to die She refuses to go to the psych ED UDS today in the clinic is pending    Allergies  Allergen Reactions  . Morphine And Related Itching  . Penicillins Anaphylaxis, Hives and Swelling  . Zofran Itching and Other (See Comments)    "makes me feel funny"   Past Medical History  Diagnosis Date  . DJD (degenerative joint disease)   . Scoliosis   . Kidney stones   . Anxiety   . Cancer     ovarian  . Ovarian cancer dx'd 2005  . Substance abuse     Vicodin  . Bipolar 1 disorder   . Depression   . Fibromyalgia    Current Outpatient Prescriptions on File Prior to Visit  Medication Sig Dispense Refill  . methadone (DOLOPHINE) 10 MG/ML solution Take 115 mg by mouth daily.      Marland Kitchen acetaminophen (TYLENOL) 325 MG tablet Take 325 mg by mouth every 6 (six) hours as needed for pain.      Marland Kitchen ibuprofen (ADVIL,MOTRIN) 600 MG tablet Take 1 tablet (600 mg total) by mouth once.  30 tablet  0  . Multiple Vitamin (MULTIVITAMIN WITH MINERALS) TABS Take 1 tablet by mouth daily.      . pregabalin (LYRICA) 75 MG capsule Take 1 capsule (75 mg total) by mouth 2 (two) times daily.  30 capsule  3   No current facility-administered medications on file prior to visit.   Family History  Problem Relation Age of Onset  . Heart failure Mother   . Heart failure Father    History   Social History  . Marital Status: Married    Spouse Name: N/A    Number of Children: N/A  . Years of Education: N/A   Occupational History  . Not on file.   Social  History Main Topics  . Smoking status: Current Every Day Smoker -- 1.00 packs/day    Types: Cigarettes  . Smokeless tobacco: Never Used  . Alcohol Use: Yes     Comment: Rarely  . Drug Use: No  . Sexual Activity: Yes    Birth Control/ Protection: None   Other Topics Concern  . Not on file   Social History Narrative  . No narrative on file    Review of Systems  Constitutional: Negative for fever, chills, diaphoresis, activity change, appetite change and fatigue.  HENT: Negative for ear pain, nosebleeds, congestion, facial swelling, rhinorrhea, neck pain, neck stiffness and ear discharge.   Eyes: Negative for pain, discharge, redness, itching and visual disturbance.  Respiratory: Negative for cough, choking, chest tightness, shortness of breath, wheezing and stridor.   Cardiovascular: Negative for chest pain, palpitations and leg swelling.  Gastrointestinal: Negative for abdominal distention.  Genitourinary: Negative for dysuria, urgency, frequency, hematuria, flank pain, decreased urine volume, difficulty urinating and dyspareunia.  Musculoskeletal: Negative for back pain, joint swelling, arthralgias and gait problem.  Neurological: Negative for dizziness, tremors, seizures, syncope, facial asymmetry, speech difficulty, weakness, light-headedness, numbness and headaches.  Hematological: Negative for adenopathy. Does not bruise/bleed easily.  Psychiatric/Behavioral: Negative for hallucinations, behavioral problems, confusion, dysphoric mood, decreased concentration and agitation.    Objective:   Filed Vitals:   03/16/13 1657  BP: 114/85  Pulse: 95  Temp: 98.9 F (37.2 C)  Resp: 14    Physical Exam  Constitutional: As in history of present illness HENT: Normocephalic. External right and left ear normal. Oropharynx is clear and moist.  Eyes: Conjunctivae and EOM are normal. PERRLA, no scleral icterus.  Neck: Normal ROM. Neck supple. No JVD. No tracheal deviation. No  thyromegaly.  CVS: RRR, S1/S2 +, no murmurs, no gallops, no carotid bruit.  Pulmonary: Effort and breath sounds normal, no stridor, rhonchi, wheezes, rales.  Abdominal: Soft. BS +,  no distension, tenderness, rebound or guarding.  Musculoskeletal: Normal range of motion. No edema and no tenderness.  Lymphadenopathy: No lymphadenopathy noted, cervical, inguinal. Neuro: Alert. Normal reflexes, muscle tone coordination. No cranial nerve deficit. Skin: Skin is warm and dry. No rash noted. Not diaphoretic. No erythema. No pallor.  Psychiatric: As in history of present illness Lab Results  Component Value Date   WBC 10.8* 06/02/2012   HGB 15.7* 06/02/2012   HCT 44.7 06/02/2012   MCV 89.0 06/02/2012   PLT 383 06/02/2012   Lab Results  Component Value Date   CREATININE 0.70 06/02/2012   BUN 14 06/02/2012   NA 136 06/02/2012   K 3.5 06/02/2012   CL 101 06/02/2012   CO2 20 06/02/2012    No results found for this basename: HGBA1C   Lipid Panel     Component Value Date/Time   CHOL  Value: 184        ATP III CLASSIFICATION:  <200     mg/dL   Desirable  200-239  mg/dL   Borderline High  >=240    mg/dL   High        08/01/2008 0520   TRIG 57 08/01/2008 0520   HDL 40 08/01/2008 0520   CHOLHDL 4.6 08/01/2008 0520   VLDL 11 08/01/2008 0520   LDLCALC  Value: 133        Total Cholesterol/HDL:CHD Risk Coronary Heart Disease Risk Table                     Men   Women  1/2 Average Risk   3.4   3.3  Average Risk       5.0   4.4  2 X Average Risk   9.6   7.1  3 X Average Risk  23.4   11.0        Use the calculated Patient Ratio above and the CHD Risk Table to determine the patient's CHD Risk.        ATP III CLASSIFICATION (LDL):  <100     mg/dL   Optimal  100-129  mg/dL   Near or Above                    Optimal  130-159  mg/dL   Borderline  160-189  mg/dL   High  >190     mg/dL   Very High* 08/01/2008 0520       Assessment and plan:   Patient Active Problem List   Diagnosis Date Noted  . Fibromyalgia 12/03/2012   . Anxiety state, unspecified 11/01/2012  . Hot flushes, perimenopausal 11/01/2012   Anxiety Told the patient that we do not prescribe benzodiazepines    Urine drug screen has  been consistently positive for benzodiazepines In fact the patient states that she borrowed her aunt's benzodiazepine to help her with her anxiety She refuses to go to the psyche I have given her an alternative referral for psychiatry   , Palpitations she is agreeable to try beta blocker We'll prescribe low-dose atenolol 12.5 mg   Nicotine dependence Smoking cessation counseling done and nicotine patch provided  The patient was given clear instructions to go to ER or return to medical center if symptoms don't improve, worsen or new problems develop. The patient verbalized understanding. The patient was told to call to get any lab results if not heard anything in the next week.

## 2013-03-16 NOTE — Progress Notes (Signed)
Pt is here for a 3 month f/u. Requests nicotine patches to stop smoking. Also complains of having anxiety attacks. Pt is shaking very badly, redness of the eyes, having trouble sleepying and feeling of weight on chest. Causes pt to try to catch her breath.

## 2013-05-01 ENCOUNTER — Emergency Department (HOSPITAL_COMMUNITY)
Admission: EM | Admit: 2013-05-01 | Discharge: 2013-05-01 | Disposition: A | Discharge status: Home / Self Care | Payer: PRIVATE HEALTH INSURANCE | Attending: Emergency Medicine | Admitting: Emergency Medicine

## 2013-05-01 ENCOUNTER — Emergency Department (HOSPITAL_COMMUNITY): Payer: PRIVATE HEALTH INSURANCE

## 2013-05-01 ENCOUNTER — Encounter (HOSPITAL_COMMUNITY): Payer: Self-pay | Admitting: Emergency Medicine

## 2013-05-01 DIAGNOSIS — Z87442 Personal history of urinary calculi: Secondary | ICD-10-CM | POA: Insufficient documentation

## 2013-05-01 DIAGNOSIS — R109 Unspecified abdominal pain: Secondary | ICD-10-CM | POA: Insufficient documentation

## 2013-05-01 DIAGNOSIS — F329 Major depressive disorder, single episode, unspecified: Secondary | ICD-10-CM | POA: Insufficient documentation

## 2013-05-01 DIAGNOSIS — Z79899 Other long term (current) drug therapy: Secondary | ICD-10-CM | POA: Insufficient documentation

## 2013-05-01 DIAGNOSIS — R112 Nausea with vomiting, unspecified: Secondary | ICD-10-CM | POA: Insufficient documentation

## 2013-05-01 DIAGNOSIS — Z8739 Personal history of other diseases of the musculoskeletal system and connective tissue: Secondary | ICD-10-CM | POA: Insufficient documentation

## 2013-05-01 DIAGNOSIS — Z880 Allergy status to penicillin: Secondary | ICD-10-CM | POA: Insufficient documentation

## 2013-05-01 DIAGNOSIS — F3289 Other specified depressive episodes: Secondary | ICD-10-CM | POA: Insufficient documentation

## 2013-05-01 DIAGNOSIS — Z8543 Personal history of malignant neoplasm of ovary: Secondary | ICD-10-CM | POA: Insufficient documentation

## 2013-05-01 DIAGNOSIS — F17200 Nicotine dependence, unspecified, uncomplicated: Secondary | ICD-10-CM | POA: Insufficient documentation

## 2013-05-01 DIAGNOSIS — R300 Dysuria: Secondary | ICD-10-CM | POA: Insufficient documentation

## 2013-05-01 DIAGNOSIS — F411 Generalized anxiety disorder: Secondary | ICD-10-CM | POA: Insufficient documentation

## 2013-05-01 DIAGNOSIS — Z90710 Acquired absence of both cervix and uterus: Secondary | ICD-10-CM | POA: Insufficient documentation

## 2013-05-01 DIAGNOSIS — R197 Diarrhea, unspecified: Secondary | ICD-10-CM | POA: Insufficient documentation

## 2013-05-01 LAB — CBC WITH DIFFERENTIAL/PLATELET
Basophils Absolute: 0 10*3/uL (ref 0.0–0.1)
Basophils Relative: 0 % (ref 0–1)
EOS ABS: 0 10*3/uL (ref 0.0–0.7)
EOS PCT: 0 % (ref 0–5)
HCT: 43.5 % (ref 36.0–46.0)
Hemoglobin: 14.5 g/dL (ref 12.0–15.0)
Lymphocytes Relative: 18 % (ref 12–46)
Lymphs Abs: 2.2 10*3/uL (ref 0.7–4.0)
MCH: 30.3 pg (ref 26.0–34.0)
MCHC: 33.3 g/dL (ref 30.0–36.0)
MCV: 91 fL (ref 78.0–100.0)
Monocytes Absolute: 0.3 10*3/uL (ref 0.1–1.0)
Monocytes Relative: 3 % (ref 3–12)
NEUTROS PCT: 79 % — AB (ref 43–77)
Neutro Abs: 9.8 10*3/uL — ABNORMAL HIGH (ref 1.7–7.7)
PLATELETS: 230 10*3/uL (ref 150–400)
RBC: 4.78 MIL/uL (ref 3.87–5.11)
RDW: 12 % (ref 11.5–15.5)
WBC: 12.3 10*3/uL — ABNORMAL HIGH (ref 4.0–10.5)

## 2013-05-01 LAB — COMPREHENSIVE METABOLIC PANEL
ALBUMIN: 4.3 g/dL (ref 3.5–5.2)
ALT: 92 U/L — AB (ref 0–35)
AST: 64 U/L — ABNORMAL HIGH (ref 0–37)
Alkaline Phosphatase: 70 U/L (ref 39–117)
BILIRUBIN TOTAL: 0.3 mg/dL (ref 0.3–1.2)
BUN: 11 mg/dL (ref 6–23)
CO2: 24 mEq/L (ref 19–32)
Calcium: 9.8 mg/dL (ref 8.4–10.5)
Chloride: 102 mEq/L (ref 96–112)
Creatinine, Ser: 0.64 mg/dL (ref 0.50–1.10)
GFR calc Af Amer: >90 mL/min (ref >90)
GFR calc non Af Amer: >90 mL/min (ref >90)
Glucose, Bld: 134 mg/dL — ABNORMAL HIGH (ref 70–99)
POTASSIUM: 3.8 meq/L (ref 3.7–5.3)
SODIUM: 140 meq/L (ref 137–147)
TOTAL PROTEIN: 8.1 g/dL (ref 6.0–8.3)

## 2013-05-01 LAB — URINALYSIS, ROUTINE W REFLEX MICROSCOPIC
BILIRUBIN URINE: NEGATIVE
Glucose, UA: NEGATIVE mg/dL
Hgb urine dipstick: NEGATIVE
KETONES UR: NEGATIVE mg/dL
Leukocytes, UA: NEGATIVE
Nitrite: NEGATIVE
Protein, ur: NEGATIVE mg/dL
Specific Gravity, Urine: 1.023 (ref 1.005–1.030)
UROBILINOGEN UA: 0.2 mg/dL (ref 0.0–1.0)
pH: 8 (ref 5.0–8.0)

## 2013-05-01 MED ORDER — IOHEXOL 300 MG/ML  SOLN
100.0000 mL | Freq: Once | INTRAMUSCULAR | Status: AC | PRN
  Administered 2013-05-01: 100 mL via INTRAVENOUS

## 2013-05-01 MED ORDER — PROMETHAZINE HCL 25 MG PO TABS: 25.0000 mg | ORAL_TABLET | Freq: Four times a day (QID) | ORAL | Status: DC | PRN

## 2013-05-01 MED ORDER — METOCLOPRAMIDE HCL 5 MG/ML IJ SOLN
10.0000 mg | Freq: Once | INTRAMUSCULAR | Status: AC
  Administered 2013-05-01: 10 mg via INTRAVENOUS
  Filled 2013-05-01: qty 2

## 2013-05-01 MED ORDER — KETOROLAC TROMETHAMINE 30 MG/ML IJ SOLN
30.0000 mg | Freq: Once | INTRAMUSCULAR | Status: AC
Start: 2013-05-01 — End: 2013-05-01
  Administered 2013-05-01: 30 mg via INTRAVENOUS
  Filled 2013-05-01: qty 2

## 2013-05-01 MED ORDER — IBUPROFEN 800 MG PO TABS: 800.0000 mg | ORAL_TABLET | Freq: Three times a day (TID) | ORAL | Status: DC | PRN

## 2013-05-01 MED ORDER — HYDROMORPHONE HCL PF 1 MG/ML IJ SOLN
1.0000 mg | Freq: Once | INTRAMUSCULAR | Status: AC
  Administered 2013-05-01: 1 mg via INTRAVENOUS
  Filled 2013-05-01: qty 1

## 2013-05-01 MED ORDER — MORPHINE SULFATE 4 MG/ML IJ SOLN: 4.0000 mg | Freq: Once | INTRAMUSCULAR | Status: DC

## 2013-05-01 MED ORDER — PROMETHAZINE HCL 25 MG/ML IJ SOLN
25.0000 mg | Freq: Once | INTRAMUSCULAR | Status: AC
  Administered 2013-05-01: 25 mg via INTRAVENOUS
  Filled 2013-05-01: qty 1

## 2013-05-01 MED ORDER — IOHEXOL 300 MG/ML  SOLN
50.0000 mL | Freq: Once | INTRAMUSCULAR | Status: AC | PRN
  Administered 2013-05-01: 50 mL via ORAL

## 2013-05-01 MED ORDER — SODIUM CHLORIDE 0.9 % IV BOLUS (SEPSIS)
1000.0000 mL | Freq: Once | INTRAVENOUS | Status: AC
  Administered 2013-05-01: 1000 mL via INTRAVENOUS

## 2013-05-01 NOTE — ED Provider Notes (Signed)
Medical screening examination/treatment/procedure(s) were performed by non-physician practitioner and as supervising physician I was immediately available for consultation/collaboration.  EKG Interpretation   None         Megan E Docherty, MD 05/01/13 1727 

## 2013-05-01 NOTE — Discharge Instructions (Signed)
Read the information below.  Use the prescribed medication as directed.  Please discuss all new medications with your pharmacist.  You may return to the Emergency Department at any time for worsening condition or any new symptoms that concern you.  If you develop high fevers, worsening abdominal pain, uncontrolled vomiting, or are unable to tolerate fluids by mouth, return to the ER for a recheck.     Nausea and Vomiting Nausea is a sick feeling that often comes before throwing up (vomiting). Vomiting is a reflex where stomach contents come out of your mouth. Vomiting can cause severe loss of body fluids (dehydration). Children and elderly adults can become dehydrated quickly, especially if they also have diarrhea. Nausea and vomiting are symptoms of a condition or disease. It is important to find the cause of your symptoms. CAUSES   Direct irritation of the stomach lining. This irritation can result from increased acid production (gastroesophageal reflux disease), infection, food poisoning, taking certain medicines (such as nonsteroidal anti-inflammatory drugs), alcohol use, or tobacco use.  Signals from the brain.These signals could be caused by a headache, heat exposure, an inner ear disturbance, increased pressure in the brain from injury, infection, a tumor, or a concussion, pain, emotional stimulus, or metabolic problems.  An obstruction in the gastrointestinal tract (bowel obstruction).  Illnesses such as diabetes, hepatitis, gallbladder problems, appendicitis, kidney problems, cancer, sepsis, atypical symptoms of a heart attack, or eating disorders.  Medical treatments such as chemotherapy and radiation.  Receiving medicine that makes you sleep (general anesthetic) during surgery. DIAGNOSIS Your caregiver may ask for tests to be done if the problems do not improve after a few days. Tests may also be done if symptoms are severe or if the reason for the nausea and vomiting is not clear.  Tests may include:  Urine tests.  Blood tests.  Stool tests.  Cultures (to look for evidence of infection).  X-rays or other imaging studies. Test results can help your caregiver make decisions about treatment or the need for additional tests. TREATMENT You need to stay well hydrated. Drink frequently but in small amounts.You may wish to drink water, sports drinks, clear broth, or eat frozen ice pops or gelatin dessert to help stay hydrated.When you eat, eating slowly may help prevent nausea.There are also some antinausea medicines that may help prevent nausea. HOME CARE INSTRUCTIONS   Take all medicine as directed by your caregiver.  If you do not have an appetite, do not force yourself to eat. However, you must continue to drink fluids.  If you have an appetite, eat a normal diet unless your caregiver tells you differently.  Eat a variety of complex carbohydrates (rice, wheat, potatoes, bread), lean meats, yogurt, fruits, and vegetables.  Avoid high-fat foods because they are more difficult to digest.  Drink enough water and fluids to keep your urine clear or pale yellow.  If you are dehydrated, ask your caregiver for specific rehydration instructions. Signs of dehydration may include:  Severe thirst.  Dry lips and mouth.  Dizziness.  Dark urine.  Decreasing urine frequency and amount.  Confusion.  Rapid breathing or pulse. SEEK IMMEDIATE MEDICAL CARE IF:   You have blood or brown flecks (like coffee grounds) in your vomit.  You have black or bloody stools.  You have a severe headache or stiff neck.  You are confused.  You have severe abdominal pain.  You have chest pain or trouble breathing.  You do not urinate at least once every 8 hours.  You develop cold or clammy skin.  You continue to vomit for longer than 24 to 48 hours.  You have a fever. MAKE SURE YOU:   Understand these instructions.  Will watch your condition.  Will get help right  away if you are not doing well or get worse. Document Released: 02/24/2005 Document Revised: 05/19/2011 Document Reviewed: 07/24/2010 [facility] Patient Information 2014 [place], [place].  Diarrhea Diarrhea is frequent loose and watery bowel movements. It can cause you to feel weak and dehydrated. Dehydration can cause you to become tired and thirsty, have a dry mouth, and have decreased urination that often is dark yellow. Diarrhea is a sign of another problem, most often an infection that will not last long. In most cases, diarrhea typically lasts 2 3 days. However, it can last longer if it is a sign of something more serious. It is important to treat your diarrhea as directed by your caregive to lessen or prevent future episodes of diarrhea. CAUSES  Some common causes include:  Gastrointestinal infections caused by viruses, bacteria, or parasites.  Food poisoning or food allergies.  Certain medicines, such as antibiotics, chemotherapy, and laxatives.  Artificial sweeteners and fructose.  Digestive disorders. HOME CARE INSTRUCTIONS  Ensure adequate fluid intake (hydration): have 1 cup (8 oz) of fluid for each diarrhea episode. Avoid fluids that contain simple sugars or sports drinks, fruit juices, whole milk products, and sodas. Your urine should be clear or pale yellow if you are drinking enough fluids. Hydrate with an oral rehydration solution that you can purchase at pharmacies, retail stores, and online. You can prepare an oral rehydration solution at home by mixing the following ingredients together:    tsp table salt.   tsp baking soda.   tsp salt substitute containing potassium chloride.  1  tablespoons sugar.  1 L (34 oz) of water.  Certain foods and beverages may increase the speed at which food moves through the gastrointestinal (GI) tract. These foods and beverages should be avoided and include:  Caffeinated and alcoholic beverages.  High-fiber foods, such as raw fruits  and vegetables, nuts, seeds, and whole grain breads and cereals.  Foods and beverages sweetened with sugar alcohols, such as xylitol, sorbitol, and mannitol.  Some foods may be well tolerated and may help thicken stool including:  Starchy foods, such as rice, toast, pasta, low-sugar cereal, oatmeal, grits, baked potatoes, crackers, and bagels.  Bananas.  Applesauce.  Add probiotic-rich foods to help increase healthy bacteria in the GI tract, such as yogurt and fermented milk products.  Wash your hands well after each diarrhea episode.  Only take over-the-counter or prescription medicines as directed by your caregiver.  Take a warm bath to relieve any burning or pain from frequent diarrhea episodes. SEEK IMMEDIATE MEDICAL CARE IF:   You are unable to keep fluids down.  You have persistent vomiting.  You have blood in your stool, or your stools are black and tarry.  You do not urinate in 6 8 hours, or there is only a small amount of very dark urine.  You have abdominal pain that increases or localizes.  You have weakness, dizziness, confusion, or lightheadedness.  You have a severe headache.  Your diarrhea gets worse or does not get better.  You have a fever or persistent symptoms for more than 2 3 days.  You have a fever and your symptoms suddenly get worse. MAKE SURE YOU:   Understand these instructions.  Will watch your condition.  Will get help right  away if you are not doing well or get worse. Document Released: 02/14/2002 Document Revised: 02/11/2012 Document Reviewed: 11/02/2011 [facility] Patient Information 2014 [place], [place].   Emergency Department Resource Guide 1) Find a Doctor and Pay Out of Pocket Although you won't have to find out who is covered by your insurance plan, it is a good idea to ask around and get recommendations. You will then need to call the office and see if the doctor you have chosen will accept you as a new patient and what types of  options they offer for patients who are self-pay. Some doctors offer discounts or will set up payment plans for their patients who do not have insurance, but you will need to ask so you aren't surprised when you get to your appointment.  2) Contact Your Local Health Department Not all health departments have doctors that can see patients for sick visits, but many do, so it is worth a call to see if yours does. If you don't know where your local health department is, you can check in your phone book. The CDC also has a tool to help you locate your state's health department, and many state websites also have listings of all of their local health departments.  3) Find a [facility] If your illness is not likely to be very severe or complicated, you may want to try a walk in clinic. These are popping up all over the country in pharmacies, drugstores, and shopping centers. They're usually staffed by nurse practitioners or physician assistants that have been trained to treat common illnesses and complaints. They're usually fairly quick and inexpensive. However, if you have serious medical issues or chronic medical problems, these are probably not your best option.  No Primary Care Doctor: - Call Health Connect at  [phone] - they can help you locate a primary care doctor that  accepts your insurance, provides certain services, etc. - Physician Referral Service- [phone]  Chronic Pain Problems: Organization         Address  Phone   Notes  [facility]  [phone] Patients need to be referred by their primary care doctor.   Medication Assistance: Organization         Address  Phone   Notes  [facility] Medication [facility] [address] [place], [place] [address] [phone] --Must be a resident of [facility] -- Must have NO insurance coverage whatsoever (no Medicaid/ Medicare, etc.) -- The pt. MUST have a primary care doctor that directs  their care regularly and follows them in the community   MedAssist  [phone]   [employer]  [phone]    Agencies that provide inexpensive medical care: Organization         Address  Phone   Notes  [place]  [phone]   [facility]    [phone]   [facility] [place], [place] [address] [phone]   [place] [address] [place] [phone]   Planned Parenthood    [phone]   [facility]    [phone]   [place] and [address], [place] Phone:  [phone], Fax:  [phone] Hours of Operation:  9 am - 6 pm, M-F.  Also accepts Medicaid/Medicare and self-pay.  [facility] for [place] [address],  [place] Phone: [phone], Fax: [phone]. Hours of Operation:  8:30 am - 5:30 pm, M-F.  Also accepts Medicaid and self-pay.  [facility] [place] [address] [place] Phone: [phone]   [place], [place], [place] [phone], Ext. [phone] Mondays & Thursdays: 7-9 AM.  First 15 patients are seen on a first come, first serve basis.    [place] Providers:  Organization         Address  Phone   Notes  [facility] [address], [place] [phone] Also accepts self-pay patients.  [facility] [id number] [place], [place]  [phone]   [place], [address], [place] [phone]   [facility] [address], [place] [phone]   Lucianne Lei [address], [place]   [phone] Only accepts [place] Access [place] patients after they have their name applied to their card.   Self-Pay (no insurance) in [facility]:  Organization          Address  Phone   Notes  Sickle Cell Patients, [facility] [place] [phone]   [facility] [place] [phone]   [facility] [place]  [place], [place], [place] [phone]   [facility]/Dr. Osei-Bonsu  [address] [place] or [address], [place] [phone] Phone number for both [place] and [place] locations is the same.  [facility]gent [facility] and [facility] [address], [place] [phone]   [facility] [address], [place] or [address] [phone]   [facility] [address], [place] [phone], phone; [phone], fax Sees patients 1st and 3rd Saturday of every month.  Must not qualify for public or private insurance (i.e. Medicaid, Medicare, [place] Health Choice, Veterans' Benefits)  Household income should be no more than 200% of the poverty level The clinic cannot treat you if you are pregnant or think you are pregnant  Sexually transmitted diseases are not treated at the clinic.    Dental Care: Organization         Address  Phone  Notes  [facility] Department of [facility] [place] [phone] Accepts children up to age 27 who are enrolled in [place] or [place]; pregnant women with a Medicaid card; and children who have applied for Medicaid or [place] Health Choice, but were declined, whose parents can pay a reduced fee at time of service.  [facility] Department of [facility]  [address], [place] [phone] Accepts children up to age 76 who are enrolled in [place] or [place]; pregnant women with a Medicaid card; and children who have applied for Medicaid or [place] Health Choice, but were declined, whose parents can pay a reduced fee at time of  service.  [place] Adult Dental Access PROGRAM  [place] [phone] Patients are seen by appointment only. Walk-ins are not accepted. [place] will see patients 65 years of age and older. Monday - Tuesday (8am-5pm) Most Wednesdays (8:30-5pm) $30 per visit, cash only  [place]  [address], [facility] [phone] Patients are seen by appointment only. Walk-ins are not accepted. [place] will see patients 56 years of age and older. One Wednesday Evening (Monthly: Volunteer Based).  $30 per visit, cash only  [place]  [phone] for adults; Children under age 18, call Graduate Pediatric Dentistry at [phone]. Children aged 3-14, please call [phone] to request a pediatric application.  Dental services are provided in all areas of dental care including fillings, crowns and bridges, complete and partial dentures, implants, gum treatment, root canals, and extractions. Preventive care is also provided. Treatment is provided to both adults and children. Patients are selected via a lottery and there is often a waiting list.   [facility] [address] [place]  [phone] [email or web address]   [facility] [address] [place], [place] [phone], Ext. [phone] Second and Fourth Thursday of each month, opens at 6:30 AM; Clinic ends at 9 AM.  Patients are seen on a first-come first-served basis, and a limited number are seen during each clinic.   [facility]  [address] [place], [place] [phone]   Eligibility Requirements You must have lived in [place], [place], or [place] counties for at least the last three months.   You cannot be eligible for state or federal sponsored [employer], including [employer], [place], or [employer].   You generally cannot be eligible for healthcare insurance through your employer.     How to apply: Eligibility screenings are held every Tuesday and Wednesday afternoon from 1:00 pm until 4:00 pm. You do not need an appointment for the interview!  [facility] [address] [place], [place]   [place]  [place] Department  [place]  [phone]    Behavioral Health Resources in the Community: Intensive Outpatient Programs Organization         Address  Phone  Notes  [place] Braymer. [address], [place], [place] [phone]   [facility] [address] [place], [place]   ADS: Alcohol & Drug Svcs [address] [place], [place]   [place] [address],  [place], [place] or [phone]   Substance Abuse Resources Organization         Address  Phone  Notes  Alcohol and Drug Services  [phone]   [place]  [phone]   The [place]   Chinita Pester  [phone]   Residential & Outpatient Substance Abuse Program  [phone]   Psychological Services Organization         Address  Phone  Notes  [facility] [place]  [place]  [phone]   [place] [address], [place] or [phone]    Mobile Crisis Teams Organization         Address  Phone  Notes  [facility]rapeutic Alternatives, Mobile Crisis Care Unit  [phone]   Assertive Psychotherapeutic Services  [address] [place], [place]   [place] Levels [address], [place] [phone]    Self-Help/Support Groups Organization         Address  Phone             Notes  [place]. of [place] - variety of  support groups  [phone] Call for more information  Narcotics Anonymous (NA), [facility] [address]  Dr, [employer] [place]  2 meetings at this location   Residential Facilities manager         Address  Phone  Notes  [facility] Residential Treatment [place],    [place]  [phone]   [facility]  [address] [place] [phone], [place], [place]   Clarita [place], [place] [phone] Admissions: 8am-3pm M-F  Incentives Substance [place] [address]    [place], [place] [phone]   [facility] [address] [place], [place], [place]   [facility] [address]  [place], [place]   Insight Programs - Intensive Outpatient [address], [place], [place]   [facility] ([place] [address]  [place], [place] [phone] or [phone]   [facility] Treatment Services (RTS) [address], [place], [place] Accepts Medicaid  [facility] Aubrey [place] [address]  [place] [phone] Substance Abuse/Addiction Treatment   [facility] Organization         Address  Phone  Notes  [facility]  [phone]   Domenic Schwab, PhD [address] Arlis Porta [place], [place]   [phone] or [phone]   [place] [address] [place], [place] [phone]   [facility] [address], [place], [place] [phone] Insurance/Medicaid/sponsorship through [facility] and Families [address]                                    [place], [place] [phone] [place] [address], [place] [phone]    Dr. Adele Schilder  [phone]   [facility] Dept. 1) [address] [place] 2) [place] 3)  [address], [place] [phone]  [phone]   [place] [phone] or [phone] (After Hours)

## 2013-05-01 NOTE — ED Provider Notes (Signed)
CSN: [id number]     Arrival date & time 05/01/13  6283 History   First MD Initiated Contact with Patient 05/01/13 [phone]     Chief Complaint  Patient presents with  . Emesis  . Diarrhea     (Consider location/radiation/quality/duration/timing/severity/associated sxs/prior Treatment) HPI Patient reports N/V/D and abdominal pain x 2 days.  Pain is diffuse across her lower abdomen, R>L.  Emesis is yellow.  Having more vomiting than diarrhea.  She is also having dysuria, difficulty urinating.  Denies abnormal vaginal discharge or bleeding.  States this feels like previous urinary and kidney infection.      Past Medical History  Diagnosis Date  . DJD (degenerative joint disease)   . Scoliosis   . Kidney stones   . Anxiety   . Cancer     ovarian  . Ovarian cancer dx'd 2005  . Substance abuse     Vicodin  . Bipolar 1 disorder   . Depression   . Fibromyalgia    Past Surgical History  Procedure Laterality Date  . Abdominal hysterectomy    . Tubal ligation     Family History  Problem Relation Age of Onset  . Heart failure Mother   . Heart failure Father    History  Substance Use Topics  . Smoking status: Current Every Day Smoker -- 1.00 packs/day    Types: Cigarettes  . Smokeless tobacco: Never Used  . Alcohol Use: Yes     Comment: Rarely   OB History   Grav Para Term Preterm Abortions TAB SAB Ect Mult Living   5 5 5  0 0 0 0 0 0 5     Review of Systems  Constitutional: Negative for fever and chills.  Respiratory: Negative for cough and shortness of breath.   Cardiovascular: Negative for chest pain.  Gastrointestinal: Positive for nausea, vomiting, abdominal pain and diarrhea. Negative for constipation and blood in stool.  Genitourinary: Positive for dysuria and difficulty urinating. Negative for urgency, vaginal bleeding and vaginal discharge.  All other systems reviewed and are negative.      Allergies  Morphine and related; Penicillins; and Zofran  Home  Medications   Current Outpatient Rx  Name  Route  Sig  Dispense  Refill  . acetaminophen (TYLENOL) 325 MG tablet   Oral   Take 650 mg by mouth every 6 (six) hours as needed for mild pain or moderate pain.          Marland Kitchen estradiol (ESTRACE) 1 MG tablet   Oral   Take 1 tablet (1 mg total) by mouth daily.   30 tablet   3   . FLUoxetine (PROZAC) 40 MG capsule   Oral   Take 40 mg by mouth daily.         Marland Kitchen ibuprofen (ADVIL,MOTRIN) 200 MG tablet   Oral   Take 200 mg by mouth every 6 (six) hours as needed for mild pain or moderate pain.         . Multiple Vitamin (MULTIVITAMIN WITH MINERALS) TABS   Oral   Take 1 tablet by mouth daily.         . pregabalin (LYRICA) 75 MG capsule   Oral   Take 1 capsule (75 mg total) by mouth 2 (two) times daily.   30 capsule   3    BP 95/67  Pulse 78  Temp(Src) 99.2 F (37.3 C) (Oral)  Resp 18  SpO2 100%  LMP 03/10/2005 Physical Exam  Nursing note and vitals reviewed.  Constitutional: She appears well-developed and well-nourished. No distress.  HENT:  Head: Normocephalic and atraumatic.  Neck: Neck supple.  Cardiovascular: Normal rate and regular rhythm.   Pulmonary/Chest: Effort normal and breath sounds normal. No respiratory distress. She has no wheezes. She has no rales.  Abdominal: Soft. She exhibits no distension. There is tenderness. There is no rebound, no guarding and no CVA tenderness.  Diffuse lower abdominal tenderness.  Neurological: She is alert.  Skin: She is not diaphoretic.    ED Course  Procedures (including critical care time) Labs Review Labs Reviewed  CBC WITH DIFFERENTIAL - Abnormal; Notable for the following:    WBC 12.3 (*)    Neutrophils Relative % 79 (*)    Neutro Abs 9.8 (*)    All other components within normal limits  COMPREHENSIVE METABOLIC PANEL - Abnormal; Notable for the following:    Glucose, Bld 134 (*)    AST 64 (*)    ALT 92 (*)    All other components within normal limits  URINALYSIS,  ROUTINE W REFLEX MICROSCOPIC   Imaging Review Ct Abdomen Pelvis W Contrast  05/01/2013   CLINICAL DATA:  Diffuse abdominal pain. Vomiting. Diarrhea. Ovarian carcinoma.  EXAM: CT ABDOMEN AND PELVIS WITH CONTRAST  TECHNIQUE: Multidetector CT imaging of the abdomen and pelvis was performed using the standard protocol following bolus administration of intravenous contrast.  CONTRAST:  1108mL OMNIPAQUE IOHEXOL 300 MG/ML  SOLN  COMPARISON:  04/19/2012  FINDINGS: The liver, gallbladder, pancreas, spleen, adrenal glands, and kidneys are normal in appearance. No evidence of hydronephrosis.  No soft tissue masses or lymphadenopathy identified within the abdomen or pelvis. Prior hysterectomy noted. Adnexal regions are unremarkable.  No evidence of inflammatory process or abnormal fluid collections. No evidence of bowel wall thickening, dilatation, or hernia. No suspicious bone lesions identified.  IMPRESSION: Negative. No acute findings or other significant abnormality identified.   Electronically Signed   By: Earle Gell M.D.   On: 05/01/2013 12:13    EKG Interpretation   None       MDM   Final diagnoses:  Nausea vomiting and diarrhea    Pt with N/V/D and abdominal pain.  Pt reporting pain was worse on the right side.  Pt states she has been narcotic-free for 1 year but would like narcotics today.  WBC mildly elevated, has been the past few visits.  AST and ALT mildly elevated but this is unchanged.  UA negative.  CT abd/pelvis negative.  D/C home with phenergan.  Likely gastroenteritis.  Discussed result, findings, treatment, and follow up  with patient.  Pt given return precautions.  Pt verbalizes understanding and agrees with plan.        Clayton Bibles, PA-C 05/01/13 1504

## 2013-05-01 NOTE — ED Notes (Signed)
She tells me she has had lower abd. Pain R > L, plus frequent n/v and occasional diarrhea stool x 3 days.  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  She is in no distress.

## 2013-05-01 NOTE — ED Notes (Signed)
Pt states vomiting and diarrhea x 2 days.  Pt has emesis contents in bag with her.  Pt states no fever.

## 2013-05-03 ENCOUNTER — Ambulatory Visit: Payer: Medicaid Other | Attending: Internal Medicine

## 2013-05-03 NOTE — Progress Notes (Unsigned)
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 States she was taking medication for 10 mnths when med stopped. Informed pt she needs to f/u with [facility]. Pt became very upset and left.

## 2013-05-10 ENCOUNTER — Telehealth: Payer: Self-pay | Admitting: Internal Medicine

## 2013-05-10 NOTE — Telephone Encounter (Signed)
Returned pt's message during week of inclement weather.  Pt upset said she would be switching providers and hung up.

## 2013-05-23 ENCOUNTER — Encounter (HOSPITAL_COMMUNITY): Payer: Self-pay | Admitting: Emergency Medicine

## 2013-05-23 ENCOUNTER — Emergency Department (HOSPITAL_COMMUNITY)
Admission: EM | Admit: 2013-05-23 | Discharge: 2013-05-24 | Disposition: A | Discharge status: Home / Self Care | Payer: PRIVATE HEALTH INSURANCE | Attending: Emergency Medicine | Admitting: Emergency Medicine

## 2013-05-23 DIAGNOSIS — M797 Fibromyalgia: Secondary | ICD-10-CM

## 2013-05-23 DIAGNOSIS — F17200 Nicotine dependence, unspecified, uncomplicated: Secondary | ICD-10-CM | POA: Insufficient documentation

## 2013-05-23 DIAGNOSIS — F319 Bipolar disorder, unspecified: Secondary | ICD-10-CM | POA: Insufficient documentation

## 2013-05-23 DIAGNOSIS — M4120 Other idiopathic scoliosis, site unspecified: Secondary | ICD-10-CM | POA: Insufficient documentation

## 2013-05-23 DIAGNOSIS — Z8543 Personal history of malignant neoplasm of ovary: Secondary | ICD-10-CM | POA: Insufficient documentation

## 2013-05-23 DIAGNOSIS — R45851 Suicidal ideations: Secondary | ICD-10-CM | POA: Insufficient documentation

## 2013-05-23 DIAGNOSIS — F411 Generalized anxiety disorder: Secondary | ICD-10-CM | POA: Insufficient documentation

## 2013-05-23 DIAGNOSIS — Z880 Allergy status to penicillin: Secondary | ICD-10-CM | POA: Insufficient documentation

## 2013-05-23 DIAGNOSIS — IMO0001 Reserved for inherently not codable concepts without codable children: Secondary | ICD-10-CM | POA: Insufficient documentation

## 2013-05-23 DIAGNOSIS — N951 Menopausal and female climacteric states: Secondary | ICD-10-CM | POA: Insufficient documentation

## 2013-05-23 DIAGNOSIS — Z79899 Other long term (current) drug therapy: Secondary | ICD-10-CM | POA: Insufficient documentation

## 2013-05-23 DIAGNOSIS — M1990 Unspecified osteoarthritis, unspecified site: Secondary | ICD-10-CM | POA: Insufficient documentation

## 2013-05-23 DIAGNOSIS — Z87442 Personal history of urinary calculi: Secondary | ICD-10-CM | POA: Insufficient documentation

## 2013-05-23 LAB — CBC WITH DIFFERENTIAL/PLATELET
BASOS ABS: 0 10*3/uL (ref 0.0–0.1)
Basophils Relative: 0 % (ref 0–1)
EOS ABS: 0.3 10*3/uL (ref 0.0–0.7)
EOS PCT: 3 % (ref 0–5)
HCT: 41.8 % (ref 36.0–46.0)
Hemoglobin: 14.4 g/dL (ref 12.0–15.0)
Lymphocytes Relative: 40 % (ref 12–46)
Lymphs Abs: 3.7 10*3/uL (ref 0.7–4.0)
MCH: 31 pg (ref 26.0–34.0)
MCHC: 34.4 g/dL (ref 30.0–36.0)
MCV: 89.9 fL (ref 78.0–100.0)
Monocytes Absolute: 0.7 10*3/uL (ref 0.1–1.0)
Monocytes Relative: 8 % (ref 3–12)
Neutro Abs: 4.6 10*3/uL (ref 1.7–7.7)
Neutrophils Relative %: 49 % (ref 43–77)
PLATELETS: 274 10*3/uL (ref 150–400)
RBC: 4.65 MIL/uL (ref 3.87–5.11)
RDW: 11.8 % (ref 11.5–15.5)
WBC: 9.3 10*3/uL (ref 4.0–10.5)

## 2013-05-23 NOTE — ED Notes (Signed)
p there with [employer], IVC by her niece, she is abusing substances and threatening to kill herself. Pt has a flag on her chart for her care.

## 2013-05-24 ENCOUNTER — Encounter (HOSPITAL_COMMUNITY): Payer: Self-pay | Admitting: Registered Nurse

## 2013-05-24 DIAGNOSIS — F1994 Other psychoactive substance use, unspecified with psychoactive substance-induced mood disorder: Secondary | ICD-10-CM

## 2013-05-24 DIAGNOSIS — F1910 Other psychoactive substance abuse, uncomplicated: Secondary | ICD-10-CM

## 2013-05-24 DIAGNOSIS — R45851 Suicidal ideations: Secondary | ICD-10-CM

## 2013-05-24 DIAGNOSIS — F411 Generalized anxiety disorder: Secondary | ICD-10-CM

## 2013-05-24 LAB — COMPREHENSIVE METABOLIC PANEL
ALT: 37 U/L — ABNORMAL HIGH (ref 0–35)
AST: 37 U/L (ref 0–37)
Albumin: 4.5 g/dL (ref 3.5–5.2)
Alkaline Phosphatase: 61 U/L (ref 39–117)
BUN: 17 mg/dL (ref 6–23)
CALCIUM: 9.7 mg/dL (ref 8.4–10.5)
CO2: 27 mEq/L (ref 19–32)
CREATININE: 0.78 mg/dL (ref 0.50–1.10)
Chloride: 100 mEq/L (ref 96–112)
GFR calc non Af Amer: >90 mL/min (ref >90)
Glucose, Bld: 96 mg/dL (ref 70–99)
Potassium: 3.5 mEq/L — ABNORMAL LOW (ref 3.7–5.3)
Sodium: 141 mEq/L (ref 137–147)
TOTAL PROTEIN: 8.1 g/dL (ref 6.0–8.3)
Total Bilirubin: 0.4 mg/dL (ref 0.3–1.2)

## 2013-05-24 LAB — RAPID URINE DRUG SCREEN, HOSP PERFORMED
AMPHETAMINES: POSITIVE — AB
BENZODIAZEPINES: POSITIVE — AB
Barbiturates: NOT DETECTED
COCAINE: POSITIVE — AB
Opiates: POSITIVE — AB
Tetrahydrocannabinol: POSITIVE — AB

## 2013-05-24 LAB — ETHANOL

## 2013-05-24 MED ORDER — DIPHENHYDRAMINE HCL 25 MG PO CAPS: 50.0000 mg | ORAL_CAPSULE | Freq: Once | ORAL | Status: DC

## 2013-05-24 NOTE — ED Provider Notes (Signed)
CSN: [id number]     Arrival date & time 05/23/13  2220 History   First MD Initiated Contact with Patient 05/24/13 0004     No chief complaint on file.    (Consider location/radiation/quality/duration/timing/severity/associated sxs/prior Treatment) HPI IVC petition claims patient is suicidal abusing drugs and threatened use a knife to kill herself tonight; patient denies those claims and states her ex-boyfriend is making all those things up. Patient states she used to use methadone but no longer uses it, states she does not abuse drug, denies any threats to kill herself denies suicidal ideation denies homicidal ideation denies hallucinations denies current depression although has a history of depression in the past. Past Medical History  Diagnosis Date  . DJD (degenerative joint disease)   . Scoliosis   . Kidney stones   . Anxiety   . Cancer     ovarian  . Ovarian cancer dx'd 2005  . Substance abuse     Vicodin  . Bipolar 1 disorder   . Depression   . Fibromyalgia    Past Surgical History  Procedure Laterality Date  . Abdominal hysterectomy    . Tubal ligation     Family History  Problem Relation Age of Onset  . Heart failure Mother   . Heart failure Father    History  Substance Use Topics  . Smoking status: Current Every Day Smoker -- 1.00 packs/day    Types: Cigarettes  . Smokeless tobacco: Never Used  . Alcohol Use: Yes     Comment: Rarely   OB History   Grav Para Term Preterm Abortions TAB SAB Ect Mult Living   5 5 5  0 0 0 0 0 0 5     Review of Systems 10 Systems reviewed and are negative for acute change except as noted in the HPI.   Allergies  Morphine and related; Penicillins; and Zofran  Home Medications   Current Outpatient Rx  Name  Route  Sig  Dispense  Refill  . acetaminophen (TYLENOL) 325 MG tablet   Oral   Take 650 mg by mouth every 6 (six) hours as needed for mild pain or moderate pain.          Marland Kitchen estradiol (ESTRACE) 1 MG tablet   Oral   Take 1 tablet (1 mg total) by mouth daily.   30 tablet   3   . FLUoxetine (PROZAC) 40 MG capsule   Oral   Take 40 mg by mouth daily.         Marland Kitchen ibuprofen (ADVIL,MOTRIN) 800 MG tablet   Oral   Take 1 tablet (800 mg total) by mouth every 8 (eight) hours as needed for mild pain or moderate pain.   15 tablet   0   . Multiple Vitamin (MULTIVITAMIN WITH MINERALS) TABS   Oral   Take 1 tablet by mouth daily.         . pregabalin (LYRICA) 75 MG capsule   Oral   Take 1 capsule (75 mg total) by mouth 2 (two) times daily.   30 capsule   3   . promethazine (PHENERGAN) 25 MG tablet   Oral   Take 1 tablet (25 mg total) by mouth every 6 (six) hours as needed for nausea.   10 tablet   0    BP 106/70  Pulse 96  Temp(Src) 97.8 F (36.6 C) (Oral)  Resp 16  SpO2 100%  LMP 03/10/2005 Physical Exam  Nursing note and vitals reviewed. Constitutional:  Awake, alert,  nontoxic appearance.  HENT:  Head: Atraumatic.  Eyes: Right eye exhibits no discharge. Left eye exhibits no discharge.  Neck: Neck supple.  Cardiovascular: Normal rate and regular rhythm.   No murmur heard. Pulmonary/Chest: Effort normal and breath sounds normal. No respiratory distress. She has no wheezes. She has no rales. She exhibits no tenderness.  Abdominal: Soft. Bowel sounds are normal. She exhibits no distension. There is no tenderness. There is no rebound and no guarding.  Musculoskeletal: She exhibits no tenderness.  Baseline ROM, no obvious new focal weakness.  Neurological: She is alert.  Mental status and motor strength appears baseline for patient and situation.  Skin: No rash noted.  Psychiatric: She has a normal mood and affect.    ED Course  Procedures (including critical care time) Patient understand and agree with initial ED impression and plan with expectations set for ED visit. TTS ordered; Dispo pending. 0555 Labs Review Labs Reviewed  COMPREHENSIVE METABOLIC PANEL - Abnormal; Notable for  the following:    Potassium 3.5 (*)    ALT 37 (*)    All other components within normal limits  URINE RAPID DRUG SCREEN (HOSP PERFORMED) - Abnormal; Notable for the following:    Opiates POSITIVE (*)    Cocaine POSITIVE (*)    Benzodiazepines POSITIVE (*)    Amphetamines POSITIVE (*)    Tetrahydrocannabinol POSITIVE (*)    All other components within normal limits  CBC WITH DIFFERENTIAL  ETHANOL   Imaging Review No results found.   EKG Interpretation None      MDM   Final diagnoses:  Suicidal ideation  Anxiety state, unspecified  Hot flushes, perimenopausal  Fibromyalgia    Dispo pending.    Babette Relic, MD 05/27/13 1430

## 2013-05-24 NOTE — Consult Note (Signed)
[facility] Consult   Reason for Consult:  IVC related to suicidal ideation Referring Physician:  EDP  Courtney [name] is an 43 y.o. female. Total Time spent with patient: 45 minutes  Assessment: AXIS I:  Anxiety Disorder NOS, Substance Abuse and Substance Induced Mood Disorder AXIS II:  Deferred AXIS III:   Past Medical History  Diagnosis Date  . DJD (degenerative joint disease)   . Scoliosis   . Kidney stones   . Anxiety   . Cancer     ovarian  . Ovarian cancer dx'd 2005  . Substance abuse     Vicodin  . Bipolar 1 disorder   . Depression   . Fibromyalgia    AXIS IV:  other psychosocial or environmental problems and problems related to social environment AXIS V:  61-70 mild symptoms  Plan:  No evidence of imminent risk to self or others at present.   Patient does not meet criteria for psychiatric inpatient admission. Supportive therapy provided about ongoing stressors. Discussed crisis plan, support from social network, calling 911, coming to the Emergency Department, and calling Suicide Hotline.  Subjective:   Courtney [name] is a 43 y.o. female patient.  HPI:  Patient states "I caught my soon to be ex boyfriend with my niece.  My father told me to just leave before things excalated and I did what he told me.  The next thing I know when I got back home police were everywhere.  I guess he got mad cause I said my niece had to leave.  I never tried to kill myself or hurt myself.  I don't know why he done this just vindictive I guess.  I have to much to live for.  I have children; I'm going to [place]. College.  I am going to stay with my Dad for 30 days; the police told me I would have to go downtown and get the paper work and show them my lease that is in my name and he can leave in 30 days.    HPI Elements:   Location:  Suicidal ideation. Quality:  Patient denies. Severity:  Polysubstance abuse. Timing:  Yesterday.  Review of Systems  HENT: Negative.    Respiratory: Negative.   Musculoskeletal: Negative.   Neurological: Negative.   Psychiatric/Behavioral: Positive for substance abuse. Negative for depression, suicidal ideas and hallucinations. The patient is nervous/anxious. The patient does not have insomnia.     Family history of mental illness, substance abuse, and suicide. Past Psychiatric History: Past Medical History  Diagnosis Date  . DJD (degenerative joint disease)   . Scoliosis   . Kidney stones   . Anxiety   . Cancer     ovarian  . Ovarian cancer dx'd 2005  . Substance abuse     Vicodin  . Bipolar 1 disorder   . Depression   . Fibromyalgia     reports that she has been smoking Cigarettes.  She has been smoking about 1.00 pack per day. She has never used smokeless tobacco. She reports that she drinks alcohol. She reports that she does not use illicit drugs. Family History  Problem Relation Age of Onset  . Heart failure Mother   . Heart failure Father    Family History Substance Abuse: No Family Supports: Yes, List: (Father ) Living Arrangements: Other (Comment);Non-relatives/Friends;Children (Lives with [place] nd boyfriend ) Can pt return to current living arrangement?: Yes Abuse/Neglect [facility]) Physical Abuse: Denies Verbal Abuse: Denies Sexual Abuse: Denies Allergies:  Allergies  Allergen Reactions  . Morphine And Related Itching  . Penicillins Anaphylaxis, Hives and Swelling  . Zofran Itching and Other (See Comments)    "makes me feel funny"    ACT Assessment Complete:  Yes:    Educational Status    Risk to Self: Risk to self Suicidal Ideation: No Suicidal Intent: No Is patient at risk for suicide?: No Suicidal Plan?: No Access to Means: No What has been your use of drugs/alcohol within the last 12 months?: Pt hx of pain pill addiction and methadone  Previous Attempts/Gestures: No How many times?: 0 Other Self Harm Risks: None Triggers for Past Attempts: None known Intentional Self  Injurious Behavior: None Family Suicide History: No Recent stressful life event(s): Other (Comment);Conflict (Comment) (Relational issues with boyfriend and niece ) Persecutory voices/beliefs?: No Depression: Yes Depression Symptoms: Feeling angry/irritable Substance abuse history and/or treatment for substance abuse?: Yes Suicide prevention information given to non-admitted patients: Not applicable  Risk to Others: Risk to Others Homicidal Ideation: No Thoughts of Harm to Others: No Current Homicidal Intent: No Current Homicidal Plan: No Access to Homicidal Means: No Identified Victim: None  History of harm to others?: No Assessment of Violence: None Noted Violent Behavior Description: None  Does patient have access to weapons?: No Criminal Charges Pending?: No Does patient have a court date: No  Abuse: Abuse/Neglect Assessment (Assessment to be complete while patient is alone) Physical Abuse: Denies Verbal Abuse: Denies Sexual Abuse: Denies Exploitation of patient/patient's resources: Denies Self-Neglect: Denies  Prior Inpatient Therapy: Prior Inpatient Therapy Prior Inpatient Therapy: No Prior Therapy Dates: None  Prior Therapy Facilty/Provider(s): None  Reason for Treatment: None   Prior Outpatient Therapy: Prior Outpatient Therapy Prior Outpatient Therapy: Yes Prior Therapy Dates: Current  Prior Therapy Facilty/Provider(s): [facility]  Reason for Treatment: Med Mgt/Therapy   Additional Information: Additional Information 1:1 In Past 12 Months?: No CIRT Risk: No Elopement Risk: No Does patient have medical clearance?: Yes    Objective: Blood pressure 102/67, pulse 69, temperature 97.4 F (36.3 C), temperature source Oral, resp. rate 18, last menstrual period 03/10/2005, SpO2 96.00%.There is no weight on file to calculate BMI. Results for orders placed during the hospital encounter of 05/23/13 (from the past 72 hour(s))  CBC WITH DIFFERENTIAL     Status: None    Collection Time    05/23/13 11:12 PM      Result Value Ref Range   WBC 9.3  4.0 - 10.5 K/uL   RBC 4.65  3.87 - 5.11 MIL/uL   Hemoglobin 14.4  12.0 - 15.0 g/dL   HCT 41.8  36.0 - 46.0 %   MCV 89.9  78.0 - 100.0 fL   MCH 31.0  26.0 - 34.0 pg   MCHC 34.4  30.0 - 36.0 g/dL   RDW 11.8  11.5 - 15.5 %   Platelets 274  150 - 400 K/uL   Neutrophils Relative % 49  43 - 77 %   Neutro Abs 4.6  1.7 - 7.7 K/uL   Lymphocytes Relative 40  12 - 46 %   Lymphs Abs 3.7  0.7 - 4.0 K/uL   Monocytes Relative 8  3 - 12 %   Monocytes Absolute 0.7  0.1 - 1.0 K/uL   Eosinophils Relative 3  0 - 5 %   Eosinophils Absolute 0.3  0.0 - 0.7 K/uL   Basophils Relative 0  0 - 1 %   Basophils Absolute 0.0  0.0 - 0.1 K/uL  COMPREHENSIVE METABOLIC PANEL  Status: Abnormal   Collection Time    05/23/13 11:12 PM      Result Value Ref Range   Sodium 141  137 - 147 mEq/L   Potassium 3.5 (*) 3.7 - 5.3 mEq/L   Chloride 100  96 - 112 mEq/L   CO2 27  19 - 32 mEq/L   Glucose, Bld 96  70 - 99 mg/dL   BUN 17  6 - 23 mg/dL   Creatinine, Ser 0.78  0.50 - 1.10 mg/dL   Calcium 9.7  8.4 - 10.5 mg/dL   Total Protein 8.1  6.0 - 8.3 g/dL   Albumin 4.5  3.5 - 5.2 g/dL   AST 37  0 - 37 U/L   ALT 37 (*) 0 - 35 U/L   Alkaline Phosphatase 61  39 - 117 U/L   Total Bilirubin 0.4  0.3 - 1.2 mg/dL   GFR calc non Af Amer >90  >90 mL/min   GFR calc Af Amer >90  >90 mL/min   Comment: (NOTE)     The eGFR has been calculated using the CKD EPI equation.     This calculation has not been validated in all clinical situations.     eGFR's persistently <90 mL/min signify possible Chronic Kidney     Disease.  ETHANOL     Status: None   Collection Time    05/23/13 11:12 PM      Result Value Ref Range   Alcohol, Ethyl (B) <11  0 - 11 mg/dL   Comment:            LOWEST DETECTABLE LIMIT FOR     SERUM ALCOHOL IS 11 mg/dL     FOR MEDICAL PURPOSES ONLY  URINE RAPID DRUG SCREEN (HOSP PERFORMED)     Status: Abnormal   Collection Time     05/24/13  2:08 AM      Result Value Ref Range   Opiates POSITIVE (*) NONE DETECTED   Cocaine POSITIVE (*) NONE DETECTED   Benzodiazepines POSITIVE (*) NONE DETECTED   Amphetamines POSITIVE (*) NONE DETECTED   Tetrahydrocannabinol POSITIVE (*) NONE DETECTED   Barbiturates NONE DETECTED  NONE DETECTED   Comment:            DRUG SCREEN FOR MEDICAL PURPOSES     ONLY.  IF CONFIRMATION IS NEEDED     FOR ANY PURPOSE, NOTIFY LAB     WITHIN 5 DAYS.                LOWEST DETECTABLE LIMITS     FOR URINE DRUG SCREEN     Drug Class       Cutoff (ng/mL)     Amphetamine      1000     Barbiturate      200     Benzodiazepine   169     Tricyclics       678     Opiates          300     Cocaine          300     THC              50   Labs are reviewed and are pertinent for UDS positive for opiates, cocaine, Benzodiazepines, amphetamines, and THC.  Medication reviewed no changes made.  Current Facility-Administered Medications  Medication Dose Route Frequency Provider Last Rate Last Dose  . diphenhydrAMINE (BENADRYL) capsule 50 mg  50 mg Oral Once John M  Stevie Kern, MD       Current Outpatient Prescriptions  Medication Sig Dispense Refill  . acetaminophen (TYLENOL) 325 MG tablet Take 650 mg by mouth every 6 (six) hours as needed for mild pain or moderate pain.       Marland Kitchen estradiol (ESTRACE) 1 MG tablet Take 1 tablet (1 mg total) by mouth daily.  30 tablet  3  . FLUoxetine (PROZAC) 40 MG capsule Take 40 mg by mouth daily.      Marland Kitchen ibuprofen (ADVIL,MOTRIN) 800 MG tablet Take 1 tablet (800 mg total) by mouth every 8 (eight) hours as needed for mild pain or moderate pain.  15 tablet  0  . Multiple Vitamin (MULTIVITAMIN WITH MINERALS) TABS Take 1 tablet by mouth daily.      . pregabalin (LYRICA) 75 MG capsule Take 1 capsule (75 mg total) by mouth 2 (two) times daily.  30 capsule  3  . promethazine (PHENERGAN) 25 MG tablet Take 1 tablet (25 mg total) by mouth every 6 (six) hours as needed for nausea.  10 tablet  0     Psychiatric Specialty Exam:     Blood pressure 102/67, pulse 69, temperature 97.4 F (36.3 C), temperature source Oral, resp. rate 18, last menstrual period 03/10/2005, SpO2 96.00%.There is no weight on file to calculate BMI.  General Appearance: Disheveled  Eye Contact::  Good  Speech:  Clear and Coherent and Normal Rate  Volume:  Normal  Mood:  Anxious and "I'm fine; I just need to get out of here so I can get medicated"  Affect:  Congruent  Thought Process:  Circumstantial and Goal Directed  Orientation:  Full (Time, Place, and Person)  Thought Content:  Rumination  Suicidal Thoughts:  No  Homicidal Thoughts:  No  Memory:  Immediate;   Good Recent;   Good Remote;   Good  Judgement:  Poor  Insight:  Fair  Psychomotor Activity:  Normal  Concentration:  Fair  Recall:  Good  Fund of Knowledge:Good  Language: Good  Akathisia:  No  Handed:  Right  AIMS (if indicated):     Assets:  Communication Skills Housing  Sleep:      Musculoskeletal: Strength & Muscle Tone: within normal limits Gait & Station: normal Patient leans: N/A  Treatment Plan Summary: Outpatient provider  Disposition:  Discharge home.  Patient to follow up with [facility] Sessions (primary outpatient provider).  Discharge Assessment     Demographic Factors:  Caucasian  Total Time spent with patient: 15 minutes  Psychiatric Specialty Exam: Same as above  Musculoskeletal: Same as above  Mental Status Per Nursing Assessment::   On Admission:     Current Mental Status by Physician: Patient denies suicidal/homicidal ideation, psychosis, and paranoia  Loss Factors: NA  Historical Factors: Family history of suicide and Family history of mental illness or substance abuse  Risk Reduction Factors:   Responsible for children under 59 years of age, Sense of responsibility to family, Living with another person, especially a relative and Positive social support  Continued Clinical Symptoms:   Alcohol/Substance Abuse/Dependencies  Cognitive Features That Contribute To Risk:  Closed-mindedness    Suicide Risk:  Minimal: No identifiable suicidal ideation.  Patients presenting with no risk factors but with morbid ruminations; may be classified as minimal risk based on the severity of the depressive symptoms  Discharge Diagnoses:  Same as above  Plan Of Care/Follow-up recommendations:  Activity:  Resume usual activity Diet:  Resume usual diet  Is patient on multiple antipsychotic therapies  at discharge:  No   Has Patient had three or more failed trials of antipsychotic monotherapy by history:  No  Recommended Plan for Multiple Antipsychotic Therapies: NA  Tukker Byrns, FNP-BC 05/24/2013 9:41 AM
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
## 2013-05-24 NOTE — ED Notes (Signed)
Pt has a LG cell phone, flip flop, black stretch paints  Black printed t- shirt, black light weight jacket

## 2013-05-24 NOTE — Progress Notes (Signed)
[facility] provided pt with a [employer] of the [place]. Patient stated that she was in the process of getting [place] with [employer] [facility].

## 2013-05-24 NOTE — Discharge Instructions (Signed)
Borderline Personality Disorder Borderline personality disorder is a mental health disorder. People with borderline personality disorder have unhealthy patterns of perceiving, thinking about, and reacting to their environment and events in their life. These patterns are established by adolescence or early adulthood. People with borderline personality disorder also have difficulty coping with stress on their own and fear being abandoned by others. They have difficulty controlling their emotions. Their emotions change quickly, frequently, and intensely. They are easily upset and can become very angry, very suddenly. Their unpredictable behavior often leads to problems in their relationships. They often feel worthless, unloved, and emotionally empty. CAUSES No one knows the exact cause of borderline personality disorder. Most mental health experts think that there is more than one cause. Possible contributing factors include:  Genetic factors. These are traits that are passed down from one generation to the next. Many people with borderline personality disorder have a family history of the disorder.  Physical factors. The part of the brain that controls emotion may be different in people who have borderline personality disorder.  Social factors. Traumatic experiences involving other people may play a role in the development of borderline personality disorder. Examples include neglect, abandonment, and physical and sexual abuse. SYMPTOMS Signs and symptoms of borderline personality disorder include:  A series of unstable personal relationships.  A strong fear of being abandoned and frantic efforts to avoid abandonment.  Impulsive, self-destructive behavior, such as substance abuse, irrational spending of money, unprotected sex with multiple partners, reckless driving, and binge eating.  Poor self-image that also may change a lot, or a sense of identity that is inconsistent.  Recurring self-injury or  attempted suicide.  Severe mood swings, including depression, irritability, and anxiety.  Lasting feelings of emptiness.  Difficulty controlling anger.  Temporary feelings of paranoia or loss of touch with reality. DIAGNOSIS A diagnosis of borderline personality disorder requires the presence of at least 5 of the common signs and symptoms. This information is gathered from family and friends as well as medical professionals and legal professionals who have a close association with the patient. This information is also gathered during a psychiatric assessment. During the assessment, the patient is asked about early life experiences, level of education, employment status, physical health conditions, and current prescription and over-the-counter medicines used. TREATMENT Caregivers who usually treat borderline personality disorder are mental health professionals, such as psychologists, psychiatrists, and clinical social workers. More than one type of treatment may be needed. Types of treatment include:  Psychotherapy (also known as talk therapy or counseling).  Cognitive behavioral therapy. This helps the person to recognize and change unhealthy feelings, thoughts, and behaviors. They find new, more positive thoughts and actions to replace the old ones.  Dialectical behavioral therapy. Through this type of treatment, a person learns to understand his or her feelings and to regulate them. This may be one-on-one treatment or part of group therapy.  Family therapy. This treatment includes family members.  Medicine. Medicine may be used to help control emotions, reduce reckless and self-destructive behavior, treat anxiety, and treat depression. SEEK IMMEDIATE MEDICAL CARE IF:   You cannot control your behavior or emotions.  You think about hurting yourself.  You think about suicide. FOR MORE INFORMATION National Alliance on Mental Illness: www.nami.[place]: https://carter.com/ [place]: http://bpdresourcecenter.org Document Released: 06/11/2010 Document Revised: 05/19/2011 Document Reviewed: 06/11/2010 [facility] Patient Information 2014 [place], [place].  Depression, Adult Depression refers to feeling sad, low, down in the dumps, blue,  gloomy, or empty. In general, there are two kinds of depression: 1. Depression that we all experience from time to time because of upsetting life experiences, including the loss of a job or the ending of a relationship (normal sadness or normal grief). This kind of depression is considered normal, is short lived, and resolves within a few days to 2 weeks. (Depression experienced after the loss of a loved one is called bereavement. Bereavement often lasts longer than 2 weeks but normally gets better with time.) 2. Clinical depression, which lasts longer than normal sadness or normal grief or interferes with your ability to function at home, at work, and in school. It also interferes with your personal relationships. It affects almost every aspect of your life. Clinical depression is an illness. Symptoms of depression also can be caused by conditions other than normal sadness and grief or clinical depression. Examples of these conditions are listed as follows:  Physical illness Some physical illnesses, including underactive thyroid gland (hypothyroidism), severe anemia, specific types of cancer, diabetes, uncontrolled seizures, heart and lung problems, strokes, and chronic pain are commonly associated with symptoms of depression.  Side effects of some prescription medicine In some people, certain types of prescription medicine can cause symptoms of depression.  Substance abuse Abuse of alcohol and illicit drugs can cause symptoms of depression. SYMPTOMS Symptoms of normal sadness and normal grief include the following:  Feeling sad or crying for short periods of time.  Not caring about  anything (apathy).  Difficulty sleeping or sleeping too much.  No longer able to enjoy the things you used to enjoy.  Desire to be by oneself all the time (social isolation).  Lack of energy or motivation.  Difficulty concentrating or remembering.  Change in appetite or weight.  Restlessness or agitation. Symptoms of clinical depression include the same symptoms of normal sadness or normal grief and also the following symptoms:  Feeling sad or crying all the time.  Feelings of guilt or worthlessness.  Feelings of hopelessness or helplessness.  Thoughts of suicide or the desire to harm yourself (suicidal ideation).  Loss of touch with reality (psychotic symptoms). Seeing or hearing things that are not real (hallucinations) or having false beliefs about your life or the people around you (delusions and paranoia). DIAGNOSIS  The diagnosis of clinical depression usually is based on the severity and duration of the symptoms. Your caregiver also will ask you questions about your medical history and substance use to find out if physical illness, use of prescription medicine, or substance abuse is causing your depression. Your caregiver also may order blood tests. TREATMENT  Typically, normal sadness and normal grief do not require treatment. However, sometimes antidepressant medicine is prescribed for bereavement to ease the depressive symptoms until they resolve. The treatment for clinical depression depends on the severity of your symptoms but typically includes antidepressant medicine, counseling with a mental health professional, or a combination of both. Your caregiver will help to determine what treatment is best for you. Depression caused by physical illness usually goes away with appropriate medical treatment of the illness. If prescription medicine is causing depression, talk with your caregiver about stopping the medicine, decreasing the dose, or substituting another  medicine. Depression caused by abuse of alcohol or illicit drugs abuse goes away with abstinence from these substances. Some adults need professional help in order to stop drinking or using drugs. SEEK IMMEDIATE CARE IF:  You have thoughts about hurting yourself or others.  You lose touch with reality (  have psychotic symptoms).  You are taking medicine for depression and have a serious side effect. FOR MORE INFORMATION National Alliance on Mental Illness: www.nami.[employer] of Mental Health: https://carter.com/ Document Released: 02/22/2000 Document Revised: 08/26/2011 Document Reviewed: 05/26/2011 [facility] Patient Information 2014 [place].

## 2013-05-24 NOTE — Consult Note (Signed)
Face to face evaluation and I agree with this note 

## 2013-06-02 ENCOUNTER — Emergency Department (HOSPITAL_COMMUNITY)
Admission: EM | Admit: 2013-06-02 | Discharge: 2013-06-02 | Disposition: A | Discharge status: Home / Self Care | Payer: PRIVATE HEALTH INSURANCE | Attending: Emergency Medicine | Admitting: Emergency Medicine

## 2013-06-02 ENCOUNTER — Encounter (HOSPITAL_COMMUNITY): Payer: Self-pay | Admitting: Emergency Medicine

## 2013-06-02 DIAGNOSIS — M4120 Other idiopathic scoliosis, site unspecified: Secondary | ICD-10-CM | POA: Insufficient documentation

## 2013-06-02 DIAGNOSIS — Z90710 Acquired absence of both cervix and uterus: Secondary | ICD-10-CM | POA: Insufficient documentation

## 2013-06-02 DIAGNOSIS — Z880 Allergy status to penicillin: Secondary | ICD-10-CM | POA: Insufficient documentation

## 2013-06-02 DIAGNOSIS — R142 Eructation: Secondary | ICD-10-CM | POA: Insufficient documentation

## 2013-06-02 DIAGNOSIS — Z79899 Other long term (current) drug therapy: Secondary | ICD-10-CM | POA: Insufficient documentation

## 2013-06-02 DIAGNOSIS — Z8543 Personal history of malignant neoplasm of ovary: Secondary | ICD-10-CM | POA: Insufficient documentation

## 2013-06-02 DIAGNOSIS — Z9851 Tubal ligation status: Secondary | ICD-10-CM | POA: Insufficient documentation

## 2013-06-02 DIAGNOSIS — M549 Dorsalgia, unspecified: Secondary | ICD-10-CM | POA: Insufficient documentation

## 2013-06-02 DIAGNOSIS — IMO0001 Reserved for inherently not codable concepts without codable children: Secondary | ICD-10-CM | POA: Insufficient documentation

## 2013-06-02 DIAGNOSIS — F411 Generalized anxiety disorder: Secondary | ICD-10-CM | POA: Insufficient documentation

## 2013-06-02 DIAGNOSIS — G8929 Other chronic pain: Secondary | ICD-10-CM | POA: Insufficient documentation

## 2013-06-02 DIAGNOSIS — R110 Nausea: Secondary | ICD-10-CM | POA: Insufficient documentation

## 2013-06-02 DIAGNOSIS — M1990 Unspecified osteoarthritis, unspecified site: Secondary | ICD-10-CM | POA: Insufficient documentation

## 2013-06-02 DIAGNOSIS — F17200 Nicotine dependence, unspecified, uncomplicated: Secondary | ICD-10-CM | POA: Insufficient documentation

## 2013-06-02 DIAGNOSIS — F319 Bipolar disorder, unspecified: Secondary | ICD-10-CM | POA: Insufficient documentation

## 2013-06-02 DIAGNOSIS — R143 Flatulence: Secondary | ICD-10-CM

## 2013-06-02 DIAGNOSIS — R141 Gas pain: Secondary | ICD-10-CM | POA: Insufficient documentation

## 2013-06-02 DIAGNOSIS — Z87442 Personal history of urinary calculi: Secondary | ICD-10-CM | POA: Insufficient documentation

## 2013-06-02 NOTE — ED Provider Notes (Signed)
CSN: [id number]     Arrival date & time 06/02/13  1505 History   First MD Initiated Contact with Patient 06/02/13 1522     Chief Complaint  Patient presents with  . Tailbone Pain    pain in back x24 hrs  . Bloated    abdominal swelling x 3 hrs     (Consider location/radiation/quality/duration/timing/severity/associated sxs/prior Treatment) HPI Comments: Patient is a 43 year old female with a past medical history of substance abuse, chronic back pain and frequent visits to the ED who presents with chronic back pain. Patient also complains of abdominal distension that started 3 hours ago. Patient reports feeling like she has a UTI. Patient does to the methadone clinic and was told to come to the ED if the pain is unbearable. No aggravating/alleviating factors. Patient reports associated nausea.    Past Medical History  Diagnosis Date  . DJD (degenerative joint disease)   . Scoliosis   . Kidney stones   . Anxiety   . Cancer     ovarian  . Ovarian cancer dx'd 2005  . Substance abuse     Vicodin  . Bipolar 1 disorder   . Depression   . Fibromyalgia    Past Surgical History  Procedure Laterality Date  . Abdominal hysterectomy    . Tubal ligation     Family History  Problem Relation Age of Onset  . Heart failure Mother   . Heart failure Father    History  Substance Use Topics  . Smoking status: Current Every Day Smoker -- 1.00 packs/day    Types: Cigarettes  . Smokeless tobacco: Never Used  . Alcohol Use: Yes     Comment: Rarely   OB History   Grav Para Term Preterm Abortions TAB SAB Ect Mult Living   5 5 5  0 0 0 0 0 0 5     Review of Systems  Constitutional: Negative for fever, chills and fatigue.  HENT: Negative for trouble swallowing.   Eyes: Negative for visual disturbance.  Respiratory: Negative for shortness of breath.   Cardiovascular: Negative for chest pain and palpitations.  Gastrointestinal: Positive for abdominal distention. Negative for nausea,  vomiting, abdominal pain and diarrhea.  Genitourinary: Negative for dysuria and difficulty urinating.  Musculoskeletal: Positive for back pain. Negative for arthralgias and neck pain.  Skin: Negative for color change.  Neurological: Negative for dizziness and weakness.  Psychiatric/Behavioral: Negative for dysphoric mood.      Allergies  Morphine and related; Penicillins; and Zofran  Home Medications   Current Outpatient Rx  Name  Route  Sig  Dispense  Refill  . acetaminophen (TYLENOL) 325 MG tablet   Oral   Take 650 mg by mouth every 6 (six) hours as needed for mild pain or moderate pain.          Marland Kitchen estradiol (ESTRACE) 1 MG tablet   Oral   Take 1 tablet (1 mg total) by mouth daily.   30 tablet   3   . FLUoxetine (PROZAC) 40 MG capsule   Oral   Take 40 mg by mouth daily.         Marland Kitchen ibuprofen (ADVIL,MOTRIN) 800 MG tablet   Oral   Take 1 tablet (800 mg total) by mouth every 8 (eight) hours as needed for mild pain or moderate pain.   15 tablet   0   . Multiple Vitamin (MULTIVITAMIN WITH MINERALS) TABS   Oral   Take 1 tablet by mouth daily.         Marland Kitchen  pregabalin (LYRICA) 75 MG capsule   Oral   Take 1 capsule (75 mg total) by mouth 2 (two) times daily.   30 capsule   3   . promethazine (PHENERGAN) 25 MG tablet   Oral   Take 1 tablet (25 mg total) by mouth every 6 (six) hours as needed for nausea.   10 tablet   0    BP 95/43  Pulse 76  Temp(Src) 98.6 F (37 C) (Oral)  SpO2 100%  LMP 03/10/2005 Physical Exam  Nursing note and vitals reviewed. Constitutional: She is oriented to person, place, and time. She appears well-developed and well-nourished. No distress.  HENT:  Head: Normocephalic and atraumatic.  Eyes: Conjunctivae and EOM are normal.  Neck: Normal range of motion.  Cardiovascular: Normal rate and regular rhythm.  Exam reveals no gallop and no friction rub.   No murmur heard. Pulmonary/Chest: Effort normal and breath sounds normal. She has no  wheezes. She has no rales. She exhibits no tenderness.  Abdominal: Soft. There is no tenderness.  Musculoskeletal: Normal range of motion.  No midline spine tenderness to palpation.   Neurological: She is alert and oriented to person, place, and time. Coordination normal.  Speech is goal-oriented. Moves limbs without ataxia.   Skin: Skin is warm and dry.  Psychiatric: She has a normal mood and affect. Her behavior is normal.    ED Course  Procedures (including critical care time) Labs Review Labs Reviewed - No data to display Imaging Review No results found.   EKG Interpretation None      MDM   Final diagnoses:  Chronic back pain    3:46 PM I told the patient I would not give her narcotic pain medication for her chronic back pain. Patient thinks she has a UTI and I offered to order a urinalysis. Patient refuses and "just wants to go home." Vitals are stable and patient is afebrile. Patient does not appear to have an emergent or life-threatening illness at this time.    Alvina Chou, PA-C 06/02/13 1551

## 2013-06-02 NOTE — ED Notes (Signed)
Bed: WTR9 Expected date:  Expected time:  Means of arrival:  Comments: Osie Bond

## 2013-06-02 NOTE — Progress Notes (Signed)
[facility] did not get to see patient but will be sending information about the [facility] Orange Card program, using the address provided.  °

## 2013-06-02 NOTE — ED Notes (Signed)
Pt reports swelling in stomach and nausea x 3 hrs. C/o recurrent low back pain

## 2013-06-03 NOTE — ED Provider Notes (Signed)
Medical screening examination/treatment/procedure(s) were performed by non-physician practitioner and as supervising physician I was immediately available for consultation/collaboration.   EKG Interpretation None          Courtney F Horton, MD 06/03/13 0333 

## 2013-06-13 ENCOUNTER — Encounter (HOSPITAL_COMMUNITY): Payer: Self-pay | Admitting: Emergency Medicine

## 2013-06-13 ENCOUNTER — Emergency Department (HOSPITAL_COMMUNITY)
Admission: EM | Admit: 2013-06-13 | Discharge: 2013-06-13 | Discharge status: Against Medical Advice | Payer: PRIVATE HEALTH INSURANCE | Attending: Emergency Medicine | Admitting: Emergency Medicine

## 2013-06-13 ENCOUNTER — Emergency Department (INDEPENDENT_AMBULATORY_CARE_PROVIDER_SITE_OTHER)
Admission: EM | Admit: 2013-06-13 | Discharge: 2013-06-13 | Disposition: A | Discharge status: Other Acute Inpatient Hospital | Payer: Self-pay | Source: Home / Self Care

## 2013-06-13 ENCOUNTER — Emergency Department (HOSPITAL_COMMUNITY)
Admission: EM | Admit: 2013-06-13 | Discharge: 2013-06-14 | Disposition: A | Discharge status: Home / Self Care | Payer: PRIVATE HEALTH INSURANCE | Attending: Emergency Medicine | Admitting: Emergency Medicine

## 2013-06-13 DIAGNOSIS — R109 Unspecified abdominal pain: Secondary | ICD-10-CM

## 2013-06-13 DIAGNOSIS — M1990 Unspecified osteoarthritis, unspecified site: Secondary | ICD-10-CM | POA: Insufficient documentation

## 2013-06-13 DIAGNOSIS — K921 Melena: Secondary | ICD-10-CM | POA: Insufficient documentation

## 2013-06-13 DIAGNOSIS — K5900 Constipation, unspecified: Secondary | ICD-10-CM | POA: Insufficient documentation

## 2013-06-13 DIAGNOSIS — K922 Gastrointestinal hemorrhage, unspecified: Secondary | ICD-10-CM

## 2013-06-13 DIAGNOSIS — Z880 Allergy status to penicillin: Secondary | ICD-10-CM | POA: Insufficient documentation

## 2013-06-13 DIAGNOSIS — R1084 Generalized abdominal pain: Secondary | ICD-10-CM | POA: Insufficient documentation

## 2013-06-13 DIAGNOSIS — R1907 Generalized intra-abdominal and pelvic swelling, mass and lump: Secondary | ICD-10-CM | POA: Insufficient documentation

## 2013-06-13 DIAGNOSIS — F411 Generalized anxiety disorder: Secondary | ICD-10-CM | POA: Insufficient documentation

## 2013-06-13 DIAGNOSIS — IMO0001 Reserved for inherently not codable concepts without codable children: Secondary | ICD-10-CM | POA: Insufficient documentation

## 2013-06-13 DIAGNOSIS — F17200 Nicotine dependence, unspecified, uncomplicated: Secondary | ICD-10-CM | POA: Insufficient documentation

## 2013-06-13 DIAGNOSIS — Z8543 Personal history of malignant neoplasm of ovary: Secondary | ICD-10-CM | POA: Insufficient documentation

## 2013-06-13 DIAGNOSIS — Z79899 Other long term (current) drug therapy: Secondary | ICD-10-CM | POA: Insufficient documentation

## 2013-06-13 DIAGNOSIS — Z87442 Personal history of urinary calculi: Secondary | ICD-10-CM | POA: Insufficient documentation

## 2013-06-13 DIAGNOSIS — F3289 Other specified depressive episodes: Secondary | ICD-10-CM | POA: Insufficient documentation

## 2013-06-13 DIAGNOSIS — F329 Major depressive disorder, single episode, unspecified: Secondary | ICD-10-CM | POA: Insufficient documentation

## 2013-06-13 LAB — CBC WITH DIFFERENTIAL/PLATELET
BASOS ABS: 0 10*3/uL (ref 0.0–0.1)
Basophils Relative: 0 % (ref 0–1)
Eosinophils Absolute: 0.4 10*3/uL (ref 0.0–0.7)
Eosinophils Relative: 4 % (ref 0–5)
HCT: 41.6 % (ref 36.0–46.0)
Hemoglobin: 14.3 g/dL (ref 12.0–15.0)
Lymphocytes Relative: 33 % (ref 12–46)
Lymphs Abs: 3.4 10*3/uL (ref 0.7–4.0)
MCH: 31.6 pg (ref 26.0–34.0)
MCHC: 34.4 g/dL (ref 30.0–36.0)
MCV: 92 fL (ref 78.0–100.0)
Monocytes Absolute: 0.7 10*3/uL (ref 0.1–1.0)
Monocytes Relative: 7 % (ref 3–12)
NEUTROS ABS: 5.6 10*3/uL (ref 1.7–7.7)
Neutrophils Relative %: 56 % (ref 43–77)
PLATELETS: 276 10*3/uL (ref 150–400)
RBC: 4.52 MIL/uL (ref 3.87–5.11)
RDW: 12.3 % (ref 11.5–15.5)
WBC: 10.1 10*3/uL (ref 4.0–10.5)

## 2013-06-13 LAB — COMPREHENSIVE METABOLIC PANEL
ALBUMIN: 3.8 g/dL (ref 3.5–5.2)
ALT: 59 U/L — ABNORMAL HIGH (ref 0–35)
AST: 50 U/L — AB (ref 0–37)
Alkaline Phosphatase: 82 U/L (ref 39–117)
BILIRUBIN TOTAL: 0.2 mg/dL — AB (ref 0.3–1.2)
BUN: 16 mg/dL (ref 6–23)
CHLORIDE: 105 meq/L (ref 96–112)
CO2: 26 mEq/L (ref 19–32)
Calcium: 9 mg/dL (ref 8.4–10.5)
Creatinine, Ser: 0.65 mg/dL (ref 0.50–1.10)
GFR calc Af Amer: >90 mL/min (ref >90)
GFR calc non Af Amer: >90 mL/min (ref >90)
Glucose, Bld: 94 mg/dL (ref 70–99)
POTASSIUM: 4.1 meq/L (ref 3.7–5.3)
SODIUM: 144 meq/L (ref 137–147)
Total Protein: 7.4 g/dL (ref 6.0–8.3)

## 2013-06-13 LAB — LIPASE, BLOOD: Lipase: 66 U/L — ABNORMAL HIGH (ref 11–59)

## 2013-06-13 NOTE — ED Notes (Signed)
C/o generalized abdominal distention, pain, bloody stools x 1 week

## 2013-06-13 NOTE — ED Provider Notes (Signed)
CSN: [id number]     Arrival date & time 06/13/13  1714 History   None    Chief Complaint  Patient presents with  . Abdominal Pain   (Consider location/radiation/quality/duration/timing/severity/associated sxs/prior Treatment) Patient is a 43 y.o. female presenting with abdominal pain. The history is provided by the patient.  Abdominal Pain Pain location:  Generalized Pain quality: bloating and sharp   Pain radiates to:  Does not radiate Pain severity:  Moderate Onset quality:  Gradual Duration:  1 week Progression:  Worsening Context comment:  Similar hx of same without dx.  h/o methadone  use. vomited x1 on sat. Associated symptoms: diarrhea and hematochezia   Associated symptoms: no anorexia     Past Medical History  Diagnosis Date  . DJD (degenerative joint disease)   . Scoliosis   . Kidney stones   . Anxiety   . Cancer     ovarian  . Ovarian cancer dx'd 2005  . Substance abuse     Vicodin  . Bipolar 1 disorder   . Depression   . Fibromyalgia    Past Surgical History  Procedure Laterality Date  . Abdominal hysterectomy    . Tubal ligation     Family History  Problem Relation Age of Onset  . Heart failure Mother   . Heart failure Father    History  Substance Use Topics  . Smoking status: Current Every Day Smoker -- 1.00 packs/day    Types: Cigarettes  . Smokeless tobacco: Never Used  . Alcohol Use: Yes     Comment: Rarely   OB History   Grav Para Term Preterm Abortions TAB SAB Ect Mult Living   5 5 5  0 0 0 0 0 0 5     Review of Systems  Gastrointestinal: Positive for abdominal pain, diarrhea and hematochezia. Negative for anorexia.    Allergies  Morphine and related; Penicillins; and Zofran  Home Medications   Current Outpatient Rx  Name  Route  Sig  Dispense  Refill  . acetaminophen (TYLENOL) 325 MG tablet   Oral   Take 650 mg by mouth every 6 (six) hours as needed for mild pain or moderate pain.          Marland Kitchen estradiol (ESTRACE) 1 MG  tablet   Oral   Take 1 tablet (1 mg total) by mouth daily.   30 tablet   3   . FLUoxetine (PROZAC) 40 MG capsule   Oral   Take 40 mg by mouth daily.         Marland Kitchen ibuprofen (ADVIL,MOTRIN) 800 MG tablet   Oral   Take 1 tablet (800 mg total) by mouth every 8 (eight) hours as needed for mild pain or moderate pain.   15 tablet   0   . Multiple Vitamin (MULTIVITAMIN WITH MINERALS) TABS   Oral   Take 1 tablet by mouth daily.         . pregabalin (LYRICA) 75 MG capsule   Oral   Take 1 capsule (75 mg total) by mouth 2 (two) times daily.   30 capsule   3   . promethazine (PHENERGAN) 25 MG tablet   Oral   Take 1 tablet (25 mg total) by mouth every 6 (six) hours as needed for nausea.   10 tablet   0    BP 121/75  Pulse 80  Temp(Src) 97.7 F (36.5 C) (Oral)  Resp 16  SpO2 100%  LMP 03/10/2005 Physical Exam  Nursing note and vitals  reviewed. Constitutional: She is oriented to person, place, and time. She appears well-developed and well-nourished. No distress.  Abdominal: She exhibits distension. She exhibits no mass. Bowel sounds are decreased. There is no hepatosplenomegaly. There is generalized tenderness. There is guarding. There is no rigidity and no rebound.  Neurological: She is alert and oriented to person, place, and time.  Skin: Skin is warm and dry.    ED Course  Procedures (including critical care time) Labs Review Labs Reviewed - No data to display Imaging Review No results found.   MDM   1. Acute GI bleeding    Sent for worsening abd pain , distention and gi bleed. Sx for 1 wk.    Billy Fischer, MD 06/13/13 [phone]

## 2013-06-13 NOTE — ED Notes (Signed)
Pt. reports pain across abdomen with swelling and bloody stools onset 1 week ago . Denies nausea /vomitting or diarrhea.

## 2013-06-13 NOTE — ED Notes (Signed)
Pt reports swelling to stomach with bright red blood to bowel and grossly smelly burps. Pain is more to R side lower ab. Last BM was Saturday. Pt reports one episode of vomiting Saturday and a burning sensation to epigastric area that goes up toward throat. Pt alert in triage room with family in room.

## 2013-06-14 ENCOUNTER — Ambulatory Visit: Payer: Self-pay | Admitting: Internal Medicine

## 2013-06-14 MED ORDER — FAMOTIDINE 20 MG PO TABS: 20.0000 mg | ORAL_TABLET | Freq: Two times a day (BID) | ORAL | Status: DC

## 2013-06-14 MED ORDER — POLYETHYLENE GLYCOL 3350 17 G PO PACK: 17.0000 g | PACK | Freq: Every day | ORAL | Status: DC

## 2013-06-14 NOTE — ED Notes (Signed)
PA at bedside.

## 2013-06-14 NOTE — ED Provider Notes (Signed)
CSN: [id number]     Arrival date & time 06/13/13  2023 History   First MD Initiated Contact with Patient 06/13/13 2340     Chief Complaint  Patient presents with  . Abdominal Pain  . Bloated     (Consider location/radiation/quality/duration/timing/severity/associated sxs/prior Treatment) Patient is a 43 y.o. female presenting with abdominal pain. The history is provided by the patient. No language interpreter was used.  Abdominal Pain Pain location:  Generalized Associated symptoms: constipation   Associated symptoms: no chest pain, no chills, no dysuria, no fever, no shortness of breath and no vomiting   Associated symptoms comment:  She complains of abdominal pain and swelling, increased malodorous belching and BRB blood with normal appearing stool. Symptoms have been present for one week. She denies bowel movement in the last 3 days. No fever, vomiting. She reports only previous abdominal surgery was transvaginal hysterectomy.    Past Medical History  Diagnosis Date  . DJD (degenerative joint disease)   . Scoliosis   . Kidney stones   . Anxiety   . Cancer     ovarian  . Ovarian cancer dx'd 2005  . Substance abuse     Vicodin  . Bipolar 1 disorder   . Depression   . Fibromyalgia    Past Surgical History  Procedure Laterality Date  . Abdominal hysterectomy    . Tubal ligation     Family History  Problem Relation Age of Onset  . Heart failure Mother   . Heart failure Father    History  Substance Use Topics  . Smoking status: Current Every Day Smoker -- 1.00 packs/day    Types: Cigarettes  . Smokeless tobacco: Never Used  . Alcohol Use: Yes     Comment: Rarely   OB History   Grav Para Term Preterm Abortions TAB SAB Ect Mult Living   5 5 5  0 0 0 0 0 0 5     Review of Systems  Constitutional: Negative for fever and chills.  Respiratory: Negative.  Negative for shortness of breath.   Cardiovascular: Negative.  Negative for chest pain.  Gastrointestinal:  Positive for abdominal pain, constipation and blood in stool. Negative for vomiting and rectal pain.  Genitourinary: Negative.  Negative for dysuria.  Musculoskeletal: Negative.  Negative for back pain.      Allergies  Morphine and related; Penicillins; and Zofran  Home Medications   Current Outpatient Rx  Name  Route  Sig  Dispense  Refill  . estradiol (ESTRACE) 1 MG tablet   Oral   Take 1 tablet (1 mg total) by mouth daily.   30 tablet   3   . FLUoxetine (PROZAC) 40 MG capsule   Oral   Take 40 mg by mouth daily.         Marland Kitchen ibuprofen (ADVIL,MOTRIN) 800 MG tablet   Oral   Take 1 tablet (800 mg total) by mouth every 8 (eight) hours as needed for mild pain or moderate pain.   15 tablet   0   . pregabalin (LYRICA) 75 MG capsule   Oral   Take 1 capsule (75 mg total) by mouth 2 (two) times daily.   30 capsule   3    BP 119/83  Pulse 70  Temp(Src) 98.6 F (37 C) (Oral)  Resp 18  SpO2 100%  LMP 03/10/2005 Physical Exam  Constitutional: She is oriented to person, place, and time. She appears well-developed and well-nourished.  HENT:  Head: Normocephalic.  Neck: Normal range  of motion. Neck supple.  Cardiovascular: Normal rate and regular rhythm.   Pulmonary/Chest: Effort normal and breath sounds normal.  Abdominal: Soft. Bowel sounds are normal. She exhibits no mass. There is tenderness. There is no rebound and no guarding.  Diffuse tenderness to palpation.   Musculoskeletal: Normal range of motion.  Neurological: She is alert and oriented to person, place, and time.  Skin: Skin is warm and dry. No rash noted.  Psychiatric: She has a normal mood and affect.    ED Course  Procedures (including critical care time) Labs Review Labs Reviewed  CBC WITH DIFFERENTIAL  COMPREHENSIVE METABOLIC PANEL  URINALYSIS, ROUTINE W REFLEX MICROSCOPIC  PREGNANCY, URINE   Imaging Review No results found.   EKG Interpretation None      MDM   Final diagnoses:  None     1. Abdominal pain  Patient's chart reviewed. She has normal vital signs on multiple rechecks, no fever, tachycardia or hypertension. Abdomen is soft, without mass with normoactive bowel sounds. Doubt obstruction, infection or inflammatory process without change in blood studies or vital signs with one week of symptoms. Feel patient is stable for discharge. Refer to GI. REcommend laxative, antacid use.    Dewaine Oats, PA-C 06/14/13 0021

## 2013-06-14 NOTE — Discharge Instructions (Signed)
Abdominal Pain, Women °Abdominal (stomach, pelvic, or belly) pain can be caused by many things. It is important to tell your doctor: °· The location of the pain. °· Does it come and go or is it present all the time? °· Are there things that start the pain (eating certain foods, exercise)? °· Are there other symptoms associated with the pain (fever, nausea, vomiting, diarrhea)? °All of this is helpful to know when trying to find the cause of the pain. °CAUSES  °· Stomach: virus or bacteria infection, or ulcer. °· Intestine: appendicitis (inflamed appendix), regional ileitis (Crohn's disease), ulcerative colitis (inflamed colon), irritable bowel syndrome, diverticulitis (inflamed diverticulum of the colon), or cancer of the stomach or intestine. °· Gallbladder disease or stones in the gallbladder. °· Kidney disease, kidney stones, or infection. °· Pancreas infection or cancer. °· Fibromyalgia (pain disorder). °· Diseases of the female organs: °· Uterus: fibroid (non-cancerous) tumors or infection. °· Fallopian tubes: infection or tubal pregnancy. °· Ovary: cysts or tumors. °· Pelvic adhesions (scar tissue). °· Endometriosis (uterus lining tissue growing in the pelvis and on the pelvic organs). °· Pelvic congestion syndrome (female organs filling up with blood just before the menstrual period). °· Pain with the menstrual period. °· Pain with ovulation (producing an egg). °· Pain with an IUD (intrauterine device, birth control) in the uterus. °· Cancer of the female organs. °· Functional pain (pain not caused by a disease, may improve without treatment). °· Psychological pain. °· Depression. °DIAGNOSIS  °Your doctor will decide the seriousness of your pain by doing an examination. °· Blood tests. °· X-rays. °· Ultrasound. °· CT scan (computed tomography, special type of X-ray). °· MRI (magnetic resonance imaging). °· Cultures, for infection. °· Barium enema (dye inserted in the large intestine, to better view it with  X-rays). °· Colonoscopy (looking in intestine with a lighted tube). °· Laparoscopy (minor surgery, looking in abdomen with a lighted tube). °· Major abdominal exploratory surgery (looking in abdomen with a large incision). °TREATMENT  °The treatment will depend on the cause of the pain.  °· Many cases can be observed and treated at home. °· Over-the-counter medicines recommended by your caregiver. °· Prescription medicine. °· Antibiotics, for infection. °· Birth control pills, for painful periods or for ovulation pain. °· Hormone treatment, for endometriosis. °· Nerve blocking injections. °· Physical therapy. °· Antidepressants. °· Counseling with a psychologist or psychiatrist. °· Minor or major surgery. °HOME CARE INSTRUCTIONS  °· Do not take laxatives, unless directed by your caregiver. °· Take over-the-counter pain medicine only if ordered by your caregiver. Do not take aspirin because it can cause an upset stomach or bleeding. °· Try a clear liquid diet (broth or water) as ordered by your caregiver. Slowly move to a bland diet, as tolerated, if the pain is related to the stomach or intestine. °· Have a thermometer and take your temperature several times a day, and record it. °· Bed rest and sleep, if it helps the pain. °· Avoid sexual intercourse, if it causes pain. °· Avoid stressful situations. °· Keep your follow-up appointments and tests, as your caregiver orders. °· If the pain does not go away with medicine or surgery, you may try: °· Acupuncture. °· Relaxation exercises (yoga, meditation). °· Group therapy. °· Counseling. °SEEK MEDICAL CARE IF:  °· You notice certain foods cause stomach pain. °· Your home care treatment is not helping your pain. °· You need stronger pain medicine. °· You want your IUD removed. °· You feel faint or   lightheaded. °· You develop nausea and vomiting. °· You develop a rash. °· You are having side effects or an allergy to your medicine. °SEEK IMMEDIATE MEDICAL CARE IF:  °· Your  pain does not go away or gets worse. °· You have a fever. °· Your pain is felt only in portions of the abdomen. The right side could possibly be appendicitis. The left lower portion of the abdomen could be colitis or diverticulitis. °· You are passing blood in your stools (bright red or black tarry stools, with or without vomiting). °· You have blood in your urine. °· You develop chills, with or without a fever. °· You pass out. °MAKE SURE YOU:  °· Understand these instructions. °· Will watch your condition. °· Will get help right away if you are not doing well or get worse. °Document Released: 12/22/2006 Document Revised: 05/19/2011 Document Reviewed: 01/11/2009 °ExitCare® Patient Information ©2014 [employer]. ° °

## 2013-06-15 NOTE — ED Provider Notes (Signed)
Medical screening examination/treatment/procedure(s) were performed by non-physician practitioner and as supervising physician I was immediately available for consultation/collaboration.   EKG Interpretation None       Teressa Lower, MD 06/15/13 0530

## 2013-07-14 ENCOUNTER — Ambulatory Visit (INDEPENDENT_AMBULATORY_CARE_PROVIDER_SITE_OTHER): Payer: PRIVATE HEALTH INSURANCE | Admitting: Emergency Medicine

## 2013-07-14 ENCOUNTER — Ambulatory Visit: Payer: PRIVATE HEALTH INSURANCE

## 2013-07-14 VITALS — BP 110/68 | HR 99 | Temp 98.2°F | Resp 16 | Ht 62.0 in | Wt 159.0 lb

## 2013-07-14 DIAGNOSIS — S9030XA Contusion of unspecified foot, initial encounter: Secondary | ICD-10-CM

## 2013-07-14 NOTE — Progress Notes (Addendum)
[facility] [address], [place] [address] [phone]  Date:  07/14/2013   Name:  Courtney [name]   DOB:  1971-01-10   MRN:  [id number]  PCP:  Angelica Chessman, MD    Chief Complaint: Foot Injury, Abdominal Pain and Stress   History of Present Illness:  Courtney [name] is a 43 y.o. very pleasant female patient who presents with the following:  Patient to the office after dropping a weight on her foot.  Has pain in her "entire" foot.  She said she grazed her abdomen when the weight fell and she has an abrasion.  She claims a prior history of use of xanax for anxiety but has not had it prescribed.  Her daughter died in an accident a week ago and she has been buying xanax on the street and she wants a prescription.  No improvement with over the counter medications or other home remedies. Denies other complaint or health concern today.   Patient Active Problem List   Diagnosis Date Noted  . Suicidal ideation 05/24/2013  . Fibromyalgia 12/03/2012  . Anxiety state, unspecified 11/01/2012  . Hot flushes, perimenopausal 11/01/2012    Past Medical History  Diagnosis Date  . DJD (degenerative joint disease)   . Scoliosis   . Kidney stones   . Anxiety   . Cancer     ovarian  . Ovarian cancer dx'd 2005  . Substance abuse     Vicodin  . Bipolar 1 disorder   . Depression   . Fibromyalgia     Past Surgical History  Procedure Laterality Date  . Abdominal hysterectomy    . Tubal ligation      History  Substance Use Topics  . Smoking status: Current Every Day Smoker -- 1.00 packs/day    Types: Cigarettes  . Smokeless tobacco: Never Used  . Alcohol Use: Yes     Comment: Rarely    Family History  Problem Relation Age of Onset  . Heart failure Mother   . Heart failure Father     Allergies  Allergen Reactions  . Morphine And Related Itching  . Penicillins Anaphylaxis, Hives and Swelling  . Phenergan [Promethazine Hcl]   . Zofran Itching and  Other (See Comments)    "makes me feel funny"    Medication list has been reviewed and updated.  Current Outpatient Prescriptions on File Prior to Visit  Medication Sig Dispense Refill  . estradiol (ESTRACE) 1 MG tablet Take 1 tablet (1 mg total) by mouth daily.  30 tablet  3  . famotidine (PEPCID) 20 MG tablet Take 1 tablet (20 mg total) by mouth 2 (two) times daily.  30 tablet  0  . FLUoxetine (PROZAC) 40 MG capsule Take 40 mg by mouth daily.      Marland Kitchen ibuprofen (ADVIL,MOTRIN) 800 MG tablet Take 1 tablet (800 mg total) by mouth every 8 (eight) hours as needed for mild pain or moderate pain.  15 tablet  0  . polyethylene glycol (MIRALAX) packet Take 17 g by mouth daily.  3 each  0  . pregabalin (LYRICA) 75 MG capsule Take 1 capsule (75 mg total) by mouth 2 (two) times daily.  30 capsule  3   No current facility-administered medications on file prior to visit.    Review of Systems:  As per HPI, otherwise negative.    Physical Examination: Filed Vitals:   07/14/13 1853  BP: 110/68  Pulse: 99  Temp: 98.2 F (  36.8 C)  Resp: 16   Filed Vitals:   07/14/13 1853  Height: 5\' 2"  (1.575 m)  Weight: 159 lb (72.122 kg)   Body mass index is 29.07 kg/(m^2). Ideal Body Weight: Weight in (lb) to have BMI = 25: 136.4  GEN: WDWN, disheveled NAD, Non-toxic, A & O x 3 HEENT: Atraumatic, Normocephalic.  Ears and Nose: No external deformity.  ABD: S, NT, ND, abrasion abdominal wall EXTR: No c/c/e  Contusion forefoot NEURO Normal gait.  PSYCH:argumentative and abusive   Assessment and Plan: She has an extensive history of drug seeking behavior and is dishonest about records in the chart, denying ever using any of the illicit drugs found in her drug screen this March with the exception of marijuana.  Based upon her records and the note in [place] as well as her statement that she was buying drugs on the street, I will not provide her with any controlled drugs. 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  Signed,  Ellison Carwin, MD   [facility] reading (PRIMARY) by  Dr. Ouida Sills.  negative.  2

## 2013-07-14 NOTE — Patient Instructions (Signed)
Foot Contusion °A foot contusion is a deep bruise to the foot. Contusions are the result of an injury that caused bleeding under the skin. The contusion may turn blue, purple, or yellow. Minor injuries will give you a painless contusion, but more severe contusions may stay painful and swollen for a few weeks. °CAUSES  °A foot contusion comes from a direct blow to that area, such as a heavy object falling on the foot. °SYMPTOMS  °· Swelling of the foot. °· Discoloration of the foot. °· Tenderness or soreness of the foot. °DIAGNOSIS  °You will have a physical exam and will be asked about your history. You may need an X-ray of your foot to look for a broken bone (fracture).  °TREATMENT  °An elastic wrap may be recommended to support your foot. Resting, elevating, and applying cold compresses to your foot are often the best treatments for a foot contusion. Over-the-counter medicines may also be recommended for pain control. °HOME CARE INSTRUCTIONS  °· Put ice on the injured area. °· Put ice in a plastic bag. °· Place a towel between your skin and the bag. °· Leave the ice on for 15-20 minutes, 03-04 times a day. °· Only take over-the-counter or prescription medicines for pain, discomfort, or fever as directed by your caregiver. °· If told, use an elastic wrap as directed. This can help reduce swelling. You may remove the wrap for sleeping, showering, and bathing. If your toes become numb, cold, or blue, take the wrap off and reapply it more loosely. °· Elevate your foot with pillows to reduce swelling. °· Try to avoid standing or walking while the foot is painful. Do not resume use until instructed by your caregiver. Then, begin use gradually. If pain develops, decrease use. Gradually increase activities that do not cause discomfort until you have normal use of your foot. °· See your caregiver as directed. It is very important to keep all follow-up appointments in order to avoid any lasting problems with your foot,  including long-term (chronic) pain. °SEEK IMMEDIATE MEDICAL CARE IF:  °· You have increased redness, swelling, or pain in your foot. °· Your swelling or pain is not relieved with medicines. °· You have loss of feeling in your foot or are unable to move your toes. °· Your foot turns cold or blue. °· You have pain when you move your toes. °· Your foot becomes warm to the touch. °· Your contusion does not improve in 2 days. °MAKE SURE YOU:  °· Understand these instructions. °· Will watch your condition. °· Will get help right away if you are not doing well or get worse. °Document Released: 12/16/2005 Document Revised: 08/26/2011 Document Reviewed: 01/28/2011 °ExitCare® Patient Information ©2014 [employer]. ° °

## 2013-08-03 ENCOUNTER — Emergency Department (HOSPITAL_COMMUNITY)
Admission: EM | Admit: 2013-08-03 | Discharge: 2013-08-03 | Discharge status: Against Medical Advice | Payer: PRIVATE HEALTH INSURANCE | Attending: Emergency Medicine | Admitting: Emergency Medicine

## 2013-08-03 DIAGNOSIS — F17200 Nicotine dependence, unspecified, uncomplicated: Secondary | ICD-10-CM | POA: Insufficient documentation

## 2013-08-03 DIAGNOSIS — F411 Generalized anxiety disorder: Secondary | ICD-10-CM | POA: Insufficient documentation

## 2013-08-03 NOTE — ED Notes (Signed)
Pt very anxious upon arrival with EMS  Pt states she did not want to come here and that she does not want to stay  Explained to pt that she is free to leave is she wants  Pt walked out of the lobby doors

## 2013-08-03 NOTE — ED Notes (Signed)
Pt did not return to emergency room

## 2013-08-28 ENCOUNTER — Emergency Department (HOSPITAL_COMMUNITY)
Admission: EM | Admit: 2013-08-28 | Discharge: 2013-08-28 | Disposition: A | Discharge status: Home / Self Care | Payer: PRIVATE HEALTH INSURANCE | Attending: Emergency Medicine | Admitting: Emergency Medicine

## 2013-08-28 ENCOUNTER — Encounter (HOSPITAL_COMMUNITY): Payer: Self-pay | Admitting: Emergency Medicine

## 2013-08-28 DIAGNOSIS — Z8543 Personal history of malignant neoplasm of ovary: Secondary | ICD-10-CM | POA: Insufficient documentation

## 2013-08-28 DIAGNOSIS — F411 Generalized anxiety disorder: Secondary | ICD-10-CM | POA: Insufficient documentation

## 2013-08-28 DIAGNOSIS — F17200 Nicotine dependence, unspecified, uncomplicated: Secondary | ICD-10-CM | POA: Insufficient documentation

## 2013-08-28 DIAGNOSIS — Z79899 Other long term (current) drug therapy: Secondary | ICD-10-CM | POA: Insufficient documentation

## 2013-08-28 DIAGNOSIS — M545 Low back pain, unspecified: Secondary | ICD-10-CM | POA: Insufficient documentation

## 2013-08-28 DIAGNOSIS — G8929 Other chronic pain: Secondary | ICD-10-CM | POA: Insufficient documentation

## 2013-08-28 DIAGNOSIS — M549 Dorsalgia, unspecified: Secondary | ICD-10-CM

## 2013-08-28 DIAGNOSIS — F319 Bipolar disorder, unspecified: Secondary | ICD-10-CM | POA: Insufficient documentation

## 2013-08-28 DIAGNOSIS — Z880 Allergy status to penicillin: Secondary | ICD-10-CM | POA: Insufficient documentation

## 2013-08-28 DIAGNOSIS — M1990 Unspecified osteoarthritis, unspecified site: Secondary | ICD-10-CM | POA: Insufficient documentation

## 2013-08-28 DIAGNOSIS — Z87442 Personal history of urinary calculi: Secondary | ICD-10-CM | POA: Insufficient documentation

## 2013-08-28 DIAGNOSIS — Z760 Encounter for issue of repeat prescription: Secondary | ICD-10-CM | POA: Insufficient documentation

## 2013-08-28 NOTE — ED Provider Notes (Signed)
Medical screening examination/treatment/procedure(s) were performed by non-physician practitioner and as supervising physician I was immediately available for consultation/collaboration.   EKG Interpretation None        Delice Bison Nadalie Laughner, DO 08/28/13 1355

## 2013-08-28 NOTE — ED Notes (Signed)
Upon our [place]., Courtney [name] informed her that she did not have a current emergent condition; she quickly bolted out the door, refusing any further discourse with [place] and refusing to take her instructions or sign for them.
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
## 2013-08-28 NOTE — Discharge Instructions (Signed)
Please follow up with your doctor for medication refill.  Chronic Back Pain  When back pain lasts longer than 3 months, it is called chronic back pain.People with chronic back pain often go through certain periods that are more intense (flare-ups).  CAUSES Chronic back pain can be caused by wear and tear (degeneration) on different structures in your back. These structures include:  The bones of your spine (vertebrae) and the joints surrounding your spinal cord and nerve roots (facets).  The strong, fibrous tissues that connect your vertebrae (ligaments). Degeneration of these structures may result in pressure on your nerves. This can lead to constant pain. HOME CARE INSTRUCTIONS  Avoid bending, heavy lifting, prolonged sitting, and activities which make the problem worse.  Take brief periods of rest throughout the day to reduce your pain. Lying down or standing usually is better than sitting while you are resting.  Take over-the-counter or prescription medicines only as directed by your caregiver. SEEK IMMEDIATE MEDICAL CARE IF:   You have weakness or numbness in one of your legs or feet.  You have trouble controlling your bladder or bowels.  You have nausea, vomiting, abdominal pain, shortness of breath, or fainting. Document Released: 04/03/2004 Document Revised: 05/19/2011 Document Reviewed: 02/08/2011 [facility] Patient Information 2015 [place], [place]. This information is not intended to replace advice given to you by your health care provider. Make sure you discuss any questions you have with your health care provider.
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
## 2013-09-05 ENCOUNTER — Emergency Department (HOSPITAL_COMMUNITY)
Admission: EM | Admit: 2013-09-05 | Discharge: 2013-09-05 | Disposition: A | Discharge status: Home / Self Care | Payer: PRIVATE HEALTH INSURANCE | Attending: Emergency Medicine | Admitting: Emergency Medicine

## 2013-09-05 ENCOUNTER — Encounter (HOSPITAL_COMMUNITY): Payer: Self-pay | Admitting: Emergency Medicine

## 2013-09-05 DIAGNOSIS — Z880 Allergy status to penicillin: Secondary | ICD-10-CM | POA: Insufficient documentation

## 2013-09-05 DIAGNOSIS — F319 Bipolar disorder, unspecified: Secondary | ICD-10-CM | POA: Insufficient documentation

## 2013-09-05 DIAGNOSIS — Z8543 Personal history of malignant neoplasm of ovary: Secondary | ICD-10-CM | POA: Insufficient documentation

## 2013-09-05 DIAGNOSIS — Z87442 Personal history of urinary calculi: Secondary | ICD-10-CM | POA: Insufficient documentation

## 2013-09-05 DIAGNOSIS — F411 Generalized anxiety disorder: Secondary | ICD-10-CM | POA: Insufficient documentation

## 2013-09-05 DIAGNOSIS — IMO0002 Reserved for concepts with insufficient information to code with codable children: Secondary | ICD-10-CM | POA: Insufficient documentation

## 2013-09-05 DIAGNOSIS — M1990 Unspecified osteoarthritis, unspecified site: Secondary | ICD-10-CM | POA: Insufficient documentation

## 2013-09-05 DIAGNOSIS — M25569 Pain in unspecified knee: Secondary | ICD-10-CM | POA: Insufficient documentation

## 2013-09-05 DIAGNOSIS — IMO0001 Reserved for inherently not codable concepts without codable children: Secondary | ICD-10-CM | POA: Insufficient documentation

## 2013-09-05 DIAGNOSIS — M25562 Pain in left knee: Secondary | ICD-10-CM

## 2013-09-05 DIAGNOSIS — F17200 Nicotine dependence, unspecified, uncomplicated: Secondary | ICD-10-CM | POA: Insufficient documentation

## 2013-09-05 DIAGNOSIS — M25561 Pain in right knee: Secondary | ICD-10-CM

## 2013-09-05 DIAGNOSIS — G8929 Other chronic pain: Secondary | ICD-10-CM | POA: Insufficient documentation

## 2013-09-05 MED ORDER — HYDROCODONE-ACETAMINOPHEN 5-325 MG PO TABS
1.0000 | ORAL_TABLET | Freq: Once | ORAL | Status: AC
  Administered 2013-09-05: 1 via ORAL
  Filled 2013-09-05: qty 1
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
## 2013-09-07 NOTE — ED Provider Notes (Signed)
Medical screening examination/treatment/procedure(s) were performed by non-physician practitioner and as supervising physician I was immediately available for consultation/collaboration.   Houston Siren III, MD 09/07/13 [phone]

## 2013-09-24 ENCOUNTER — Emergency Department (HOSPITAL_COMMUNITY)
Admission: EM | Admit: 2013-09-24 | Discharge: 2013-09-24 | Discharge status: Against Medical Advice | Payer: PRIVATE HEALTH INSURANCE | Attending: Emergency Medicine | Admitting: Emergency Medicine

## 2013-09-24 ENCOUNTER — Encounter (HOSPITAL_COMMUNITY): Payer: Self-pay | Admitting: Emergency Medicine

## 2013-09-24 ENCOUNTER — Emergency Department (HOSPITAL_COMMUNITY): Payer: PRIVATE HEALTH INSURANCE

## 2013-09-24 DIAGNOSIS — F319 Bipolar disorder, unspecified: Secondary | ICD-10-CM | POA: Insufficient documentation

## 2013-09-24 DIAGNOSIS — Z87442 Personal history of urinary calculi: Secondary | ICD-10-CM | POA: Insufficient documentation

## 2013-09-24 DIAGNOSIS — R109 Unspecified abdominal pain: Secondary | ICD-10-CM | POA: Insufficient documentation

## 2013-09-24 DIAGNOSIS — F1210 Cannabis abuse, uncomplicated: Secondary | ICD-10-CM | POA: Insufficient documentation

## 2013-09-24 DIAGNOSIS — R112 Nausea with vomiting, unspecified: Secondary | ICD-10-CM | POA: Insufficient documentation

## 2013-09-24 DIAGNOSIS — Z8739 Personal history of other diseases of the musculoskeletal system and connective tissue: Secondary | ICD-10-CM | POA: Insufficient documentation

## 2013-09-24 DIAGNOSIS — F1110 Opioid abuse, uncomplicated: Secondary | ICD-10-CM | POA: Insufficient documentation

## 2013-09-24 DIAGNOSIS — R319 Hematuria, unspecified: Secondary | ICD-10-CM | POA: Insufficient documentation

## 2013-09-24 DIAGNOSIS — Z90710 Acquired absence of both cervix and uterus: Secondary | ICD-10-CM | POA: Insufficient documentation

## 2013-09-24 DIAGNOSIS — F17200 Nicotine dependence, unspecified, uncomplicated: Secondary | ICD-10-CM | POA: Insufficient documentation

## 2013-09-24 DIAGNOSIS — Z792 Long term (current) use of antibiotics: Secondary | ICD-10-CM | POA: Insufficient documentation

## 2013-09-24 DIAGNOSIS — Z79899 Other long term (current) drug therapy: Secondary | ICD-10-CM | POA: Insufficient documentation

## 2013-09-24 DIAGNOSIS — F1510 Other stimulant abuse, uncomplicated: Secondary | ICD-10-CM | POA: Insufficient documentation

## 2013-09-24 DIAGNOSIS — N390 Urinary tract infection, site not specified: Secondary | ICD-10-CM | POA: Insufficient documentation

## 2013-09-24 DIAGNOSIS — Z9851 Tubal ligation status: Secondary | ICD-10-CM | POA: Insufficient documentation

## 2013-09-24 DIAGNOSIS — Z8543 Personal history of malignant neoplasm of ovary: Secondary | ICD-10-CM | POA: Insufficient documentation

## 2013-09-24 DIAGNOSIS — Z880 Allergy status to penicillin: Secondary | ICD-10-CM | POA: Insufficient documentation

## 2013-09-24 DIAGNOSIS — G8929 Other chronic pain: Secondary | ICD-10-CM | POA: Insufficient documentation

## 2013-09-24 DIAGNOSIS — F1410 Cocaine abuse, uncomplicated: Secondary | ICD-10-CM | POA: Insufficient documentation

## 2013-09-24 DIAGNOSIS — F1310 Sedative, hypnotic or anxiolytic abuse, uncomplicated: Secondary | ICD-10-CM | POA: Insufficient documentation

## 2013-09-24 DIAGNOSIS — F411 Generalized anxiety disorder: Secondary | ICD-10-CM | POA: Insufficient documentation

## 2013-09-24 LAB — COMPREHENSIVE METABOLIC PANEL
ALT: 65 U/L — ABNORMAL HIGH (ref 0–35)
ANION GAP: 18 — AB (ref 5–15)
AST: 57 U/L — ABNORMAL HIGH (ref 0–37)
Albumin: 4.6 g/dL (ref 3.5–5.2)
Alkaline Phosphatase: 72 U/L (ref 39–117)
BILIRUBIN TOTAL: 0.6 mg/dL (ref 0.3–1.2)
BUN: 17 mg/dL (ref 6–23)
CHLORIDE: 99 meq/L (ref 96–112)
CO2: 22 meq/L (ref 19–32)
CREATININE: 0.76 mg/dL (ref 0.50–1.10)
Calcium: 9.8 mg/dL (ref 8.4–10.5)
GFR calc Af Amer: >90 mL/min (ref >90)
GFR calc non Af Amer: >90 mL/min (ref >90)
GLUCOSE: 111 mg/dL — AB (ref 70–99)
Potassium: 3.9 mEq/L (ref 3.7–5.3)
Sodium: 139 mEq/L (ref 137–147)
Total Protein: 8.1 g/dL (ref 6.0–8.3)

## 2013-09-24 LAB — RAPID URINE DRUG SCREEN, HOSP PERFORMED
Amphetamines: POSITIVE — AB
Barbiturates: NOT DETECTED
Benzodiazepines: POSITIVE — AB
Cocaine: POSITIVE — AB
OPIATES: POSITIVE — AB
Tetrahydrocannabinol: POSITIVE — AB

## 2013-09-24 LAB — CBC WITH DIFFERENTIAL/PLATELET
Basophils Absolute: 0 10*3/uL (ref 0.0–0.1)
Basophils Relative: 0 % (ref 0–1)
Eosinophils Absolute: 0.1 10*3/uL (ref 0.0–0.7)
Eosinophils Relative: 0 % (ref 0–5)
HEMATOCRIT: 43.3 % (ref 36.0–46.0)
HEMOGLOBIN: 15.2 g/dL — AB (ref 12.0–15.0)
LYMPHS ABS: 1.7 10*3/uL (ref 0.7–4.0)
LYMPHS PCT: 12 % (ref 12–46)
MCH: 31.7 pg (ref 26.0–34.0)
MCHC: 35.1 g/dL (ref 30.0–36.0)
MCV: 90.2 fL (ref 78.0–100.0)
MONOS PCT: 5 % (ref 3–12)
Monocytes Absolute: 0.6 10*3/uL (ref 0.1–1.0)
NEUTROS ABS: 11.4 10*3/uL — AB (ref 1.7–7.7)
Neutrophils Relative %: 83 % — ABNORMAL HIGH (ref 43–77)
Platelets: 260 10*3/uL (ref 150–400)
RBC: 4.8 MIL/uL (ref 3.87–5.11)
RDW: 12.1 % (ref 11.5–15.5)
WBC: 13.8 10*3/uL — ABNORMAL HIGH (ref 4.0–10.5)

## 2013-09-24 LAB — URINE MICROSCOPIC-ADD ON

## 2013-09-24 LAB — URINALYSIS, ROUTINE W REFLEX MICROSCOPIC
GLUCOSE, UA: 250 mg/dL — AB
Hgb urine dipstick: NEGATIVE
Nitrite: POSITIVE — AB
PH: 5 (ref 5.0–8.0)
Protein, ur: >300 mg/dL — AB
Specific Gravity, Urine: >1.046 — ABNORMAL HIGH (ref 1.005–1.030)
Urobilinogen, UA: >8 mg/dL — ABNORMAL HIGH (ref 0.0–1.0)

## 2013-09-24 MED ORDER — ONDANSETRON HCL 4 MG/2ML IJ SOLN: 4.0000 mg | Freq: Once | INTRAMUSCULAR | Status: DC

## 2013-09-24 MED ORDER — OXYCODONE-ACETAMINOPHEN 5-325 MG PO TABS: 1.0000 | ORAL_TABLET | Freq: Once | ORAL | Status: DC

## 2013-09-24 MED ORDER — HYDROCODONE-ACETAMINOPHEN 5-325 MG PO TABS
1.0000 | ORAL_TABLET | Freq: Once | ORAL | Status: AC
  Administered 2013-09-24: 1 via ORAL
  Filled 2013-09-24: qty 1

## 2013-09-24 MED ORDER — METOCLOPRAMIDE HCL 5 MG/ML IJ SOLN
10.0000 mg | Freq: Once | INTRAMUSCULAR | Status: AC
  Administered 2013-09-24: 10 mg via INTRAVENOUS
  Filled 2013-09-24: qty 2

## 2013-09-24 NOTE — ED Notes (Signed)
Per EMS - pt comes from home with c/o bilateral flank pain. Pt was dx with a UTI on Monday past.  Pt also c/o N/V.  Vitals on scene were WNL (116/70, HR 90).  CBG 113. Pt rates pain 10/10.  Pt has 20 gauge in RAC,but did not receive meds en route.

## 2013-09-24 NOTE — ED Notes (Signed)
Bed: UP10 Expected date:  Expected time:  Means of arrival:  Comments: EMS Flank pain
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
## 2013-09-24 NOTE — ED Notes (Signed)
Pt states she was dx with a UTI on Monday past by PCP and is taking Cipro.  Pt c/o 10/10 pain in bilateral flanks.  Pt is taking Pyridium, so unable to state if blood in her urine currently, but states it was prior to dx of UTI.  Pt denies fever, but endorses N/V.  Pt is wretching in ED with little emesis output.

## 2013-09-24 NOTE — ED Provider Notes (Signed)
CSN: [id number]     Arrival date & time 09/24/13  1054 History   First MD Initiated Contact with Patient 09/24/13 1102     Chief Complaint  Patient presents with  . Flank Pain     (Consider location/radiation/quality/duration/timing/severity/associated sxs/prior Treatment) HPI Courtney [name] is a 43 y.o. female with history of chronic pain, bipolar, fibromyalgia, who presents to ED with complaint of bilateral flank pain. Pt states she has had flank pain for a week. States it has not been as severe and started with urinary symptoms. States went to her PCP 5 days ago, diagnosed with UTI, was started on cipro and pyridium. Pt taking both medications with no relief in her symptoms. States today began having nausea, vomiting. Unable to keep anything down. States her urine is bloody.  Denies fever, chills. States no hx of kidney stones.   Past Medical History  Diagnosis Date  . DJD (degenerative joint disease)   . Scoliosis   . Kidney stones   . Anxiety   . Cancer     ovarian  . Ovarian cancer dx'd 2005  . Substance abuse     Vicodin  . Bipolar 1 disorder   . Depression   . Fibromyalgia    Past Surgical History  Procedure Laterality Date  . Abdominal hysterectomy    . Tubal ligation     Family History  Problem Relation Age of Onset  . Heart failure Mother   . Heart failure Father    History  Substance Use Topics  . Smoking status: Current Every Day Smoker -- 1.00 packs/day    Types: Cigarettes  . Smokeless tobacco: Never Used  . Alcohol Use: Yes     Comment: Rarely   OB History   Grav Para Term Preterm Abortions TAB SAB Ect Mult Living   5 5 5  0 0 0 0 0 0 5     Review of Systems  Constitutional: Negative for fever and chills.  Respiratory: Negative for cough, chest tightness and shortness of breath.   Cardiovascular: Negative for chest pain, palpitations and leg swelling.  Gastrointestinal: Positive for nausea, vomiting and abdominal pain. Negative for diarrhea.   Genitourinary: Positive for dysuria, hematuria and flank pain. Negative for vaginal bleeding, vaginal discharge, vaginal pain and pelvic pain.  Musculoskeletal: Negative for arthralgias, myalgias, neck pain and neck stiffness.  Skin: Negative for rash.  Neurological: Negative for dizziness, weakness and headaches.  All other systems reviewed and are negative.     Allergies  Morphine and related; Penicillins; Toradol; and Zofran  Home Medications   Prior to Admission medications   Medication Sig Start Date End Date Taking? Authorizing Provider  acetaminophen (TYLENOL) 325 MG tablet Take 650 mg by mouth every 6 (six) hours as needed for mild pain.   Yes Historical Provider, MD  albuterol (PROVENTIL HFA;VENTOLIN HFA) 108 (90 BASE) MCG/ACT inhaler Inhale into the lungs every 6 (six) hours as needed for wheezing or shortness of breath.   Yes Historical Provider, MD  ciprofloxacin (CIPRO) 250 MG tablet Take 250 mg by mouth 2 (two) times daily. For 7 days 09/20/13  Yes Historical Provider, MD  clonazePAM (KLONOPIN) 1 MG tablet Take 1 mg by mouth 3 (three) times daily as needed for anxiety.   Yes Historical Provider, MD  estradiol (ESTRACE) 1 MG tablet Take 1 mg by mouth every morning.   Yes Historical Provider, MD  gabapentin (NEURONTIN) 300 MG capsule Take 300 mg by mouth 2 (two) times daily.  Yes Historical Provider, MD  phenazopyridine (PYRIDIUM) 200 MG tablet Take 200 mg by mouth 3 (three) times daily as needed for pain.   Yes Historical Provider, MD  tiZANidine (ZANAFLEX) 4 MG tablet Take 4 mg by mouth every 8 (eight) hours as needed for muscle spasms.   Yes Historical Provider, MD  traZODone (DESYREL) 100 MG tablet Take 100 mg by mouth at bedtime.   Yes Historical Provider, MD   BP 109/77  Pulse 81  Temp(Src) 98.4 F (36.9 C) (Oral)  Resp 20  SpO2 98%  LMP 03/10/2005 Physical Exam  Nursing note and vitals reviewed. Constitutional: She appears well-developed and well-nourished.   Crying and tossing around bed  HENT:  Head: Normocephalic.  Eyes: Conjunctivae are normal.  Neck: Neck supple.  Cardiovascular: Normal rate, regular rhythm and normal heart sounds.   Pulmonary/Chest: Effort normal and breath sounds normal. No respiratory distress. She has no wheezes. She has no rales.  Abdominal: Soft. Bowel sounds are normal. She exhibits no distension. There is tenderness. There is no rebound.  Diffuse abdominal tenderness, bilateral CVA tenderness  Musculoskeletal: She exhibits no edema.  Neurological: She is alert.  Skin: Skin is warm and dry.  Psychiatric: She has a normal mood and affect. Her behavior is normal.    ED Course  Procedures (including critical care time) Labs Review Labs Reviewed  URINALYSIS, ROUTINE W REFLEX MICROSCOPIC - Abnormal; Notable for the following:    Color, Urine RED (*)    APPearance CLOUDY (*)    Specific Gravity, Urine >1.046 (*)    Glucose, UA 250 (*)    Bilirubin Urine LARGE (*)    Ketones, ur >80 (*)    Protein, ur >300 (*)    Urobilinogen, UA >8.0 (*)    Nitrite POSITIVE (*)    Leukocytes, UA LARGE (*)    All other components within normal limits  CBC WITH DIFFERENTIAL - Abnormal; Notable for the following:    WBC 13.8 (*)    Hemoglobin 15.2 (*)    Neutrophils Relative % 83 (*)    Neutro Abs 11.4 (*)    All other components within normal limits  COMPREHENSIVE METABOLIC PANEL - Abnormal; Notable for the following:    Glucose, Bld 111 (*)    AST 57 (*)    ALT 65 (*)    Anion gap 18 (*)    All other components within normal limits  URINE RAPID DRUG SCREEN (HOSP PERFORMED) - Abnormal; Notable for the following:    Opiates POSITIVE (*)    Cocaine POSITIVE (*)    Benzodiazepines POSITIVE (*)    Amphetamines POSITIVE (*)    Tetrahydrocannabinol POSITIVE (*)    All other components within normal limits  URINE MICROSCOPIC-ADD ON - Abnormal; Notable for the following:    Bacteria, UA FEW (*)    All other components  within normal limits    Imaging Review No results found.   EKG Interpretation None      MDM   Final diagnoses:  Flank pain   Pt here with hematuria and bilateral flank pain. Pt is hysterical in the room, crying out and calling call bell every 1 min. Pt is afebrile. VS normal. Concerning for pyelonephritis vs kidney stone. Pt with prior drug seeking behavior. She is requesting IV dilaudid and phenergan. She has allergies to toradol and zofran. Asked what her allergies are, she stated "I dont like how they make me feel." Will try PO norco and reglan IV for nausea. UA,  labs ordered.    12:32 PM Pt received her medications, she was able to keep vicodin down. Urine in and out obtained. Will monitor.   Informed by RN that pt pulled her IV out and walked out of ED.   Filed Vitals:   09/24/13 1051  BP: 109/77  Pulse: 81  Temp: 98.4 F (36.9 C)  TempSrc: Oral  Resp: 20  SpO2: 98%       Renold Genta, PA-C 09/24/13 1532

## 2013-09-25 NOTE — ED Provider Notes (Signed)
Medical screening examination/treatment/procedure(s) were performed by non-physician practitioner and as supervising physician I was immediately available for consultation/collaboration.    Toribio Seiber L Sylvia Kondracki, MD 09/25/13 0706 

## 2014-01-09 ENCOUNTER — Encounter (HOSPITAL_COMMUNITY): Payer: Self-pay | Admitting: Emergency Medicine

## 2014-09-14 ENCOUNTER — Encounter (HOSPITAL_COMMUNITY): Payer: Self-pay | Admitting: Emergency Medicine

## 2014-09-14 ENCOUNTER — Emergency Department (HOSPITAL_COMMUNITY): Payer: PRIVATE HEALTH INSURANCE

## 2014-09-14 ENCOUNTER — Emergency Department (HOSPITAL_COMMUNITY)
Admission: EM | Admit: 2014-09-14 | Discharge: 2014-09-15 | Disposition: A | Discharge status: Home / Self Care | Payer: PRIVATE HEALTH INSURANCE | Attending: Emergency Medicine | Admitting: Emergency Medicine

## 2014-09-14 DIAGNOSIS — N730 Acute parametritis and pelvic cellulitis: Secondary | ICD-10-CM

## 2014-09-14 DIAGNOSIS — Z79899 Other long term (current) drug therapy: Secondary | ICD-10-CM | POA: Insufficient documentation

## 2014-09-14 DIAGNOSIS — M797 Fibromyalgia: Secondary | ICD-10-CM | POA: Insufficient documentation

## 2014-09-14 DIAGNOSIS — Z8543 Personal history of malignant neoplasm of ovary: Secondary | ICD-10-CM | POA: Insufficient documentation

## 2014-09-14 DIAGNOSIS — R109 Unspecified abdominal pain: Secondary | ICD-10-CM

## 2014-09-14 DIAGNOSIS — N739 Female pelvic inflammatory disease, unspecified: Secondary | ICD-10-CM | POA: Insufficient documentation

## 2014-09-14 DIAGNOSIS — Z880 Allergy status to penicillin: Secondary | ICD-10-CM | POA: Insufficient documentation

## 2014-09-14 DIAGNOSIS — Z87442 Personal history of urinary calculi: Secondary | ICD-10-CM | POA: Insufficient documentation

## 2014-09-14 DIAGNOSIS — F1210 Cannabis abuse, uncomplicated: Secondary | ICD-10-CM | POA: Insufficient documentation

## 2014-09-14 DIAGNOSIS — M419 Scoliosis, unspecified: Secondary | ICD-10-CM | POA: Insufficient documentation

## 2014-09-14 DIAGNOSIS — F1310 Sedative, hypnotic or anxiolytic abuse, uncomplicated: Secondary | ICD-10-CM | POA: Insufficient documentation

## 2014-09-14 DIAGNOSIS — M1990 Unspecified osteoarthritis, unspecified site: Secondary | ICD-10-CM | POA: Insufficient documentation

## 2014-09-14 DIAGNOSIS — Z720 Tobacco use: Secondary | ICD-10-CM | POA: Insufficient documentation

## 2014-09-14 DIAGNOSIS — Z8659 Personal history of other mental and behavioral disorders: Secondary | ICD-10-CM | POA: Insufficient documentation

## 2014-09-14 DIAGNOSIS — N390 Urinary tract infection, site not specified: Secondary | ICD-10-CM | POA: Insufficient documentation

## 2014-09-14 LAB — URINALYSIS, ROUTINE W REFLEX MICROSCOPIC
Bilirubin Urine: NEGATIVE
GLUCOSE, UA: NEGATIVE mg/dL
Ketones, ur: NEGATIVE mg/dL
Nitrite: NEGATIVE
PH: 5.5 (ref 5.0–8.0)
Protein, ur: NEGATIVE mg/dL
Specific Gravity, Urine: 1.02 (ref 1.005–1.030)
Urobilinogen, UA: 0.2 mg/dL (ref 0.0–1.0)

## 2014-09-14 LAB — RAPID HIV SCREEN (HIV 1/2 AB+AG)
HIV 1/2 Antibodies: NONREACTIVE
HIV-1 P24 ANTIGEN - HIV24: NONREACTIVE

## 2014-09-14 LAB — BASIC METABOLIC PANEL
Anion gap: 8 (ref 5–15)
BUN: 13 mg/dL (ref 6–20)
CHLORIDE: 102 mmol/L (ref 101–111)
CO2: 31 mmol/L (ref 22–32)
CREATININE: 0.75 mg/dL (ref 0.44–1.00)
Calcium: 9.3 mg/dL (ref 8.9–10.3)
GFR calc Af Amer: >60 mL/min (ref >60)
GFR calc non Af Amer: >60 mL/min (ref >60)
GLUCOSE: 83 mg/dL (ref 65–99)
Potassium: 4.2 mmol/L (ref 3.5–5.1)
SODIUM: 141 mmol/L (ref 135–145)

## 2014-09-14 LAB — RAPID URINE DRUG SCREEN, HOSP PERFORMED
Amphetamines: NOT DETECTED
BENZODIAZEPINES: POSITIVE — AB
Barbiturates: NOT DETECTED
COCAINE: NOT DETECTED
Opiates: NOT DETECTED
Tetrahydrocannabinol: POSITIVE — AB

## 2014-09-14 LAB — CBC
HCT: 41.6 % (ref 36.0–46.0)
Hemoglobin: 13.9 g/dL (ref 12.0–15.0)
MCH: 31.2 pg (ref 26.0–34.0)
MCHC: 33.4 g/dL (ref 30.0–36.0)
MCV: 93.3 fL (ref 78.0–100.0)
PLATELETS: 252 10*3/uL (ref 150–400)
RBC: 4.46 MIL/uL (ref 3.87–5.11)
RDW: 11.9 % (ref 11.5–15.5)
WBC: 9.4 10*3/uL (ref 4.0–10.5)

## 2014-09-14 LAB — URINE MICROSCOPIC-ADD ON

## 2014-09-14 LAB — WET PREP, GENITAL
Trich, Wet Prep: NONE SEEN
Yeast Wet Prep HPF POC: NONE SEEN

## 2014-09-14 LAB — ETHANOL

## 2014-09-14 MED ORDER — SODIUM CHLORIDE 0.9 % IV BOLUS (SEPSIS)
500.0000 mL | Freq: Once | INTRAVENOUS | Status: AC
  Administered 2014-09-14: 500 mL via INTRAVENOUS

## 2014-09-14 NOTE — ED Notes (Signed)
Pt states she is having pain in her lower back and her urine has a bad odor and her urine has blood in it   Pt states it burns with urination  Pt states she is having left flank pain that radiates around to the front and down toward her groin

## 2014-09-14 NOTE — ED Notes (Signed)
Pelvic cart at bedside. 

## 2014-09-14 NOTE — ED Notes (Signed)
Patient  difficult to arouse notified NP, Patient states that she has not been sleeping and that is why she keeps falling into a deep sleep.

## 2014-09-14 NOTE — ED Provider Notes (Signed)
CSN: [id number]     Arrival date & time 09/14/14  2026 History   First MD Initiated Contact with Patient 09/14/14 2045     Chief Complaint  Patient presents with  . Hematuria  . Back Pain     (Consider location/radiation/quality/duration/timing/severity/associated sxs/prior Treatment) Patient is a 44 y.o. female presenting with hematuria and back pain. The history is provided by the patient and medical records. No language interpreter was used.  Hematuria Associated symptoms include abdominal pain ( Radiation from back). Pertinent negatives include no chest pain, diaphoresis, fatigue, fever, headaches, nausea, rash or vomiting.  Back Pain Associated symptoms: abdominal pain ( Radiation from back) and dysuria   Associated symptoms: no chest pain, no fever and no headaches      Courtney [name] is a 44 y.o. female  with a hx of DJD, kidney stones, anxiety, ovarian cancer, narcotic abuse, bipolar, fibromyalgia, abdominal hysterectomy presents to the Emergency Department complaining of gradual, persistent, progressively worsening left-sided flank pain onset 2 days ago. Patient reports she was recently treated for a BV infection but feels that the odor persisted.   She reports that the pain today is similar to previous kidney stones but dysuria and significant bowel odor is not common. She reports that movement, palpation and almost anything she does makes her pain worse. Nothing including Tylenol and ibuprofen makes it better. She denies fever, chills, headache, neck pain, chest pain, shortness of breath, diarrhea, weakness, dizziness, syncope. Patient does report associated mild left lower quadrant abdominal pain that radiates from her left flank.  When I entered the room patient is asleep sitting in the bed with her head hanging forward, she takes calling her name multiple times and mild sternal rub to awake.    Past Medical History  Diagnosis Date  . DJD (degenerative joint disease)   . Scoliosis    . Kidney stones   . Anxiety   . Cancer     ovarian  . Ovarian cancer dx'd 2005  . Substance abuse     Vicodin  . Bipolar 1 disorder   . Depression   . Fibromyalgia    Past Surgical History  Procedure Laterality Date  . Abdominal hysterectomy    . Tubal ligation     Family History  Problem Relation Age of Onset  . Heart failure Mother   . Heart failure Father    History  Substance Use Topics  . Smoking status: Current Every Day Smoker -- 1.00 packs/day    Types: Cigarettes  . Smokeless tobacco: Never Used  . Alcohol Use: No   OB History    Gravida Para Term Preterm AB TAB SAB Ectopic Multiple Living   5 5 5  0 0 0 0 0 0 5     Review of Systems  Constitutional: Negative for fever, diaphoresis, appetite change and fatigue.  Respiratory: Negative for shortness of breath.   Cardiovascular: Negative for chest pain.  Gastrointestinal: Positive for abdominal pain ( Radiation from back). Negative for nausea, vomiting, diarrhea, constipation and blood in stool.  Genitourinary: Positive for dysuria, hematuria and flank pain. Negative for urgency, frequency and difficulty urinating.  Musculoskeletal: Positive for back pain.  Skin: Negative for rash.  Neurological: Negative for headaches.  All other systems reviewed and are negative.     Allergies  Morphine and related; Penicillins; Toradol; and Zofran  Home Medications   Prior to Admission medications   Medication Sig Start Date End Date Taking? Authorizing Provider  acetaminophen (TYLENOL) 500 MG  tablet Take 1,000 mg by mouth every 6 (six) hours as needed.   Yes Historical Provider, MD  ibuprofen (ADVIL,MOTRIN) 200 MG tablet Take 400 mg by mouth every 6 (six) hours as needed (for pain.).   Yes Historical Provider, MD  albuterol (PROVENTIL HFA;VENTOLIN HFA) 108 (90 BASE) MCG/ACT inhaler Inhale 1-2 puffs into the lungs every 6 (six) hours as needed for wheezing or shortness of breath.     Historical Provider, MD   cephALEXin (KEFLEX) 500 MG capsule Take 1 capsule (500 mg total) by mouth 4 (four) times daily. 09/15/14   Alesandra Smart, PA-C  doxycycline (VIBRAMYCIN) 100 MG capsule Take 1 capsule (100 mg total) by mouth 2 (two) times daily. 09/15/14   Tiffony Kite, PA-C   BP 103/57 mmHg  Pulse 63  Temp(Src) 97.4 F (36.3 C) (Oral)  Resp 19  SpO2 99%  LMP 03/10/2005 Physical Exam  Constitutional: She appears well-developed and well-nourished. No distress.  HENT:  Head: Normocephalic and atraumatic.  Mouth/Throat: Oropharynx is clear and moist. No oropharyngeal exudate.  Eyes: Conjunctivae are normal.  Neck: Normal range of motion.  Cardiovascular: Normal rate, regular rhythm, normal heart sounds and intact distal pulses.   No murmur heard. Pulmonary/Chest: Effort normal and breath sounds normal. No respiratory distress. She has no wheezes.  Abdominal: Soft. Bowel sounds are normal. She exhibits no distension and no mass. There is no tenderness. There is CVA tenderness (Bilateral - L > R). There is no rebound and no guarding. Hernia confirmed negative in the right inguinal area and confirmed negative in the left inguinal area.  Genitourinary: Uterus normal. No labial fusion. There is no rash, tenderness or lesion on the right labia. There is no rash, tenderness or lesion on the left labia. Uterus is not deviated, not enlarged, not fixed and not tender. Cervix exhibits motion tenderness. Cervix exhibits no discharge and no friability. Right adnexum displays no mass, no tenderness and no fullness. Left adnexum displays no mass, no tenderness and no fullness. No erythema, tenderness or bleeding in the vagina. No foreign body around the vagina. No signs of injury around the vagina. Vaginal discharge (thick, white, malodorous) found.  Musculoskeletal: Normal range of motion. She exhibits no edema.  Lymphadenopathy:       Right: No inguinal adenopathy present.       Left: No inguinal adenopathy  present.  Neurological: She is alert.  Skin: Skin is warm and dry. No rash noted. She is not diaphoretic. No erythema.  Psychiatric: She has a normal mood and affect.  Nursing note and vitals reviewed.   ED Course  Procedures (including critical care time) Labs Review Labs Reviewed  WET PREP, GENITAL - Abnormal; Notable for the following:    Clue Cells Wet Prep HPF POC MODERATE (*)    WBC, Wet Prep HPF POC FEW (*)    All other components within normal limits  URINALYSIS, ROUTINE W REFLEX MICROSCOPIC (NOT AT [facility]) - Abnormal; Notable for the following:    APPearance CLOUDY (*)    Hgb urine dipstick TRACE (*)    Leukocytes, UA SMALL (*)    All other components within normal limits  URINE RAPID DRUG SCREEN, HOSP PERFORMED - Abnormal; Notable for the following:    Benzodiazepines POSITIVE (*)    Tetrahydrocannabinol POSITIVE (*)    All other components within normal limits  URINE MICROSCOPIC-ADD ON - Abnormal; Notable for the following:    Bacteria, UA MANY (*)    All other components within normal limits  URINE CULTURE  CBC  BASIC METABOLIC PANEL  ETHANOL  RAPID HIV SCREEN (HIV 1/2 AB+AG)  GC/CHLAMYDIA PROBE AMP ([place]) NOT AT [facility]    Imaging Review Ct Renal Stone Study  09/14/2014   CLINICAL DATA:  Acute onset of left flank pain radiating into the left groin. Dysuria. Hematuria.  EXAM: CT ABDOMEN AND PELVIS WITHOUT CONTRAST  TECHNIQUE: Multidetector CT imaging of the abdomen and pelvis was performed following the standard protocol without IV contrast.  COMPARISON:  Numerous prior CT abdomen and pelvis examinations dating back to 12/16/2007, most recently 07/29/2013.  FINDINGS: Very small (approximately 2 mm) nonobstructing calculus in a mid calix of the right kidney. No obstructing right ureteral calculus. No left urinary tract calculi. Within the limits of the unenhanced technique, no focal parenchymal abnormality involving either kidney.  Normal unenhanced appearance of the  liver, spleen, pancreas, and adrenal glands. Gallbladder contracted but otherwise unremarkable. No biliary ductal dilation. No visible aortoiliofemoral atherosclerosis no significant lymphadenopathy.  Normal-appearing stomach filled with food, accounting for the contracted gallbladder. Normal-appearing small bowel. Sigmoid colon tortuous and redundant. Expected stool burden throughout normal appearing colon. Normal appendix in the right upper pelvis. No ascites.  Uterus surgically absent. No adnexal masses or free pelvic fluid. Numerous pelvic phleboliths.  Bone window images demonstrate slight thoracolumbar scoliosis convex right, disc space narrowing at L1-2 and L4-5. Visualized lung bases clear apart from the expected dependent atelectasis. Heart size normal.  IMPRESSION: 1. No acute abnormalities involving the abdomen or pelvis. 2. Very small (approximate 2 mm) nonobstructing calculus in a mid calix of the right kidney. No obstructing right ureteral calculus. No left urinary tract calculi.   Electronically Signed   By: Evangeline Dakin M.D.   On: 09/14/2014 21:47     EKG Interpretation None      MDM   Final diagnoses:  Flank pain  UTI (lower urinary tract infection)  PID (acute pelvic inflammatory disease)   Earley Favor presents with complaints of flank pain, lower back pain and foul-smelling urine. Vitals are stable on arrival. She is very sleepy but denies narcotic pain medicine at home.  She does have a history of Vicodin substance abuse.    9:57 PM CT without evidence of nephrolithiasis or renal colic. UDS, UA and ethanol levels pending.  11:40 PM Urinalysis with small leukocytes and 3-6 white blood cells with many bacteria. UA with small leukocytes and 3-6 WBCs, raises concern for possible vaginal etiology of patient's symptoms. Pelvic exam with mild CMT and large discharge.  Labs otherwise reassuring. Patient without evidence of ethanol in her system however she is positive for  benzos and marijuana. She remains very sleepy arousing only to physical stimuli.  Wet prep pending.    12:48 AM Wet prep for moderate clue cells and a few white blood cells. Given patient's persistent low back pain, dysuria and discomfort will treat as PID in addition to treating for UTI she is symptomatic. Urine culture pending. Gonorrhea and Chlamydia pending. HIV negative. Patient remains sleepy but arousable with physical stimuli. Patient treated with azithromycin, Flagyl, Rocephin and doxycycline. She will be discharged home with Keflex and doxycycline. She is well appearing. She has tolerated by mouth in the emergency department without emesis. She is afebrile, non-tachycardic and without hypotension. I believe patient is safe for discharge home and outpatient treatment of her UTI or PID while cultures pending. Should return precautions given including fevers, intractable vomiting, worsening abdominal pain or other concerns.  BP 103/57 mmHg  Pulse 63  Temp(Src) 97.4 F (36.3 C) (Oral)  Resp 19  SpO2 99%  LMP 03/10/2005   Abigail Butts, PA-C 09/15/14 [id number]  Lacretia Leigh, MD 09/15/14 [phone]

## 2014-09-15 ENCOUNTER — Telehealth: Payer: Self-pay | Admitting: *Deleted

## 2014-09-15 LAB — GC/CHLAMYDIA PROBE AMP (~~LOC~~) NOT AT ARMC
Chlamydia: NEGATIVE
Neisseria Gonorrhea: NEGATIVE

## 2014-09-15 MED ORDER — METRONIDAZOLE 500 MG PO TABS
2000.0000 mg | ORAL_TABLET | Freq: Once | ORAL | Status: AC
  Administered 2014-09-15: 2000 mg via ORAL
  Filled 2014-09-15: qty 4

## 2014-09-15 MED ORDER — AZITHROMYCIN 250 MG PO TABS
1000.0000 mg | ORAL_TABLET | Freq: Once | ORAL | Status: AC
  Administered 2014-09-15: 1000 mg via ORAL
  Filled 2014-09-15: qty 4

## 2014-09-15 MED ORDER — DEXTROSE 5 % IV SOLN
1.0000 g | Freq: Once | INTRAVENOUS | Status: AC
  Administered 2014-09-15: 1 g via INTRAVENOUS
  Filled 2014-09-15: qty 10

## 2014-09-15 MED ORDER — DOXYCYCLINE HYCLATE 100 MG PO CAPS: 100.0000 mg | ORAL_CAPSULE | Freq: Two times a day (BID) | ORAL | Status: DC

## 2014-09-15 MED ORDER — DOXYCYCLINE HYCLATE 100 MG PO TABS
100.0000 mg | ORAL_TABLET | Freq: Once | ORAL | Status: AC
  Administered 2014-09-15: 100 mg via ORAL
  Filled 2014-09-15: qty 1

## 2014-09-15 MED ORDER — CEPHALEXIN 500 MG PO CAPS: 500.0000 mg | ORAL_CAPSULE | Freq: Four times a day (QID) | ORAL | Status: DC

## 2014-09-15 NOTE — Telephone Encounter (Signed)
Pt obviously in distress and arguing with someone while talking with this writer about being unable to afford meds. D/C after EDV 09/13/14 when she was treated for PID and STI. Needs Keflex and Vibramycin. Will MATCH.

## 2014-09-15 NOTE — Discharge Instructions (Signed)
1. Medications: Keflex, doxycycline, usual home medications 2. Treatment: rest, drink plenty of fluids, advance diet slowly 3. Follow Up: Please followup with your primary doctor in 2 days for discussion of your diagnoses and further evaluation after today's visit; if you do not have a primary care doctor use the resource guide provided to find one; Please return to the ER for persistent vomiting, high fevers or worsening symptoms   Pelvic Inflammatory Disease Pelvic inflammatory disease (PID) refers to an infection in some or all of the female organs. The infection can be in the uterus, ovaries, fallopian tubes, or the surrounding tissues in the pelvis. PID can cause abdominal or pelvic pain that comes on suddenly (acute pelvic pain). PID is a serious infection because it can lead to lasting (chronic) pelvic pain or the inability to have children (infertile).  CAUSES  The infection is often caused by the normal bacteria found in the vaginal tissues. PID may also be caused by an infection that is spread during sexual contact. PID can also occur following:   The birth of a baby.   A miscarriage.   An abortion.   Major pelvic surgery.   The use of an intrauterine device (IUD).   A sexual assault.  RISK FACTORS Certain factors can put a person at higher risk for PID, such as:  Being younger than 25 years.  Being sexually active at [place] age.  Usingnonbarrier contraception.  Havingmultiple sexual partners.  Having sex with someone who has symptoms of a genital infection.  Using oral contraception. Other times, certain behaviors can increase the possibility of getting PID, such as:  Having sex during your period.  Using a vaginal douche.  Having an intrauterine device (IUD) in place. SYMPTOMS   Abdominal or pelvic pain.   Fever.   Chills.   Abnormal vaginal discharge.  Abnormal uterine bleeding.   Unusual pain shortly after finishing your  period. DIAGNOSIS  Your caregiver will choose some of the following methods to make a diagnosis, such as:   Performinga physical exam and history. A pelvic exam typically reveals a very tender uterus and surrounding pelvis.   Ordering laboratory tests including a pregnancy test, blood tests, and urine test.  Orderingcultures of the vagina and cervix to check for a sexually transmitted infection (STI).  Performing an ultrasound.   Performing a laparoscopic procedure to look inside the pelvis.  TREATMENT   Antibiotic medicines may be prescribed and taken by mouth.   Sexual partners may be treated when the infection is caused by a sexually transmitted disease (STD).   Hospitalization may be needed to give antibiotics intravenously.  Surgery may be needed, but this is rare. It may take weeks until you are completely well. If you are diagnosed with PID, you should also be checked for human immunodeficiency virus (HIV). HOME CARE INSTRUCTIONS   If given, take your antibiotics as directed. Finish the medicine even if you start to feel better.   Only take over-the-counter or prescription medicines for pain, discomfort, or fever as directed by your caregiver.   Do not have sexual intercourse until treatment is completed or as directed by your caregiver. If PID is confirmed, your recent sexual partner(s) will need treatment.   Keep your follow-up appointments. SEEK MEDICAL CARE IF:   You have increased or abnormal vaginal discharge.   You need prescription medicine for your pain.   You vomit.   You cannot take your medicines.   Your partner has an STD.  SEEK  IMMEDIATE MEDICAL CARE IF:   You have a fever.   You have increased abdominal or pelvic pain.   You have chills.   You have pain when you urinate.   You are not better after 72 hours following treatment.  MAKE SURE YOU:   Understand these instructions.  Will watch your condition.  Will get  help right away if you are not doing well or get worse. Document Released: 02/24/2005 Document Revised: 06/21/2012 Document Reviewed: 02/20/2011 [facility] Patient Information 2015 [place], [place]. This information is not intended to replace advice given to you by your health care provider. Make sure you discuss any questions you have with your health care provider.   Emergency Department Resource Guide 1) Find a Doctor and Pay Out of Pocket Although you won't have to find out who is covered by your insurance plan, it is a good idea to ask around and get recommendations. You will then need to call the office and see if the doctor you have chosen will accept you as a new patient and what types of options they offer for patients who are self-pay. Some doctors offer discounts or will set up payment plans for their patients who do not have insurance, but you will need to ask so you aren't surprised when you get to your appointment.  2) Contact Your Local Health Department Not all health departments have doctors that can see patients for sick visits, but many do, so it is worth a call to see if yours does. If you don't know where your local health department is, you can check in your phone book. The CDC also has a tool to help you locate your state's health department, and many state websites also have listings of all of their local health departments.  3) Find a [facility] If your illness is not likely to be very severe or complicated, you may want to try a walk in clinic. These are popping up all over the country in pharmacies, drugstores, and shopping centers. They're usually staffed by nurse practitioners or physician assistants that have been trained to treat common illnesses and complaints. They're usually fairly quick and inexpensive. However, if you have serious medical issues or chronic medical problems, these are probably not your best option.  No Primary Care Doctor: - Call Health Connect at   [phone] - they can help you locate a primary care doctor that  accepts your insurance, provides certain services, etc. - Physician Referral Service- [phone]  Chronic Pain Problems: Organization         Address  Phone   Notes  [facility]  [phone] Patients need to be referred by their primary care doctor.   Medication Assistance: Organization         Address  Phone   Notes  [facility] Medication [facility] [address] [place], [place] [address] [phone] --Must be a resident of [facility] -- Must have NO insurance coverage whatsoever (no Medicaid/ Medicare, etc.) -- The pt. MUST have a primary care doctor that directs their care regularly and follows them in the community   MedAssist  [phone]   [employer]  [phone]    Agencies that provide inexpensive medical care: Organization         Address  Phone   Notes  [place]  [phone]   [facility]    [phone]   [facility]  [place], [place] [address] [phone]   [place] [address], [place] [phone]   Planned Parenthood    [phone]   [facility]    [phone]   [place] and [place] [address], [place] Phone:  [phone], Fax:  [phone] Hours of Operation:  9 am - 6 pm, M-F.  Also accepts Medicaid/Medicare and self-pay.  [facility] for [place] [address], [address], [place] Phone: [phone], Fax: [phone]. Hours of Operation:  8:30 am - 5:30 pm, M-F.  Also accepts Medicaid and self-pay.  [facility] [place] [address] [place] Phone: [phone]   [place], [place], [place] [phone], Ext. [phone] Mondays & Thursdays: 7-9 AM.  First 15 patients are seen on a first come, first serve basis.     [place] Providers:  Organization         Address  Phone   Notes  [facility] [address], [place] [phone] Also accepts self-pay patients.  [facility] [address], [place]  [phone]   [place], [address], [place] [phone]   [facility] [address] [place] [phone]   Lucianne Lei [address], [place]   [phone] Only accepts [place] Access [place] patients after they have their name applied to their card.   Self-Pay (no insurance) in [facility]:  Organization         Address  Phone   Notes  Sickle Cell Patients, [facility] [place] [phone]   [facility] [place] [phone]   [facility] [place]  [place], [place], [place] [phone]   [facility]/Dr. Osei-Bonsu  [address], [place] or [address], [place] [phone] Phone number for both [place] and [place] locations is the same.  [facility] [address] [place] [phone]   [facility] [address], [place] or [address] [phone]   [facility] [address], [place] [phone], phone; [phone], fax Sees patients 1st and 3rd Saturday of every month.  Must not qualify for public or private insurance (i.e. Medicaid, Medicare, [place] Health Choice, Veterans' Benefits)  Household income should be no more than 200% of the poverty level The clinic cannot treat you if you are pregnant or think you are pregnant  Sexually transmitted diseases are not treated at the clinic.    Dental Care: Organization         Address  Phone  Notes  [facility]  Department of [facility] [place] [phone] Accepts children up to age 63 who are enrolled in [place] or [place]; pregnant women with a Medicaid card; and children who have applied for Medicaid or [place] Health Choice, but were declined, whose parents can pay a reduced fee at time of service.  [facility] Department of [facility]  [address], [place] [phone] Accepts children up to age 27 who are enrolled in [place]  or [place] Health Choice; pregnant women with a Medicaid card; and children who have applied for Medicaid or  [place] Health Choice, but were declined, whose parents can pay a reduced fee at time of service.  [facility] Adult [facility] Access PROGRAM  [place] [phone] Patients are seen by appointment only. Walk-ins are not accepted. [place] will see patients 62 years of age and older. Monday - Tuesday (8am-5pm) Most Wednesdays (8:30-5pm) $30 per visit, cash only  [facility] Adult Dental Access PROGRAM  [address], [facility] [phone] Patients are seen by appointment only. Walk-ins are not accepted. [place] will see patients 78 years of age and older. One Wednesday Evening (Monthly: Volunteer Based).  $30 per visit, cash only  [place]  [phone] for adults; Children under age 68, call Graduate Pediatric Dentistry at [phone]. Children aged 87-14, please call [phone] to request a pediatric application.  Dental services are provided in all areas of dental care including fillings, crowns and bridges, complete and partial dentures, implants, gum treatment, root canals, and extractions. Preventive care is also provided. Treatment is provided to both adults and children. Patients are selected via a lottery and there is often a waiting list.   [facility] [address] [place]  [phone]  [email or web address]   [facility] [address] [place], [place] [phone], Ext. [phone] Second and Fourth Thursday of each month, opens at 6:30 AM; Clinic ends at 9 AM.  Patients are seen on a first-come first-served basis, and a limited number are seen during each clinic.   [facility]  [address] Hillard Danker [place], [place] [phone]   Eligibility Requirements You must have lived in [place], [place], or [place] counties for at least the last three months.   You cannot be eligible for state or federal sponsored [employer], including [employer], [place], or [employer].   You generally cannot be eligible for healthcare insurance through your employer.    How to apply: Eligibility screenings are held every Tuesday and Wednesday afternoon from 1:00 pm until 4:00 pm. You do not need an appointment for the interview!  [facility] [address], [place], [place]   [place]  [place] Department  [place]  [phone]    Behavioral Health Resources in the Community: Intensive Outpatient Programs Organization         Address  Phone  Notes  [place]. [address] [place], [place] [phone]   [facility] Outpatient [address] [place], [place]   ADS: Alcohol & Drug Svcs [address] [place], [place]   [place] [address],  [place] [address], [place] or [phone]   Substance Abuse Resources Organization         Address  Phone  Notes  Alcohol and Drug Services  [phone]   [place]  [phone]   [facility] [place]   Chinita Pester  [phone]   Residential & Outpatient Substance Abuse Program  [phone]   Psychological Services Organization          Address  Phone  Notes  [facility] [place]  [place]  [phone]   [place] [address], [place] or [phone]  Mobile Crisis Teams Organization         Address  Phone  Notes  [facility]atives, Mobile Crisis Care Unit  [phone]   [facility]  [address] [place], [place]   [place] Levels [address] [place] [phone]    Self-Help/Support Groups Organization         Address  Phone             Notes  [place]. of [place] - variety of support groups  [place] Call for more information  Narcotics Anonymous (NA), [facility]aring Services [address], [employer] [place]  2 meetings at this location   Special educational needs teacher         Address  Phone  Notes  ASAP Residential Treatment [place],    [place]  [phone]   [facility]  [address], [place] [address], [place], [place]   [place], [address] [phone] Admissions: 8am-3pm M-F  Incentives Substance [place] [address],    [place], [place] [phone]   [facility] [address] [place], [place], [place]   [facility] [address]  [place], [place]   [facility] Programs - Intensive Outpatient [address], [place], [place]   [facility] ([place].) [address]  [place], [place] [phone] or [phone]   [facility]idential Treatment Services (RTS) [address], [place], [place] Accepts Medicaid  [facility] [place] [address],  [place] [phone] Substance Abuse/Addiction Treatment   [facility] Organization         Address  Phone  Notes  [facility]  [phone]   Domenic Schwab, PhD [address] Arlis Porta [place], [place]   [phone] or [phone]   [place], [place] [phone]   [facility] [address], [place], [place] [phone] Insurance/Medicaid/sponsorship through [facility] and Families [address], Ste [place]                                    [place], [place] [phone] [place] [address], [place] [phone]    Dr. Adele Schilder  [phone]   [facility] Dept. 1) [address] [place] 2) [place] 3)  [place] [address], [place] [phone]  [phone]   [place] [phone] or [phone] (After Hours)

## 2014-09-15 NOTE — Telephone Encounter (Signed)
Talked with pt about her EDV on 09/14/2014 when she was treated for STI and PID. States an Development worker, international aid" here told her she could get her RX and dyspnea s for "free at [employer]". No documentation from CM dept whatsoever. GoodRx coupon not any help as medications continued >$30.00 each. [place] program explained and offered. Pt accepted. Understands that each Rx will be $3.00 (three) dollars and this program is offered once within a 12 month period. Faxed to [employer] at [employer] per her request. Talked with pt about free STI Clinic offered through [place] on [address] M-F where she can receive these same medications/treatments free of charge. Pt adamant that she does not want to be treated there. Understands that she needs to increase fluid intake and that symptoms will not disappear overnight. No further needs at present.

## 2014-09-17 ENCOUNTER — Emergency Department (HOSPITAL_COMMUNITY)
Admission: EM | Admit: 2014-09-17 | Discharge: 2014-09-17 | Discharge status: Against Medical Advice | Payer: PRIVATE HEALTH INSURANCE | Attending: Emergency Medicine | Admitting: Emergency Medicine

## 2014-09-17 ENCOUNTER — Encounter (HOSPITAL_COMMUNITY): Payer: Self-pay | Admitting: Emergency Medicine

## 2014-09-17 DIAGNOSIS — M545 Low back pain, unspecified: Secondary | ICD-10-CM

## 2014-09-17 DIAGNOSIS — Z880 Allergy status to penicillin: Secondary | ICD-10-CM | POA: Insufficient documentation

## 2014-09-17 DIAGNOSIS — Z79899 Other long term (current) drug therapy: Secondary | ICD-10-CM | POA: Insufficient documentation

## 2014-09-17 DIAGNOSIS — N390 Urinary tract infection, site not specified: Secondary | ICD-10-CM | POA: Insufficient documentation

## 2014-09-17 DIAGNOSIS — Z8659 Personal history of other mental and behavioral disorders: Secondary | ICD-10-CM | POA: Insufficient documentation

## 2014-09-17 DIAGNOSIS — Z720 Tobacco use: Secondary | ICD-10-CM | POA: Insufficient documentation

## 2014-09-17 DIAGNOSIS — Z8543 Personal history of malignant neoplasm of ovary: Secondary | ICD-10-CM | POA: Insufficient documentation

## 2014-09-17 DIAGNOSIS — Z792 Long term (current) use of antibiotics: Secondary | ICD-10-CM | POA: Insufficient documentation

## 2014-09-17 DIAGNOSIS — Z87442 Personal history of urinary calculi: Secondary | ICD-10-CM | POA: Insufficient documentation

## 2014-09-17 LAB — URINALYSIS, ROUTINE W REFLEX MICROSCOPIC
Bilirubin Urine: NEGATIVE
GLUCOSE, UA: NEGATIVE mg/dL
HGB URINE DIPSTICK: NEGATIVE
Ketones, ur: NEGATIVE mg/dL
Nitrite: NEGATIVE
PROTEIN: NEGATIVE mg/dL
Specific Gravity, Urine: 1.016 (ref 1.005–1.030)
Urobilinogen, UA: 0.2 mg/dL (ref 0.0–1.0)
pH: 7 (ref 5.0–8.0)

## 2014-09-17 LAB — URINE MICROSCOPIC-ADD ON

## 2014-09-17 LAB — URINE CULTURE: Culture: >=100000

## 2014-09-17 MED ORDER — SULFAMETHOXAZOLE-TRIMETHOPRIM 800-160 MG PO TABS
1.0000 | ORAL_TABLET | Freq: Once | ORAL | Status: AC
  Administered 2014-09-17: 1 via ORAL
  Filled 2014-09-17: qty 1

## 2014-09-17 MED ORDER — OXYCODONE-ACETAMINOPHEN 5-325 MG PO TABS
2.0000 | ORAL_TABLET | Freq: Once | ORAL | Status: AC
  Administered 2014-09-17: 2 via ORAL
  Filled 2014-09-17: qty 2

## 2014-09-17 NOTE — ED Notes (Signed)
Patient stomped out of the room and writer advised that she would probably need a prescription for her UTI and stated, "I'm getting the hell out of here. I will be back tonight and I will have my old man come up here and tell y'all something." Patient left AMA.

## 2014-09-17 NOTE — ED Provider Notes (Signed)
CSN: [id number]     Arrival date & time 09/17/14  1118 History   First MD Initiated Contact with Patient 09/17/14 1146     Chief Complaint  Patient presents with  . Hematuria  . Flank Pain     (Consider location/radiation/quality/duration/timing/severity/associated sxs/prior Treatment) HPI Comments: The patient is a 44 year old female who presents with recurrent back pain and vaginal burning. She states that she has had this for some time, several days, seems to be getting worse, she was seen and evaluated for this in the emergency department for the same complaint several days ago her and which time she had a CT scan, results are below. Also had a urinalysis that showed many bacteria, she was treated with antibiotics as well as being treated with medicines for pelvic inflammatory disease which was clinically appropriate given her exam. Review of the medical record shows that her urinalysis grew Escherichia coli over 100,000 colonies, it also was negative for gonorrhea or chlamydia. She has been partially compliant with taking the Keflex and doxycycline that she was prescribed. She has no fevers but has had nausea, her pain in her back seems to get worse when she moves around, she denies dysuria.  IMPRESSION: 1. No acute abnormalities involving the abdomen or pelvis. 2. Very small (approximate 2 mm) nonobstructing calculus in a mid calix of the right kidney. No obstructing right ureteral calculus. No left urinary tract calculi.    Patient is a 43 y.o. female presenting with hematuria and flank pain. The history is provided by the patient.  Hematuria  Flank Pain    Past Medical History  Diagnosis Date  . DJD (degenerative joint disease)   . Scoliosis   . Kidney stones   . Anxiety   . Cancer     ovarian  . Ovarian cancer dx'd 2005  . Substance abuse     Vicodin  . Bipolar 1 disorder   . Depression   . Fibromyalgia    Past Surgical History  Procedure Laterality Date  .  Abdominal hysterectomy    . Tubal ligation     Family History  Problem Relation Age of Onset  . Heart failure Mother   . Heart failure Father    History  Substance Use Topics  . Smoking status: Current Every Day Smoker -- 1.00 packs/day    Types: Cigarettes  . Smokeless tobacco: Never Used  . Alcohol Use: No   OB History    Gravida Para Term Preterm AB TAB SAB Ectopic Multiple Living   5 5 5  0 0 0 0 0 0 5     Review of Systems  Genitourinary: Positive for hematuria and flank pain.  All other systems reviewed and are negative.     Allergies  Morphine and related; Penicillins; Toradol; and Zofran  Home Medications   Prior to Admission medications   Medication Sig Start Date End Date Taking? Authorizing Provider  acetaminophen (TYLENOL) 500 MG tablet Take 1,000 mg by mouth every 6 (six) hours as needed.    Historical Provider, MD  albuterol (PROVENTIL HFA;VENTOLIN HFA) 108 (90 BASE) MCG/ACT inhaler Inhale 1-2 puffs into the lungs every 6 (six) hours as needed for wheezing or shortness of breath.     Historical Provider, MD  cephALEXin (KEFLEX) 500 MG capsule Take 1 capsule (500 mg total) by mouth 4 (four) times daily. 09/15/14   Hannah Muthersbaugh, PA-C  doxycycline (VIBRAMYCIN) 100 MG capsule Take 1 capsule (100 mg total) by mouth 2 (two) times daily. 09/15/14  Hannah Muthersbaugh, PA-C  ibuprofen (ADVIL,MOTRIN) 200 MG tablet Take 400 mg by mouth every 6 (six) hours as needed (for pain.).    Historical Provider, MD   BP 148/89 mmHg  Pulse 114  Resp 18  SpO2 100%  LMP 03/10/2005 Physical Exam  Constitutional: She appears well-developed and well-nourished. No distress.  HENT:  Head: Normocephalic and atraumatic.  Mouth/Throat: Oropharynx is clear and moist. No oropharyngeal exudate.  Eyes: Conjunctivae and EOM are normal. Pupils are equal, round, and reactive to light. Right eye exhibits no discharge. Left eye exhibits no discharge. No scleral icterus.  Neck: Normal range  of motion. Neck supple. No JVD present. No thyromegaly present.  Cardiovascular: Normal rate, regular rhythm, normal heart sounds and intact distal pulses.  Exam reveals no gallop and no friction rub.   No murmur heard. Pulmonary/Chest: Effort normal and breath sounds normal. No respiratory distress. She has no wheezes. She has no rales.  Abdominal: Soft. Bowel sounds are normal. She exhibits no distension and no mass. There is no tenderness.  Genitourinary:  Chaperone present for inspection, perineum appears clear of any rashes, no redness, no tenderness, no swelling, no discharge, no foul smell  Musculoskeletal: Normal range of motion. She exhibits tenderness (tenderness is present across the mid and right lower back, worse with range of motion, worse with light palpation, no rash seen). She exhibits no edema.  Lymphadenopathy:    She has no cervical adenopathy.  Neurological: She is alert. Coordination normal.  Skin: Skin is warm and dry. No rash noted. No erythema.  Psychiatric: She has a normal mood and affect. Her behavior is normal.  Nursing note and vitals reviewed.   ED Course  Procedures (including critical care time) Labs Review Labs Reviewed  URINALYSIS, ROUTINE W REFLEX MICROSCOPIC (NOT AT [facility])    Imaging Review No results found.   MDM   Final diagnoses:  None    The etiology of the patient's symptoms could be related to ongoing urinary infection if she has not been totally compliant or needs a different medication. The urine culture and sensitivity report shows that Keflex should be adequate however we will recheck urine within and out catheterization at the patient's request, pain medications have been ordered.  The patient was given pain medication and offered an aunt about a However she reportedly became very upset when she found out she was not getting IV Dilaudid or opiate medications, she stormed out of the room yelling at the nurse, she was yelling  obscenities, calling the nurse names, refused to take the Bactrim, left without her current prescriptions after being told that she needed medications and should wait for a prescription she again cursed and stormed out of the emergency department.   I was not informed of the patient's exit at the time that she left.  Noemi Chapel, MD 09/17/14 [phone]

## 2014-09-17 NOTE — ED Notes (Signed)
Patient yelling and screaming at nurse asking if she has "coli in her urine". "Everybody thinks I am drug seeking". Patient states "I want a doctor in here." "I don't have time to sit up here for hours. I was told all of those antibiotics were going to be a miracle  For me. I should have stayed in [place]. They make sure everything is OK .

## 2014-09-17 NOTE — ED Notes (Signed)
Pt asked to provide urine specimen pt states that she can not void and we would have to preform a cath on her

## 2014-09-17 NOTE — ED Notes (Signed)
Pt c/o flank pain and perineal burning, pt tearful, sobbing, pt seen for the same in the past and Dxed with acute PIC and a UTI, pt taking keflex and doxycyline.

## 2014-09-17 NOTE — ED Notes (Signed)
Patient notified by phone  [phone] that she had left her antivbiotics lying on the table. Patient stated, "I don't want them." and hung up.

## 2014-09-17 NOTE — ED Notes (Signed)
While taking pts vital signs pt asked about pain meds. I informed her that I would make the nurse aware of her needs. Pt the started yelling and cursing at me telling me that the RN already knew and was supposed to find the DR. Informed pt that the DR was in another room with another pt and had been for sometime preforming a procedure. Pt then demanded another DR. I informed pt that the DR would be finished with procedure shortly and would be made aware of her needs, Pt continued to yell and curse.

## 2014-09-17 NOTE — ED Notes (Signed)
Patient states she was seen for PID and UTI. Patient states she only took 2 1/2 days worth of antibiotics and now c/o pain and burning worse. Patient very tearful.

## 2014-09-17 NOTE — ED Notes (Signed)
EDP notified that the patient had left AMA

## 2014-09-19 ENCOUNTER — Telehealth: Payer: Self-pay | Admitting: *Deleted

## 2014-09-19 NOTE — ED Notes (Signed)
(+)  urine culture, treated with Doxycycline and cephalexin, OK per Cherlynn Polo, Pharm

## 2014-09-28 ENCOUNTER — Emergency Department (HOSPITAL_COMMUNITY)
Admission: EM | Admit: 2014-09-28 | Discharge: 2014-09-28 | Disposition: A | Discharge status: Home / Self Care | Payer: PRIVATE HEALTH INSURANCE | Attending: Emergency Medicine | Admitting: Emergency Medicine

## 2014-09-28 ENCOUNTER — Emergency Department (HOSPITAL_COMMUNITY): Payer: PRIVATE HEALTH INSURANCE

## 2014-09-28 ENCOUNTER — Encounter (HOSPITAL_COMMUNITY): Payer: Self-pay | Admitting: Emergency Medicine

## 2014-09-28 DIAGNOSIS — Z720 Tobacco use: Secondary | ICD-10-CM | POA: Insufficient documentation

## 2014-09-28 DIAGNOSIS — Z87442 Personal history of urinary calculi: Secondary | ICD-10-CM | POA: Insufficient documentation

## 2014-09-28 DIAGNOSIS — Z880 Allergy status to penicillin: Secondary | ICD-10-CM | POA: Insufficient documentation

## 2014-09-28 DIAGNOSIS — Z8543 Personal history of malignant neoplasm of ovary: Secondary | ICD-10-CM | POA: Insufficient documentation

## 2014-09-28 DIAGNOSIS — M25552 Pain in left hip: Secondary | ICD-10-CM | POA: Insufficient documentation

## 2014-09-28 DIAGNOSIS — Z8659 Personal history of other mental and behavioral disorders: Secondary | ICD-10-CM | POA: Insufficient documentation

## 2014-09-28 DIAGNOSIS — Z79899 Other long term (current) drug therapy: Secondary | ICD-10-CM | POA: Insufficient documentation

## 2014-09-28 MED ORDER — HYDROCODONE-ACETAMINOPHEN 5-325 MG PO TABS
2.0000 | ORAL_TABLET | Freq: Once | ORAL | Status: AC
  Administered 2014-09-28: 2 via ORAL
  Filled 2014-09-28: qty 2

## 2014-09-28 NOTE — ED Notes (Signed)
Pt c/o left hip pain 10/10, pt states she just woke up with sharp pain and unable to stand up and walk. Pt denies any fall or injury

## 2014-09-28 NOTE — Discharge Instructions (Signed)
Hip Pain Follow up with your primary care physician.  Take ibuprofen or tylenol as needed for pain.  Your hip is the joint between your upper legs and your lower pelvis. The bones, cartilage, tendons, and muscles of your hip joint perform a lot of work each day supporting your body weight and allowing you to move around. Hip pain can range from a minor ache to severe pain in one or both of your hips. Pain may be felt on the inside of the hip joint near the groin, or the outside near the buttocks and upper thigh. You may have swelling or stiffness as well.  HOME CARE INSTRUCTIONS   Take medicines only as directed by your health care provider.  Apply ice to the injured area:  Put ice in a plastic bag.  Place a towel between your skin and the bag.  Leave the ice on for 15-20 minutes at a time, 3-4 times a day.  Keep your leg raised (elevated) when possible to lessen swelling.  Avoid activities that cause pain.  Follow specific exercises as directed by your health care provider.  Sleep with a pillow between your legs on your most comfortable side.  Record how often you have hip pain, the location of the pain, and what it feels like. SEEK MEDICAL CARE IF:   You are unable to put weight on your leg.  Your hip is red or swollen or very tender to touch.  Your pain or swelling continues or worsens after 1 week.  You have increasing difficulty walking.  You have a fever. SEEK IMMEDIATE MEDICAL CARE IF:   You have fallen.  You have a sudden increase in pain and swelling in your hip. MAKE SURE YOU:   Understand these instructions.  Will watch your condition.  Will get help right away if you are not doing well or get worse. Document Released: 08/14/2009 Document Revised: 07/11/2013 Document Reviewed: 10/21/2012 [facility] Patient Information 2015 [place], [place]. This information is not intended to replace advice given to you by your health care provider. Make sure you discuss any  questions you have with your health care provider.

## 2014-09-28 NOTE — Progress Notes (Signed)
LCSW met with patient's family member [name] with regards to patient's behaviors and drug seeking behavior. Patient has been 3 times this month and has a history of pain seeking behavior with her chart flagged. [name] reports patient is a [facility] and LCSW confirmed with facility. Patient also abusing Xanax and THC.  Patient is very sleepy and lethargic.  She is aware to tell LCSW who [name] is, her relation and how she is the most dependable person she knows.  Patient is able to request a soda and crackers with peanut butter.    List of SA resources has been given and referral made to pain program through [facility] as patient has current and hx of pain seeking behavior.   Patient does not have a PCP per report she does not have medicaid and no insurance.  Information given about the [facility] and PCP.    Patient leaving with her niece [name].  Letter given to brandy for missed work due to her needing to be here for ED Care plan as well as transportation.  LCSW updated FYI for ED Care Plan and Controlled Substances.  [name], MSW Clinical Social Work: Emergency Room [phone]

## 2014-09-28 NOTE — ED Notes (Signed)
PT ambulated 30+ feet in hallway with minimal assistance. PT complains of left hip and leg pain during ambulation. Pt has unsteady hobbling gait. PT returned to bed. Monitored by pulse ox and bp cuff.

## 2014-09-28 NOTE — ED Notes (Signed)
Spoke with pt family regarding picking up pt from ED. Brandy on the way to pick up patient.Pt lethargic but arrousable with minor stimulation. Waiting for pt family to D/C pt safely home.

## 2014-09-28 NOTE — ED Notes (Signed)
Pt. Remains lethargic but arousable and coherant at d/c. Wheeled to family vehicle by this RN and NT. Pt. Ambulatory with assistance. Pt. Family comfortable with d/c and understanding of d/c instructions.

## 2014-09-28 NOTE — ED Provider Notes (Signed)
CSN: [id number]     Arrival date & time 09/28/14  6256 History   First MD Initiated Contact with Patient 09/28/14 0615     Chief Complaint  Patient presents with  . Hip Pain     (Consider location/radiation/quality/duration/timing/severity/associated sxs/prior Treatment) Patient is a 44 y.o. female presenting with hip pain. The history is provided by the patient. No language interpreter was used.  Hip Pain Associated symptoms include myalgias. Pertinent negatives include no fever, neck pain, numbness or weakness.  Courtney [name] is a 44 y.o female with a history of DJD, scoliosis, ovarian cancer, and substance abuse who presents for left hip pain that she has had for the past month but worse this morning. She states she went to take a step out of bed this morning and fell to the ground in pain. She states that she cant walk and had to have her husband help her to get dressed. She is also complaining of a 30 pound weight loss in the past month. She states her pain is 10/10.  She denies taking anything for pain. She denies any fever, chills, chest pain, abdominal pain, nausea, vomiting, diarrhea, urinary frequency, hematuria, dysuria, vaginal bleeding or vaginal discharge. S/P hysterectomy. She smokes but denies any drug or alcohol use.    Past Medical History  Diagnosis Date  . DJD (degenerative joint disease)   . Scoliosis   . Kidney stones   . Anxiety   . Cancer     ovarian  . Ovarian cancer dx'd 2005  . Substance abuse     Vicodin  . Bipolar 1 disorder   . Depression   . Fibromyalgia    Past Surgical History  Procedure Laterality Date  . Abdominal hysterectomy    . Tubal ligation     Family History  Problem Relation Age of Onset  . Heart failure Mother   . Heart failure Father    History  Substance Use Topics  . Smoking status: Current Every Day Smoker -- 1.00 packs/day    Types: Cigarettes  . Smokeless tobacco: Never Used  . Alcohol Use: No   OB History    Gravida Para  Term Preterm AB TAB SAB Ectopic Multiple Living   5 5 5  0 0 0 0 0 0 5     Review of Systems  Constitutional: Negative for fever.  Musculoskeletal: Positive for myalgias. Negative for back pain and neck pain.  Neurological: Negative for weakness and numbness.  All other systems reviewed and are negative.     Allergies  Morphine and related; Penicillins; Toradol; and Zofran  Home Medications   Prior to Admission medications   Medication Sig Start Date End Date Taking? Authorizing Provider  PRESCRIPTION MEDICATION Take 1 tablet by mouth daily. Hormones   Yes Historical Provider, MD  cephALEXin (KEFLEX) 500 MG capsule Take 1 capsule (500 mg total) by mouth 4 (four) times daily. Patient not taking: Reported on 09/28/2014 09/15/14   Jarrett Soho Muthersbaugh, PA-C  doxycycline (VIBRAMYCIN) 100 MG capsule Take 1 capsule (100 mg total) by mouth 2 (two) times daily. Patient not taking: Reported on 09/28/2014 09/15/14   Jarrett Soho Muthersbaugh, PA-C   BP 97/67 mmHg  Pulse 73  Temp(Src) 99.9 F (37.7 C) (Oral)  Resp 18  SpO2 98%  LMP 03/10/2005 Physical Exam  Constitutional: She is oriented to person, place, and time. Vital signs are normal. She appears well-developed and well-nourished.  Tearful.  HENT:  Head: Normocephalic and atraumatic.  Eyes: Conjunctivae are normal.  Neck:  Normal range of motion. Neck supple.  Cardiovascular: Normal rate, regular rhythm and normal heart sounds.   Pulmonary/Chest: Effort normal and breath sounds normal. No respiratory distress. She has no wheezes. She has no rales.  Abdominal: Soft. There is no tenderness.  Musculoskeletal: Normal range of motion.  No left leg or hip swelling.  No leg shortening or rotation of the foot. Good DP pulse and capillary refill. Hips are stable. No ecchymosis, erythema, or abrasion to the hips.  Unable to flex and extend the hip secondary to pain.  She is able to flex and extend the knee without difficulty.  Good plantar and dorsi  flexion of the foot. NVI.   Neurological: She is alert and oriented to person, place, and time.  Skin: Skin is warm and dry.  Nursing note and vitals reviewed.   ED Course  Procedures (including critical care time) Labs Review Labs Reviewed - No data to display  Imaging Review Dg Hip Unilat With Pelvis 2-3 Views Left  09/28/2014   CLINICAL DATA:  Left hip pain, no known injury, initial encounter  EXAM: DG HIP (WITH OR WITHOUT PELVIS) 2-3V LEFT  COMPARISON:  None.  FINDINGS: The pelvic ring is intact. No acute fracture or dislocation is noted. No definitive joint effusion is seen. No soft tissue abnormality is noted.  IMPRESSION: No acute abnormality seen.   Electronically Signed   By: Inez Catalina M.D.   On: 09/28/2014 07:08     EKG Interpretation None      MDM   Final diagnoses:  Left hip pain   Patient presents for left hip pain for the past month and worse this morning.  Xray of the left hip and pelvis are negative for acute fracture or dislocation.  Medications  HYDROcodone-acetaminophen (NORCO/VICODIN) 5-325 MG per tablet 2 tablet (2 tablets Oral Given 09/28/14 0657)  Recheck: Patient is in deep sleep but arousible.  Patient was able to ambulate in the ED with limping gait. I discussed that I would not give her narcotic pain medication and that she could take ibuprofen or tylenol for pain.  I also explained that she could follow up with her primary care physician regarding her chronic left hip pain that she states she has had for over 1 month.      Ottie Glazier, PA-C 09/28/14 [place], DO 09/28/14 [phone]

## 2014-09-28 NOTE — ED Notes (Signed)
Report at bedside attempted and pt deep asleep after pain medication given.

## 2015-02-18 ENCOUNTER — Emergency Department (HOSPITAL_COMMUNITY): Payer: PRIVATE HEALTH INSURANCE

## 2015-02-18 ENCOUNTER — Emergency Department (HOSPITAL_COMMUNITY): Payer: Self-pay

## 2015-02-18 ENCOUNTER — Emergency Department (HOSPITAL_COMMUNITY)
Admission: EM | Admit: 2015-02-18 | Discharge: 2015-02-18 | Disposition: A | Discharge status: Home / Self Care | Payer: Self-pay | Attending: Emergency Medicine | Admitting: Emergency Medicine

## 2015-02-18 ENCOUNTER — Encounter (HOSPITAL_COMMUNITY): Payer: Self-pay

## 2015-02-18 DIAGNOSIS — Z880 Allergy status to penicillin: Secondary | ICD-10-CM | POA: Insufficient documentation

## 2015-02-18 DIAGNOSIS — M419 Scoliosis, unspecified: Secondary | ICD-10-CM | POA: Insufficient documentation

## 2015-02-18 DIAGNOSIS — M1990 Unspecified osteoarthritis, unspecified site: Secondary | ICD-10-CM | POA: Insufficient documentation

## 2015-02-18 DIAGNOSIS — F17210 Nicotine dependence, cigarettes, uncomplicated: Secondary | ICD-10-CM | POA: Insufficient documentation

## 2015-02-18 DIAGNOSIS — B349 Viral infection, unspecified: Secondary | ICD-10-CM | POA: Insufficient documentation

## 2015-02-18 DIAGNOSIS — Z87442 Personal history of urinary calculi: Secondary | ICD-10-CM | POA: Insufficient documentation

## 2015-02-18 DIAGNOSIS — Z8543 Personal history of malignant neoplasm of ovary: Secondary | ICD-10-CM | POA: Insufficient documentation

## 2015-02-18 DIAGNOSIS — R1031 Right lower quadrant pain: Secondary | ICD-10-CM | POA: Insufficient documentation

## 2015-02-18 DIAGNOSIS — Z90710 Acquired absence of both cervix and uterus: Secondary | ICD-10-CM | POA: Insufficient documentation

## 2015-02-18 DIAGNOSIS — Z8659 Personal history of other mental and behavioral disorders: Secondary | ICD-10-CM | POA: Insufficient documentation

## 2015-02-18 LAB — URINALYSIS, ROUTINE W REFLEX MICROSCOPIC
Bilirubin Urine: NEGATIVE
GLUCOSE, UA: NEGATIVE mg/dL
Hgb urine dipstick: NEGATIVE
Ketones, ur: NEGATIVE mg/dL
LEUKOCYTES UA: NEGATIVE
Nitrite: NEGATIVE
Protein, ur: NEGATIVE mg/dL
Specific Gravity, Urine: 1.016 (ref 1.005–1.030)
pH: 6.5 (ref 5.0–8.0)

## 2015-02-18 LAB — CBC WITH DIFFERENTIAL/PLATELET
Basophils Absolute: 0 10*3/uL (ref 0.0–0.1)
Basophils Relative: 0 %
Eosinophils Absolute: 0.3 10*3/uL (ref 0.0–0.7)
Eosinophils Relative: 3 %
HCT: 43 % (ref 36.0–46.0)
HEMOGLOBIN: 14 g/dL (ref 12.0–15.0)
LYMPHS ABS: 3.2 10*3/uL (ref 0.7–4.0)
LYMPHS PCT: 26 %
MCH: 31 pg (ref 26.0–34.0)
MCHC: 32.6 g/dL (ref 30.0–36.0)
MCV: 95.1 fL (ref 78.0–100.0)
Monocytes Absolute: 0.8 10*3/uL (ref 0.1–1.0)
Monocytes Relative: 7 %
NEUTROS PCT: 64 %
Neutro Abs: 8.1 10*3/uL — ABNORMAL HIGH (ref 1.7–7.7)
PLATELETS: 296 10*3/uL (ref 150–400)
RBC: 4.52 MIL/uL (ref 3.87–5.11)
RDW: 12 % (ref 11.5–15.5)
WBC: 12.4 10*3/uL — AB (ref 4.0–10.5)

## 2015-02-18 LAB — LIPASE, BLOOD: Lipase: 22 U/L (ref 11–51)

## 2015-02-18 LAB — COMPREHENSIVE METABOLIC PANEL
ALT: 30 U/L (ref 14–54)
ANION GAP: 8 (ref 5–15)
AST: 31 U/L (ref 15–41)
Albumin: 3.9 g/dL (ref 3.5–5.0)
Alkaline Phosphatase: 66 U/L (ref 38–126)
BUN: 11 mg/dL (ref 6–20)
CHLORIDE: 104 mmol/L (ref 101–111)
CO2: 28 mmol/L (ref 22–32)
Calcium: 9.2 mg/dL (ref 8.9–10.3)
Creatinine, Ser: 0.67 mg/dL (ref 0.44–1.00)
Glucose, Bld: 106 mg/dL — ABNORMAL HIGH (ref 65–99)
POTASSIUM: 4 mmol/L (ref 3.5–5.1)
SODIUM: 140 mmol/L (ref 135–145)
Total Bilirubin: 0.5 mg/dL (ref 0.3–1.2)
Total Protein: 7.3 g/dL (ref 6.5–8.1)

## 2015-02-18 LAB — TROPONIN I

## 2015-02-18 MED ORDER — HYDROMORPHONE HCL 1 MG/ML IJ SOLN
0.5000 mg | Freq: Once | INTRAMUSCULAR | Status: AC
  Administered 2015-02-18: 0.5 mg via INTRAVENOUS
  Filled 2015-02-18: qty 1

## 2015-02-18 MED ORDER — IOHEXOL 300 MG/ML  SOLN
100.0000 mL | Freq: Once | INTRAMUSCULAR | Status: AC | PRN
  Administered 2015-02-18: 100 mL via INTRAVENOUS

## 2015-02-18 MED ORDER — IOHEXOL 300 MG/ML  SOLN
25.0000 mL | Freq: Once | INTRAMUSCULAR | Status: AC | PRN
  Administered 2015-02-18: 25 mL via ORAL

## 2015-02-18 MED ORDER — PROMETHAZINE HCL 25 MG/ML IJ SOLN
25.0000 mg | Freq: Once | INTRAMUSCULAR | Status: AC
  Administered 2015-02-18: 25 mg via INTRAVENOUS
  Filled 2015-02-18: qty 1

## 2015-02-18 MED ORDER — PSEUDOEPHEDRINE HCL 60 MG PO TABS
60.0000 mg | ORAL_TABLET | Freq: Once | ORAL | Status: DC
  Filled 2015-02-18: qty 1

## 2015-02-18 MED ORDER — SODIUM CHLORIDE 0.9 % IV BOLUS (SEPSIS)
1000.0000 mL | Freq: Once | INTRAVENOUS | Status: AC
  Administered 2015-02-18: 1000 mL via INTRAVENOUS

## 2015-02-18 NOTE — ED Notes (Signed)
Pt requesting pain medication.  MD notified.

## 2015-02-18 NOTE — ED Notes (Signed)
Awake. Verbally responsive. A/O x4. Resp even and unlabored. No audible adventitious breath sounds noted. ABC's intact.  

## 2015-02-18 NOTE — ED Provider Notes (Signed)
CSN: [id number]     Arrival date & time 02/18/15  1507 History   First MD Initiated Contact with Patient 02/18/15 1648     Chief Complaint  Patient presents with  . Facial Pain     (Consider location/radiation/quality/duration/timing/severity/associated sxs/prior Treatment) HPI Comments: 44 year old female with past medical history including ovarian cancer status post hysterectomy, bipolar disorder, fibromyalgia, kidney stones, substance abuse who presents with sinus pressure, cough, vomiting, and abdominal pain. Patient states that 3 days ago she began having sinus pressure, cough, and nasal congestion as well as subjective fevers for which she has been taking Tylenol. Yesterday she began having hoarseness and vomiting associated with right lower quadrant abdominal pain. The pain has been constant and severe. She denies any associated urinary symptoms, diarrhea, or blood in her stool. She endorses some chest tightness and her cough is productive of yellow phlegm. She denies any sick contacts.  The history is provided by the patient.    Past Medical History  Diagnosis Date  . DJD (degenerative joint disease)   . Scoliosis   . Kidney stones   . Anxiety   . Cancer ([place])     ovarian  . Ovarian cancer ([place]) dx'd 2005  . Substance abuse     Vicodin  . Bipolar 1 disorder ([place])   . Depression   . Fibromyalgia    Past Surgical History  Procedure Laterality Date  . Abdominal hysterectomy    . Tubal ligation     Family History  Problem Relation Age of Onset  . Heart failure Mother   . Heart failure Father    Social History  Substance Use Topics  . Smoking status: Current Every Day Smoker -- 1.00 packs/day    Types: Cigarettes  . Smokeless tobacco: Never Used  . Alcohol Use: No   OB History    Gravida Para Term Preterm AB TAB SAB Ectopic Multiple Living   5 5 5  0 0 0 0 0 0 5     Review of Systems  10 Systems reviewed and are negative for acute change except as noted in the  HPI.   Allergies  Morphine and related; Penicillins; Sudafed; Toradol; and Zofran  Home Medications   Prior to Admission medications   Medication Sig Start Date End Date Taking? Authorizing Provider  acetaminophen (TYLENOL) 500 MG tablet Take 2,000 mg by mouth every 6 (six) hours as needed for mild pain or fever.   Yes Historical Provider, MD  Misc Natural Products (ESTROVEN ENERGY PO) Take 1 tablet by mouth daily.   Yes Historical Provider, MD  cephALEXin (KEFLEX) 500 MG capsule Take 1 capsule (500 mg total) by mouth 4 (four) times daily. Patient not taking: Reported on 09/28/2014 09/15/14   Jarrett Soho Muthersbaugh, PA-C  doxycycline (VIBRAMYCIN) 100 MG capsule Take 1 capsule (100 mg total) by mouth 2 (two) times daily. Patient not taking: Reported on 09/28/2014 09/15/14   Jarrett Soho Muthersbaugh, PA-C   BP 117/87 mmHg  Pulse 93  Temp(Src) 98 F (36.7 C) (Oral)  Resp 18  SpO2 96%  LMP 03/10/2005 Physical Exam  Constitutional: She is oriented to person, place, and time. She appears well-developed and well-nourished. No distress.  uncomfortable  HENT:  Head: Normocephalic and atraumatic.  Mouth/Throat: No oropharyngeal exudate.  + nasal congestion, Moist mucous membranes  Eyes: Conjunctivae are normal. Pupils are equal, round, and reactive to light.  Neck: Neck supple.  Cardiovascular: Normal rate, regular rhythm and normal heart sounds.   No murmur heard. Pulmonary/Chest: Effort  normal and breath sounds normal. No respiratory distress. She has no wheezes.  Abdominal: Soft. Bowel sounds are normal. She exhibits no distension.  RLQ TTP no rebound or guarding  Musculoskeletal: She exhibits no edema.  Neurological: She is alert and oriented to person, place, and time.  Fluent speech  Skin: Skin is warm and dry.  Psychiatric: Judgment normal.  Mildly distressed and tearful during conversation  Nursing note and vitals reviewed.   ED Course  Procedures (including critical care time) Labs  Review Labs Reviewed  COMPREHENSIVE METABOLIC PANEL - Abnormal; Notable for the following:    Glucose, Bld 106 (*)    All other components within normal limits  CBC WITH DIFFERENTIAL/PLATELET - Abnormal; Notable for the following:    WBC 12.4 (*)    Neutro Abs 8.1 (*)    All other components within normal limits  LIPASE, BLOOD  URINALYSIS, ROUTINE W REFLEX MICROSCOPIC (NOT AT [facility])  TROPONIN I    Imaging Review Dg Chest 2 View  02/18/2015  CLINICAL DATA:  Sinus pressure, congestion, and cough for 3 days. Chest tightness. Smoker. EXAM: CHEST  2 VIEW COMPARISON:  12/27/2012 FINDINGS: Increased central interstitial markings in the chest consistent with chronic bronchitic changes. No focal airspace disease or consolidation. No blunting of costophrenic angles. No pneumothorax. Mediastinal contours appear intact. Normal heart size and pulmonary vascularity. IMPRESSION: Chronic bronchitic changes in the chest. No evidence of active pulmonary disease. Electronically Signed   By: Lucienne Capers M.D.   On: 02/18/2015 18:09   Ct Abdomen Pelvis W Contrast  02/18/2015  CLINICAL DATA:  RIGHT lower quadrant pain. History of kidney stones, ovarian cancer. EXAM: CT ABDOMEN AND PELVIS WITH CONTRAST TECHNIQUE: Multidetector CT imaging of the abdomen and pelvis was performed using the standard protocol following bolus administration of intravenous contrast. CONTRAST:  59mL OMNIPAQUE IOHEXOL 300 MG/ML SOLN, 159mL OMNIPAQUE IOHEXOL 300 MG/ML SOLN COMPARISON:  CT abdomen and pelvis September 14, 2014 FINDINGS: LUNG BASES: Mild dependent atelectasis. Included heart size is normal. No pericardial effusions. Mildly thickened distal esophagus. SOLID ORGANS: The liver, spleen, gallbladder, pancreas and adrenal glands are unremarkable. GASTROINTESTINAL TRACT: The stomach, small and large bowel are normal in course and caliber without inflammatory changes. Moderate amount of retained large bowel stool. Redundant sigmoid colon.  Normal appendix. KIDNEYS/ URINARY TRACT: Kidneys are orthotopic, demonstrating symmetric enhancement. 3 mm RIGHT interpolar nephrolithiasis. No hydronephrosis or solid renal masses. The unopacified ureters are normal in course and caliber. Delayed imaging through the kidneys demonstrates symmetric prompt contrast excretion within the proximal urinary collecting system. Urinary bladder is well distended and unremarkable. PERITONEUM/RETROPERITONEUM: Aortoiliac vessels are normal in course and caliber. No lymphadenopathy by CT size criteria. Status post hysterectomy in probable oophorectomy. No intraperitoneal free fluid nor free air. SOFT TISSUE/OSSEOUS STRUCTURES: Non-suspicious. L1-2 segmentation anomaly. Tiny fat containing umbilical hernia. IMPRESSION: 3 mm nonobstructing RIGHT interpolar nephrolithiasis. Moderate amount of retained large bowel stool without bowel obstruction. Normal appendix. Mildly thickened distal esophagus may represent redundancy or, mild esophagitis. Electronically Signed   By: Elon Alas M.D.   On: 02/18/2015 22:01   I have personally reviewed and evaluated these lab results as part of my medical decision-making.   EKG Interpretation   Date/Time:  Sunday February 18 2015 18:26:39 EST Ventricular Rate:  67 PR Interval:  203 QRS Duration: 98 QT Interval:  425 QTC Calculation: 449 R Axis:   72 Text Interpretation:  Sinus rhythm Borderline prolonged PR interval  Consider left atrial enlargement No significant change  since last tracing  Confirmed by Keelan Pomerleau MD, Sam Wunschel [phone]) on 02/18/2015 8:49:52 PM     Medications  sodium chloride 0.9 % bolus 1,000 mL (0 mLs Intravenous Stopped 02/18/15 2302)  promethazine (PHENERGAN) injection 25 mg (25 mg Intravenous Given 02/18/15 1820)  HYDROmorphone (DILAUDID) injection 0.5 mg (0.5 mg Intravenous Given 02/18/15 1820)  iohexol (OMNIPAQUE) 300 MG/ML solution 25 mL (25 mLs Oral Contrast Given 02/18/15 2100)  iohexol (OMNIPAQUE)  300 MG/ML solution 100 mL (100 mLs Intravenous Contrast Given 02/18/15 2142)  HYDROmorphone (DILAUDID) injection 0.5 mg (0.5 mg Intravenous Given 02/18/15 2056)    MDM   Final diagnoses:  Viral syndrome   Patient presenting with several days of cough, nasal congestion, and subjective fevers as well as vomiting and right lower quadrant abdominal pain that began yesterday morning and it persisted throughout today. Patient uncomfortable and tearful during exam but in no acute distress. She was mildly tachycardic but the rest of her vital signs were unremarkable. Right lower quadrant tenderness noted without any rebound or guarding. Obtained above lab work as well as chest x-ray. Gave the patient an IV fluid bolus, Phenergan, and Dilaudid for pain. Because of the patient's right lower quadrant tenderness, ddx includes appendicitis or kidney stones given patient's history. Obtained CT abd/pelvis which was negative for acute process. Chest x-ray unremarkable and lab work reassuring with no evidence of infection. Troponin and EKG unremarkable. I suspect symptoms are due to a viral process.Of note, the patient's boyfriend spoke with the patient's primary nurse in private. He stated that the patient has a history of methadone use, manipulation, and drug seeking behavior. On reexamination, the patient remained with stable vital signs. Discussed supportive care including continued hydration. Return precautions reviewed. After I left patient's room to print paperwork including Rx for phenergan, patient left prior to discharge.      Sharlett Iles, MD 02/18/15 [phone]

## 2015-02-18 NOTE — ED Notes (Signed)
She c/o sinus pain/pressure; plus congested cough; plus rlq area abd. Pain.  She is in no distress.

## 2015-02-18 NOTE — ED Notes (Signed)
Pt requesting more pain meds. MD notified

## 2015-02-18 NOTE — ED Notes (Signed)
Pt returned from CT °

## 2015-11-08 ENCOUNTER — Encounter (HOSPITAL_COMMUNITY): Payer: Self-pay | Admitting: Emergency Medicine

## 2015-11-08 ENCOUNTER — Emergency Department (HOSPITAL_COMMUNITY)
Admission: EM | Admit: 2015-11-08 | Discharge: 2015-11-08 | Disposition: A | Discharge status: Home / Self Care | Payer: PRIVATE HEALTH INSURANCE | Attending: Emergency Medicine | Admitting: Emergency Medicine

## 2015-11-08 ENCOUNTER — Emergency Department (HOSPITAL_COMMUNITY): Payer: PRIVATE HEALTH INSURANCE

## 2015-11-08 DIAGNOSIS — Z8543 Personal history of malignant neoplasm of ovary: Secondary | ICD-10-CM | POA: Insufficient documentation

## 2015-11-08 DIAGNOSIS — F17210 Nicotine dependence, cigarettes, uncomplicated: Secondary | ICD-10-CM | POA: Insufficient documentation

## 2015-11-08 DIAGNOSIS — N390 Urinary tract infection, site not specified: Secondary | ICD-10-CM | POA: Insufficient documentation

## 2015-11-08 DIAGNOSIS — Z79899 Other long term (current) drug therapy: Secondary | ICD-10-CM | POA: Insufficient documentation

## 2015-11-08 LAB — URINE MICROSCOPIC-ADD ON
RBC / HPF: NONE SEEN RBC/hpf (ref 0–5)
RBC / HPF: NONE SEEN RBC/hpf (ref 0–5)

## 2015-11-08 LAB — URINALYSIS, ROUTINE W REFLEX MICROSCOPIC
BILIRUBIN URINE: NEGATIVE
Bilirubin Urine: NEGATIVE
GLUCOSE, UA: NEGATIVE mg/dL
Glucose, UA: NEGATIVE mg/dL
HGB URINE DIPSTICK: NEGATIVE
Hgb urine dipstick: NEGATIVE
KETONES UR: NEGATIVE mg/dL
Ketones, ur: NEGATIVE mg/dL
LEUKOCYTES UA: NEGATIVE
NITRITE: POSITIVE — AB
Nitrite: POSITIVE — AB
PH: 7.5 (ref 5.0–8.0)
PROTEIN: NEGATIVE mg/dL
Protein, ur: NEGATIVE mg/dL
Specific Gravity, Urine: 1.017 (ref 1.005–1.030)
Specific Gravity, Urine: 1.012 (ref 1.005–1.030)
pH: 7.5 (ref 5.0–8.0)

## 2015-11-08 LAB — CBC
HCT: 41.2 % (ref 36.0–46.0)
HEMOGLOBIN: 14.4 g/dL (ref 12.0–15.0)
MCH: 31 pg (ref 26.0–34.0)
MCHC: 35 g/dL (ref 30.0–36.0)
MCV: 88.6 fL (ref 78.0–100.0)
PLATELETS: 286 10*3/uL (ref 150–400)
RBC: 4.65 MIL/uL (ref 3.87–5.11)
RDW: 11.6 % (ref 11.5–15.5)
WBC: 9.6 10*3/uL (ref 4.0–10.5)

## 2015-11-08 LAB — BASIC METABOLIC PANEL
ANION GAP: 6 (ref 5–15)
BUN: 13 mg/dL (ref 6–20)
CALCIUM: 9.3 mg/dL (ref 8.9–10.3)
CO2: 27 mmol/L (ref 22–32)
CREATININE: 0.69 mg/dL (ref 0.44–1.00)
Chloride: 110 mmol/L (ref 101–111)
GLUCOSE: 98 mg/dL (ref 65–99)
Potassium: 3.6 mmol/L (ref 3.5–5.1)
Sodium: 143 mmol/L (ref 135–145)

## 2015-11-08 LAB — PREGNANCY, URINE: PREG TEST UR: NEGATIVE

## 2015-11-08 MED ORDER — CIPROFLOXACIN IN D5W 400 MG/200ML IV SOLN
400.0000 mg | Freq: Once | INTRAVENOUS | Status: AC
  Administered 2015-11-08: 400 mg via INTRAVENOUS
  Filled 2015-11-08: qty 200

## 2015-11-08 MED ORDER — HYDROMORPHONE HCL 1 MG/ML IJ SOLN
1.0000 mg | Freq: Once | INTRAMUSCULAR | Status: AC
Start: 2015-11-08 — End: 2015-11-08
  Administered 2015-11-08: 1 mg via INTRAVENOUS
  Filled 2015-11-08: qty 1

## 2015-11-08 MED ORDER — SODIUM CHLORIDE 0.9 % IV BOLUS (SEPSIS)
1000.0000 mL | Freq: Once | INTRAVENOUS | Status: AC
  Administered 2015-11-08: 1000 mL via INTRAVENOUS

## 2015-11-08 MED ORDER — HYDROMORPHONE HCL 1 MG/ML IJ SOLN
1.0000 mg | Freq: Once | INTRAMUSCULAR | Status: AC
  Administered 2015-11-08: 1 mg via INTRAVENOUS
  Filled 2015-11-08: qty 1

## 2015-11-08 MED ORDER — CIPROFLOXACIN HCL 500 MG PO TABS: 500.0000 mg | ORAL_TABLET | Freq: Two times a day (BID) | ORAL | 0 refills | Status: AC

## 2015-11-08 MED ORDER — ONDANSETRON HCL 4 MG/2ML IJ SOLN
4.0000 mg | Freq: Once | INTRAMUSCULAR | Status: AC
Start: 2015-11-08 — End: 2015-11-08
  Administered 2015-11-08: 4 mg via INTRAVENOUS
  Filled 2015-11-08: qty 2

## 2015-11-08 MED ORDER — OXYCODONE-ACETAMINOPHEN 5-325 MG PO TABS: 2.0000 | ORAL_TABLET | Freq: Three times a day (TID) | ORAL | 0 refills | Status: AC | PRN

## 2015-11-08 NOTE — ED Triage Notes (Signed)
Patient reports right sided flank pain since this morning. Reports urgency with urination and blood in urine x2 days. Patient also reports N/V starting today. Pt with hx of kidney stones and states that this feels similar. Reports taking 2 ibuprofen at 3pm with no relief.

## 2015-11-08 NOTE — ED Provider Notes (Signed)
[facility] DEPT Provider Note   CSN: [id number] Arrival date & time: 11/08/15  1631     History   Chief Complaint Chief Complaint  Patient presents with  . Flank Pain    HPI Courtney [name] is a 45 y.o. female.  HPI  45 y.o. female with a hx of Kidney Stones, presents to the Emergency Department today complaining of right flank pain x 3 days. Associated N/V, hematuria and odor to urine. Notes hx same with previous stones. No dysuria. No CP/SOB. Notes fever at home with chills. Rates pain 8/10 that waxes and wanes. OTC with minimal relief. No other symptoms noted.   Past Medical History:  Diagnosis Date  . Anxiety   . Bipolar 1 disorder ([place])   . Cancer ([place])    ovarian  . Depression   . DJD (degenerative joint disease)   . Fibromyalgia   . Kidney stones   . Ovarian cancer ([place]) dx'd 2005  . Scoliosis   . Substance abuse    Vicodin    Patient Active Problem List   Diagnosis Date Noted  . Suicidal ideation 05/24/2013  . Fibromyalgia 12/03/2012  . Anxiety state, unspecified 11/01/2012  . Hot flushes, perimenopausal 11/01/2012    Past Surgical History:  Procedure Laterality Date  . ABDOMINAL HYSTERECTOMY    . TUBAL LIGATION      OB History    Gravida Para Term Preterm AB Living   5 5 5  0 0 5   SAB TAB Ectopic Multiple Live Births   0 0 0 0         Home Medications    Prior to Admission medications   Medication Sig Start Date End Date Taking? Authorizing Provider  acetaminophen (TYLENOL) 500 MG tablet Take 2,000 mg by mouth every 6 (six) hours as needed for mild pain or fever.    Historical Provider, MD  cephALEXin (KEFLEX) 500 MG capsule Take 1 capsule (500 mg total) by mouth 4 (four) times daily. Patient not taking: Reported on 09/28/2014 09/15/14   Jarrett Soho Muthersbaugh, PA-C  doxycycline (VIBRAMYCIN) 100 MG capsule Take 1 capsule (100 mg total) by mouth 2 (two) times daily. Patient not taking: Reported on 09/28/2014 09/15/14   Jarrett Soho Muthersbaugh, PA-C    Misc Natural Products (ESTROVEN ENERGY PO) Take 1 tablet by mouth daily.    Historical Provider, MD    Family History Family History  Problem Relation Age of Onset  . Heart failure Mother   . Heart failure Father     Social History Social History  Substance Use Topics  . Smoking status: Current Every Day Smoker    Packs/day: 0.50    Types: Cigarettes  . Smokeless tobacco: Never Used  . Alcohol use No     Allergies   Morphine and related; Penicillins; Sudafed [pseudoephedrine]; Toradol [ketorolac tromethamine]; and Zofran   Review of Systems Review of Systems ROS reviewed and all are negative for acute change except as noted in the HPI.  Physical Exam Updated Vital Signs BP 134/91 (BP Location: Left Arm)   Pulse 85   Temp 99 F (37.2 C) (Oral)   Resp 18   Ht 5\' 4"  (1.626 m)   Wt 67.1 kg   LMP 03/10/2005   SpO2 99%   BMI 25.40 kg/m   Physical Exam  Constitutional: She is oriented to person, place, and time. Vital signs are normal. She appears well-developed and well-nourished.  HENT:  Head: Normocephalic.  Right Ear: Hearing normal.  Left Ear: Hearing  normal.  Eyes: Conjunctivae and EOM are normal. Pupils are equal, round, and reactive to light.  Neck: Normal range of motion. Neck supple.  Cardiovascular: Normal rate, regular rhythm, normal heart sounds and intact distal pulses.   Pulmonary/Chest: Effort normal and breath sounds normal.  Abdominal: Soft. Normal appearance. There is no tenderness. There is CVA tenderness (right). There is no rigidity, no rebound, no guarding, no tenderness at McBurney's point and negative Murphy's sign.  Abdomen Soft  Neurological: She is alert and oriented to person, place, and time.  Skin: Skin is warm and dry.  Psychiatric: She has a normal mood and affect. Her speech is normal and behavior is normal. Thought content normal.   ED Treatments / Results  Labs (all labs ordered are listed, but only abnormal results are  displayed) Labs Reviewed  URINALYSIS, ROUTINE W REFLEX MICROSCOPIC (NOT AT [facility]) - Abnormal; Notable for the following:       Result Value   APPearance CLOUDY (*)    Nitrite POSITIVE (*)    All other components within normal limits  URINE MICROSCOPIC-ADD ON - Abnormal; Notable for the following:    Squamous Epithelial / LPF 6-30 (*)    Bacteria, UA MANY (*)    All other components within normal limits  URINALYSIS, ROUTINE W REFLEX MICROSCOPIC (NOT AT [facility]) - Abnormal; Notable for the following:    Nitrite POSITIVE (*)    Leukocytes, UA TRACE (*)    All other components within normal limits  URINE MICROSCOPIC-ADD ON - Abnormal; Notable for the following:    Squamous Epithelial / LPF 0-5 (*)    Bacteria, UA MANY (*)    All other components within normal limits  URINE CULTURE  BASIC METABOLIC PANEL  CBC  PREGNANCY, URINE    EKG  EKG Interpretation None      Radiology Ct Renal Stone Study  Result Date: 11/08/2015 CLINICAL DATA:  Right flank pain since this morning. EXAM: CT ABDOMEN AND PELVIS WITHOUT CONTRAST TECHNIQUE: Multidetector CT imaging of the abdomen and pelvis was performed following the standard protocol without IV contrast. COMPARISON:  February 18 2015 FINDINGS: Lower chest: No acute findings. There is no focal pneumonia or pleural effusion. Hepatobiliary: No mass visualized on this un-enhanced exam. Pancreas: No mass or inflammatory process identified on this un-enhanced exam. Spleen: Within normal limits in size. Adrenals/Urinary Tract: The adrenal glands are normal. There is a 2 mm nonobstructing stone in the midpole right kidney. No hydronephrosis is identified bilaterally. No focal kidney lesion is noted. The bladder is normal. Stomach/Bowel: No evidence of obstruction, inflammatory process, or abnormal fluid collections. There is no diverticulitis. The appendix is normal. Vascular/Lymphatic: No pathologically enlarged lymph nodes. No evidence of abdominal aortic  aneurysm. Reproductive: Patient is status post prior hysterectomy. No mass or other significant abnormality. Other: None. Musculoskeletal: No acute abnormalities identified. Chronic changes of L1 and L2 are identified. IMPRESSION: Small nonobstructing stone in the right kidney. No hydronephrosis is identified bilaterally. No acute abnormalities identified in the abdomen and pelvis. Electronically Signed   By: Abelardo Diesel M.D.   On: 11/08/2015 19:02    Procedures Procedures (including critical care time)  Medications Ordered in ED Medications  ciprofloxacin (CIPRO) IVPB 400 mg (400 mg Intravenous New Bag/Given 11/08/15 1841)  sodium chloride 0.9 % bolus 1,000 mL (1,000 mLs Intravenous New Bag/Given 11/08/15 1834)  HYDROmorphone (DILAUDID) injection 1 mg (1 mg Intravenous Given 11/08/15 1834)  ondansetron (ZOFRAN) injection 4 mg (4 mg Intravenous Given 11/08/15 1840)  Initial Impression / Assessment and Plan / ED Course  I have reviewed the triage vital signs and the nursing notes.  Pertinent labs & imaging results that were available during my care of the patient were reviewed by me and considered in my medical decision making (see chart for details).  Clinical Course    Final Clinical Impressions(s) / ED Diagnoses  I have reviewed and evaluated the relevant laboratory valuesI have reviewed and evaluated the relevant imaging studies. I have reviewed the relevant previous healthcare records.I obtained HPI from historian. Patient discussed with supervising physician  ED Course:  Assessment: Pt is a 61yF with hx kidney stones who presents with right flank pain x 3 days. Fever at home. On exam, pt in NAD. Nontoxic/nonseptic appearing. VSS. Low grade temp 3F. Lungs CTA. Heart RRR. Abdomen nontender soft. CVA on right side. CBC/BMP with no leukocytosis. Creatinine WNL. UA with positive UTI, but questionable due to numerous squamos epithelial cells. Pt did arrive with low grade temp after taking  Tylenol with reported temps in 100s at home.. Given Ciprofloxacin due to Penicillin allergy. CT Renal with small non obstructing stone on right side. No hydronephrosis. Given fluids and analgesia in ED. Consult to Urology recommended catheterization for UA and Culture. Unconvinced for stone causing symptoms or urinary infection unless clean catch shows infection. Clean urine showed UTI. Will treat with ABX. Urine culture obtained. Plan is to DC home with follow up to PCP. At time of discharge, Patient is in no acute distress. Vital Signs are stable. Patient is able to ambulate. Patient able to tolerate PO.    Disposition/Plan:  DC Home Additional Verbal discharge instructions given and discussed with patient.  Pt Instructed to f/u with PCP in the next week for evaluation and treatment of symptoms. Return precautions given Pt acknowledges and agrees with plan  Supervising Physician Dorie Rank, MD   Final diagnoses:  UTI (lower urinary tract infection)    New Prescriptions New Prescriptions   No medications on file     Shary Decamp, PA-C 11/08/15 2106    Dorie Rank, MD 11/09/15 [phone]

## 2015-11-08 NOTE — Discharge Instructions (Signed)
Please read and follow all provided instructions.  Your diagnoses today include:  1. UTI (lower urinary tract infection)    Tests performed today include: Vital signs. See below for your results today.   Medications prescribed:  Take as prescribed   Home care instructions:  Follow any educational materials contained in this packet.  Follow-up instructions: Please follow-up with your primary care provider for further evaluation of symptoms and treatment   Return instructions:  Please return to the Emergency Department if you do not get better, if you get worse, or new symptoms OR  - Fever (temperature greater than 101.65F)  - Bleeding that does not stop with holding pressure to the area    -Severe pain (please note that you may be more sore the day after your accident)  - Chest Pain  - Difficulty breathing  - Severe nausea or vomiting  - Inability to tolerate food and liquids  - Passing out  - Skin becoming red around your wounds  - Change in mental status (confusion or lethargy)  - New numbness or weakness    Please return if you have any other emergent concerns.  Additional Information:  Your vital signs today were: BP 127/80 (BP Location: Right Arm)    Pulse 73    Temp 99 F (37.2 C) (Oral)    Resp 16    Ht 5\' 4"  (1.626 m)    Wt 67.1 kg    LMP 03/10/2005    SpO2 97%    BMI 25.40 kg/m  If your blood pressure (BP) was elevated above 135/85 this visit, please have this repeated by your doctor within one month. ---------------

## 2015-11-08 NOTE — Treatment Plan (Signed)
Urology contacted regarding 26mm nonobstructing right renal stone. U/a shows nitrituria, + leuk est, many bacteria but many squamous epithelial cells. AFVSS. WBC 9. Cr 0.69.   Recommend sending repeat cathed u/a and separate urine culture.   Otherwise no indication for admission or urologic intervention currently. Could theoretically have cystitis, but this would not qualify as an 'obstructed stone' with infection as tiny 30mm right stone is nonobstructing and no ureteral calculi bilaterally.   Would not recommend flomax. Pain control with NSAIDs, percocet (defer to Er), antiemetics PRN. No need for antibiotics currently but would consider based on repeat cathed urinalysis. Follow up urine culture as well. Has local urologist already, would be fine for discharge and follow up with that physician next available.

## 2015-11-10 LAB — URINE CULTURE: Culture: NO GROWTH

## 2016-07-10 IMAGING — CR DG HIP (WITH OR WITHOUT PELVIS) 2-3V*L*
3 series · 3 of 3 positions shown · non-contrast
Comparison: None.

CLINICAL DATA: Left hip pain, no known injury, initial encounter

EXAM:
DG HIP (WITH OR WITHOUT PELVIS) 2-3V LEFT

[pelvis ap]
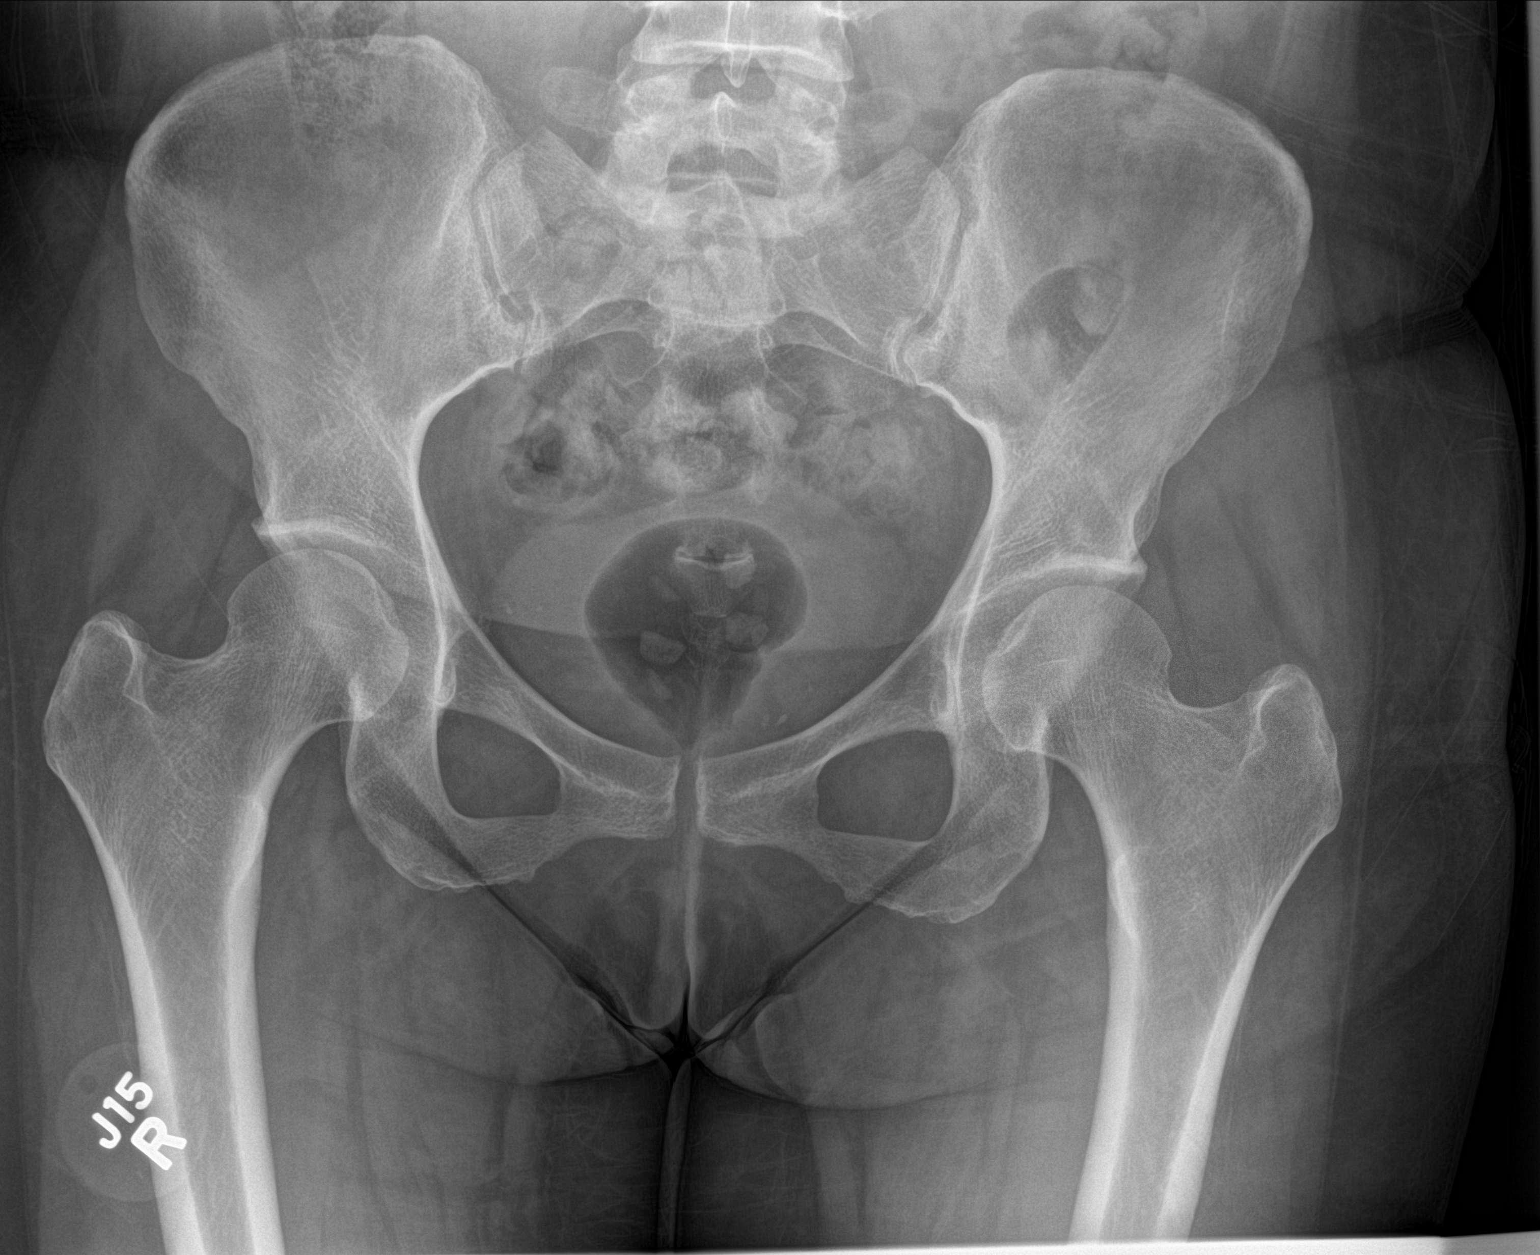

[hip ap]
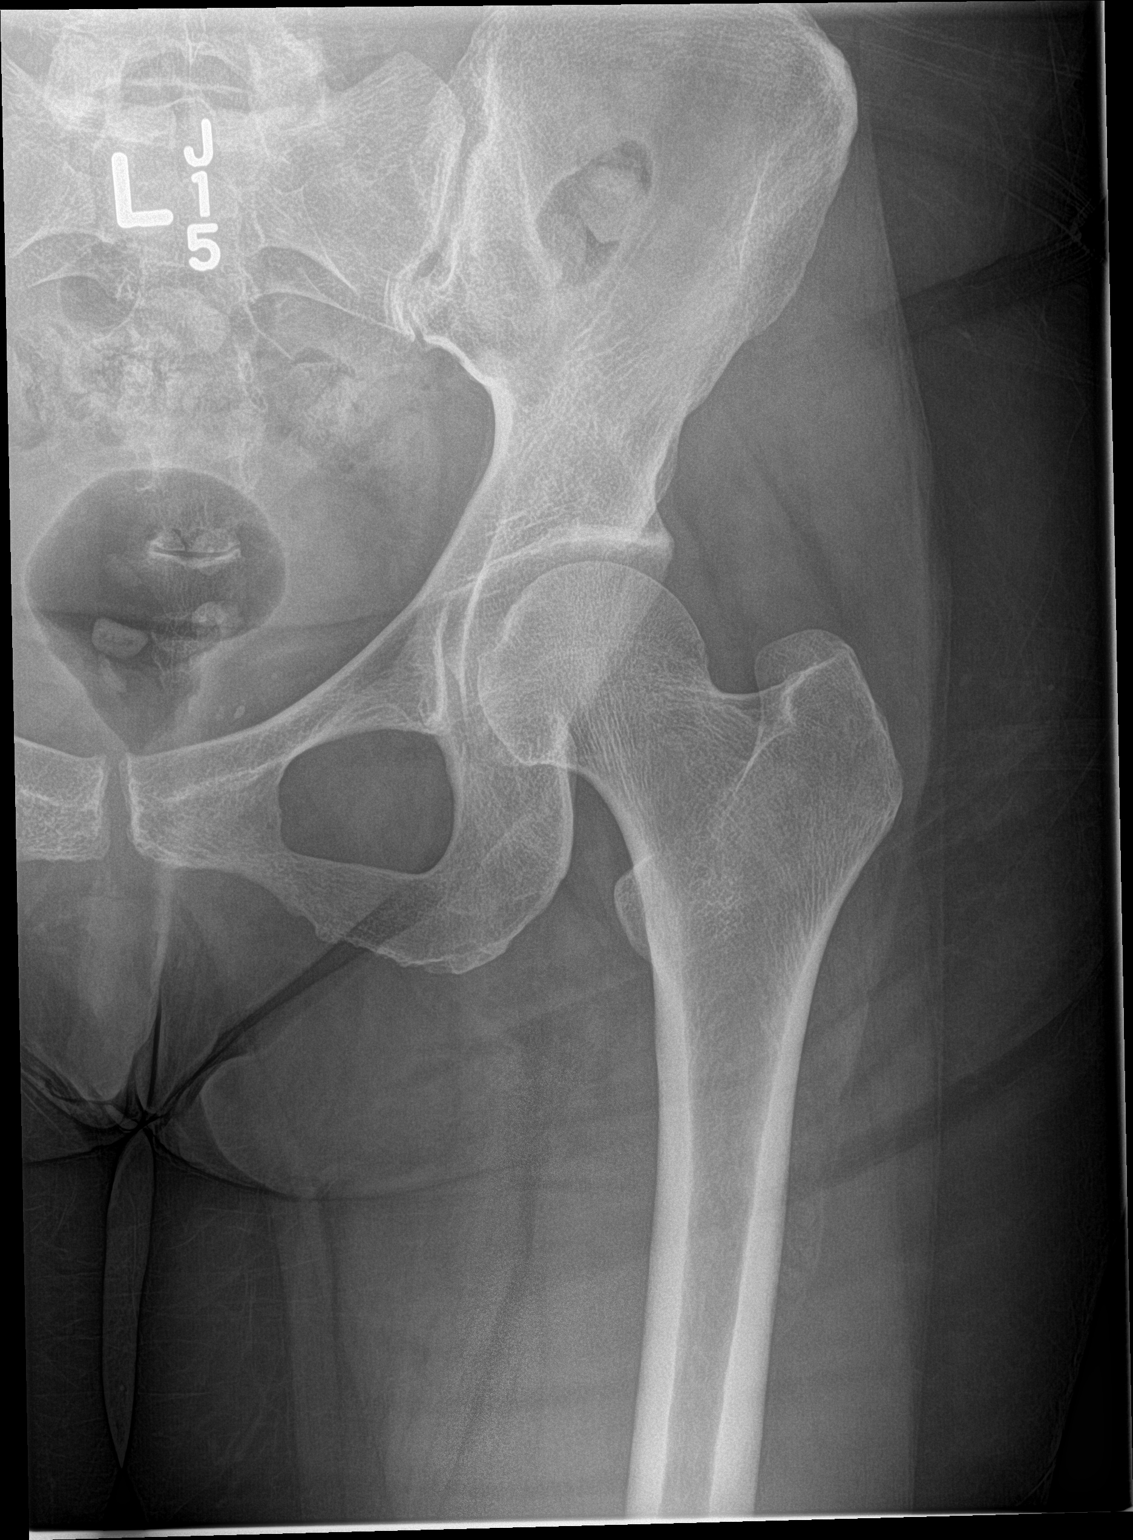

[hip lat]
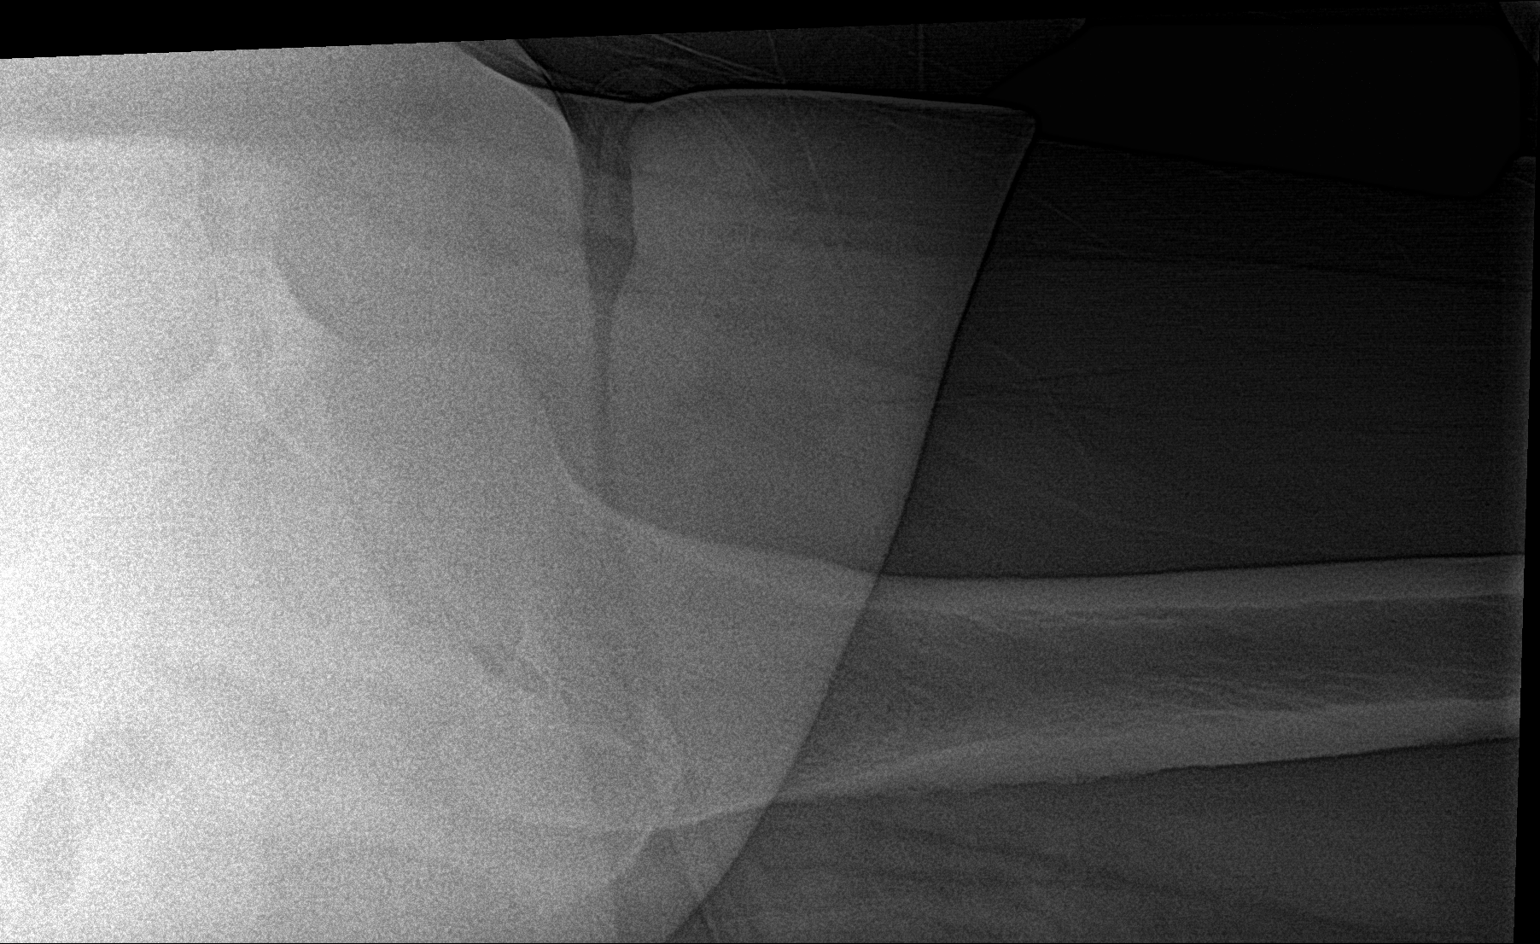

[3 of 3 positions shown; findings below may reference images not displayed]

FINDINGS: The pelvic ring is intact. No acute fracture or dislocation is
noted. No definitive joint effusion is seen. No soft tissue
abnormality is noted.
IMPRESSION: No acute abnormality seen.
